# Patient Record
Sex: Female | Born: 1985 | Hispanic: No | Marital: Married | Smoking: Never smoker
Health system: Southern US, Community
[De-identification: ages and names within clinical notes are randomized; demographics above are authoritative.]

## PROBLEM LIST (undated history)

## (undated) ENCOUNTER — Inpatient Hospital Stay (HOSPITAL_COMMUNITY): Payer: Self-pay

## (undated) DIAGNOSIS — O24419 Gestational diabetes mellitus in pregnancy, unspecified control: Secondary | ICD-10-CM

## (undated) DIAGNOSIS — O039 Complete or unspecified spontaneous abortion without complication: Secondary | ICD-10-CM

## (undated) DIAGNOSIS — E119 Type 2 diabetes mellitus without complications: Secondary | ICD-10-CM

## (undated) DIAGNOSIS — R51 Headache: Secondary | ICD-10-CM

## (undated) HISTORY — DX: Gestational diabetes mellitus in pregnancy, unspecified control: O24.419

## (undated) HISTORY — DX: Complete or unspecified spontaneous abortion without complication: O03.9

## (undated) HISTORY — PX: TONSILLECTOMY: SUR1361

## (undated) HISTORY — DX: Type 2 diabetes mellitus without complications: E11.9

---

## 2010-01-25 DIAGNOSIS — O039 Complete or unspecified spontaneous abortion without complication: Secondary | ICD-10-CM

## 2010-01-25 HISTORY — DX: Complete or unspecified spontaneous abortion without complication: O03.9

## 2010-04-24 ENCOUNTER — Inpatient Hospital Stay (HOSPITAL_COMMUNITY)
Admission: AD | Admit: 2010-04-24 | Discharge: 2010-04-24 | Disposition: A | Discharge status: Home / Self Care | Payer: Self-pay | Source: Ambulatory Visit | Attending: Obstetrics & Gynecology | Admitting: Obstetrics & Gynecology

## 2010-04-24 DIAGNOSIS — O200 Threatened abortion: Secondary | ICD-10-CM | POA: Insufficient documentation

## 2010-04-24 LAB — POCT PREGNANCY, URINE: Preg Test, Ur: POSITIVE

## 2010-04-25 ENCOUNTER — Inpatient Hospital Stay (HOSPITAL_COMMUNITY): Payer: Self-pay

## 2010-04-25 LAB — URINALYSIS, ROUTINE W REFLEX MICROSCOPIC
Bilirubin Urine: NEGATIVE
Ketones, ur: NEGATIVE mg/dL
Nitrite: NEGATIVE
Specific Gravity, Urine: 1.02 (ref 1.005–1.030)
Urobilinogen, UA: 0.2 mg/dL (ref 0.0–1.0)
pH: 7 (ref 5.0–8.0)

## 2010-04-25 LAB — CBC
MCH: 27.9 pg (ref 26.0–34.0)
MCHC: 33.8 g/dL (ref 30.0–36.0)
MCV: 82.6 fL (ref 78.0–100.0)
Platelets: 257 10*3/uL (ref 150–400)
RBC: 4.3 MIL/uL (ref 3.87–5.11)
RDW: 13.6 % (ref 11.5–15.5)

## 2010-04-25 LAB — URINE MICROSCOPIC-ADD ON

## 2010-04-25 LAB — WET PREP, GENITAL
Trich, Wet Prep: NONE SEEN
Yeast Wet Prep HPF POC: NONE SEEN

## 2010-04-25 LAB — ABO/RH: ABO/RH(D): A POS

## 2010-04-26 ENCOUNTER — Inpatient Hospital Stay (HOSPITAL_COMMUNITY)
Admission: AD | Admit: 2010-04-26 | Discharge: 2010-04-26 | Disposition: A | Discharge status: Home / Self Care | Payer: Medicaid Other | Source: Ambulatory Visit | Attending: Family Medicine | Admitting: Family Medicine

## 2010-04-26 DIAGNOSIS — O021 Missed abortion: Secondary | ICD-10-CM | POA: Insufficient documentation

## 2010-04-26 LAB — URINE CULTURE: Colony Count: NO GROWTH

## 2010-04-26 LAB — CBC
MCH: 28.2 pg (ref 26.0–34.0)
MCHC: 33.9 g/dL (ref 30.0–36.0)
MCV: 83 fL (ref 78.0–100.0)
Platelets: 228 10*3/uL (ref 150–400)
RDW: 13.6 % (ref 11.5–15.5)

## 2010-04-27 LAB — GC/CHLAMYDIA PROBE AMP, GENITAL
Chlamydia, DNA Probe: NEGATIVE
GC Probe Amp, Genital: NEGATIVE

## 2010-05-13 ENCOUNTER — Encounter (INDEPENDENT_AMBULATORY_CARE_PROVIDER_SITE_OTHER): Payer: Self-pay | Admitting: Obstetrics and Gynecology

## 2010-05-13 DIAGNOSIS — O039 Complete or unspecified spontaneous abortion without complication: Secondary | ICD-10-CM

## 2010-05-14 NOTE — Group Therapy Note (Signed)
Marissa Carpenter, STANLY                 ACCOUNT NO.:  [id number]  MEDICAL RECORD NO.:  [id number]           PATIENT TYPE:  A  LOCATION:  [facility]                   FACILITY:  [facility]  PHYSICIAN:  Maylon Cos, CNM    DATE OF BIRTH:  11-Sep-1985  DATE OF SERVICE:  05/13/2010                                 CLINIC NOTE  REASON FOR TODAY'S VISIT:  Followup MAU for miscarriage.  HISTORY OF PRESENT ILLNESS:  The patient is a 25 year old Muslim female who presents approximately 3 weeks after being seen in Maternity Admissions for inevitable SAB at 8 weeks and 5 days.  At that time, she was seen by myself and was given Cytotec 800 mcg.  She is A positive. She reports that approximately 2-1/2 hours after taking the Cytotec in MAU that she passed a large amount of tissue.  Her bleeding continued for approximately 3 days after passing tissue and she has had no bleeding since.  She states that she is emotionally healing well and has good support of her husband.  She does desire future pregnancy but is not in any hurry.  PHYSICAL EXAMINATION:  GENERAL:  Marissa Carpenter is a pleasant female, in no apparent distress. VITAL SIGNS:  Her vital signs are stable.  Her pulse is 84, her blood pressure is 98/70, her weight is 161.9, her height is 63 inches. HEENT:  Grossly normal with good dentition. ABDOMEN:  Soft and nontender. GENITALIA:  External genitalia without lesions.  Mucous membranes are pink without lesions.  She has a shaved perineum.  Irregular rugae with moderate tone.  She has a smooth cervix with no lesions.  She has a scant amount of creamy discharge without odor.  Bimanual exam reveals no cervical motion tenderness.  Not enlarged, nontender uterus.  Not enlarged and nontender adnexa. EXTREMITIES:  Equal and reactive bilaterally without edema.  ASSESSMENT:  Complete spontaneous abortion with Cytotec, doing well.  PLAN:  We will obtain a quant today to assess for full completion.  The patient is  doing well and should follow up in 3 weeks to assess of quant if necessary.  The patient will be called with results.  The patient may follow up in GYN Clinic as needed for GYN care, encouraged to continue daily prenatal vitamins, encouraged to delay conception until 3 normal periods.  She verbalizes understanding of this plan and in agreement.          ______________________________ Maylon Cos, CNM    SS/MEDQ  D:  05/13/2010  T:  05/14/2010  Job:  [id number]

## 2010-05-26 DEATH — deceased

## 2010-06-03 ENCOUNTER — Other Ambulatory Visit: Payer: Self-pay

## 2010-06-05 ENCOUNTER — Other Ambulatory Visit: Payer: Self-pay

## 2010-06-05 DIAGNOSIS — Z0189 Encounter for other specified special examinations: Secondary | ICD-10-CM

## 2010-07-19 ENCOUNTER — Inpatient Hospital Stay (HOSPITAL_COMMUNITY)
Admission: AD | Admit: 2010-07-19 | Discharge: 2010-07-19 | Disposition: A | Discharge status: Home / Self Care | Payer: Medicaid Other | Source: Ambulatory Visit | Attending: Obstetrics & Gynecology | Admitting: Obstetrics & Gynecology

## 2010-07-19 ENCOUNTER — Inpatient Hospital Stay (HOSPITAL_COMMUNITY): Payer: Medicaid Other

## 2010-07-19 DIAGNOSIS — O2390 Unspecified genitourinary tract infection in pregnancy, unspecified trimester: Secondary | ICD-10-CM

## 2010-07-19 DIAGNOSIS — O208 Other hemorrhage in early pregnancy: Secondary | ICD-10-CM | POA: Insufficient documentation

## 2010-07-19 DIAGNOSIS — N390 Urinary tract infection, site not specified: Secondary | ICD-10-CM

## 2010-07-19 LAB — WET PREP, GENITAL: Yeast Wet Prep HPF POC: NONE SEEN

## 2010-07-19 LAB — URINE MICROSCOPIC-ADD ON

## 2010-07-19 LAB — URINALYSIS, ROUTINE W REFLEX MICROSCOPIC
Nitrite: NEGATIVE
Protein, ur: NEGATIVE mg/dL
Specific Gravity, Urine: >1.03 — ABNORMAL HIGH (ref 1.005–1.030)
Urobilinogen, UA: 0.2 mg/dL (ref 0.0–1.0)

## 2010-07-19 LAB — POCT PREGNANCY, URINE: Preg Test, Ur: POSITIVE

## 2010-07-20 LAB — URINE CULTURE: Colony Count: 25000

## 2010-07-21 LAB — GC/CHLAMYDIA PROBE AMP, GENITAL: Chlamydia, DNA Probe: NEGATIVE

## 2010-08-01 ENCOUNTER — Inpatient Hospital Stay (HOSPITAL_COMMUNITY): Payer: Self-pay | Admitting: Family Medicine

## 2010-08-01 ENCOUNTER — Inpatient Hospital Stay (HOSPITAL_COMMUNITY)
Admission: AD | Admit: 2010-08-01 | Discharge: 2010-08-01 | Disposition: A | Discharge status: Home / Self Care | Payer: Self-pay | Source: Ambulatory Visit | Attending: Family Medicine | Admitting: Family Medicine

## 2010-08-01 ENCOUNTER — Encounter (HOSPITAL_COMMUNITY): Payer: Self-pay

## 2010-08-01 DIAGNOSIS — O209 Hemorrhage in early pregnancy, unspecified: Secondary | ICD-10-CM

## 2010-08-01 NOTE — ED Provider Notes (Signed)
History     Chief Complaint  Patient presents with  . Vaginal Bleeding    bleeding started on Thursday lighter than a period  . Abdominal Pain   Patient is a 25 y.o. female presenting with vaginal bleeding and abdominal pain. The history is provided by the patient and the spouse. The history is limited by a language barrier.  Vaginal Bleeding This is a recurrent (Pt has known large subchorionic hemorrhage from U/S 6/24.) problem. The current episode started in the past 7 days. The problem occurs 2 to 4 times per day. The problem has been gradually worsening. Associated symptoms include abdominal pain. Pertinent negatives include no chills or fever. Associated symptoms comments: cramping. The symptoms are aggravated by nothing.  Abdominal Pain The primary symptoms of the illness include abdominal pain and vaginal bleeding. The primary symptoms of the illness do not include fever or dysuria.  Additional symptoms associated with the illness include heartburn. Symptoms associated with the illness do not include chills, urgency or frequency.       History reviewed. No pertinent past medical history.  History reviewed. No pertinent past surgical history.  No family history on file.  History  Substance Use Topics  . Smoking status: Never Smoker   . Smokeless tobacco: Not on file  . Alcohol Use: No    Allergies: No Known Allergies  Prescriptions prior to admission  Medication Sig Dispense Refill  . prenatal vitamin w/FE, FA (PRENATAL 1 + 1) 27-1 MG TABS Take 1 tablet by mouth daily.          Review of Systems  Constitutional: Negative for fever and chills.  Gastrointestinal: Positive for heartburn and abdominal pain.  Genitourinary: Positive for vaginal bleeding. Negative for dysuria, urgency and frequency.       Red bleeding   Physical Exam   Blood pressure 136/69, pulse 87, temperature 98.4 F (36.9 C), temperature source Oral, resp. rate 18, height 5\' 4"  (1.626 m), weight  174 lb (78.926 kg), last menstrual period 05/22/2010.  Physical Exam  Constitutional: She appears well-developed and well-nourished.  GI: Soft. She exhibits no mass. There is no rebound and no guarding.  Genitourinary: There is no rash, tenderness, lesion or injury on the right labia. There is no rash, tenderness, lesion or injury on the left labia. Uterus is enlarged. Right adnexum displays no mass. Left adnexum displays no mass. There is bleeding around the vagina.       Cervix closed, no active bleeding  Neurological: She is alert.  Skin: Skin is warm, dry and intact.  Psychiatric: Her mood appears anxious.    MAU Course  Procedures  MDM Bedside ultrasound + cardiac activity.Pt blood type is A+.  Pelvic rest, awaiting Medicaid to start [facility] with [facility].   Avon Gully. Mariely Mahr 08/01/10 1818  Avon Gully. Jasia Hiltunen 08/01/10 1824  Avon Gully. Catrina Fellenz 08/01/10 1830

## 2010-08-01 NOTE — ED Notes (Signed)
Unable to hear FHT's with doppler. 

## 2010-08-01 NOTE — Initial Assessments (Signed)
Prior to first prenatal appt., Pt. And husb. Advised to return to MAU if she experiences heavy bleeding or mod-severe pain.

## 2010-08-01 NOTE — Initial Assessments (Signed)
Pt. States she has bleeding that has increased, enough to come out on to pad or into toilet. Currently very small amt. Pt. States is less than what she observed earlier today.

## 2010-08-01 NOTE — ED Notes (Signed)
+   cardiac activity noted per bedside U/S by K. Tiburcio Pea, NP

## 2010-08-01 NOTE — Progress Notes (Signed)
Bleeding more today wearing the first pad put on

## 2010-08-03 ENCOUNTER — Inpatient Hospital Stay (HOSPITAL_COMMUNITY)
Admission: AD | Admit: 2010-08-03 | Discharge: 2010-08-03 | Disposition: A | Discharge status: Home / Self Care | Payer: Medicaid Other | Source: Ambulatory Visit | Attending: Obstetrics & Gynecology | Admitting: Obstetrics & Gynecology

## 2010-08-03 ENCOUNTER — Encounter (HOSPITAL_COMMUNITY): Payer: Self-pay | Admitting: *Deleted

## 2010-08-03 ENCOUNTER — Inpatient Hospital Stay (HOSPITAL_COMMUNITY): Admission: AD | Admit: 2010-08-03 | Payer: Self-pay | Source: Ambulatory Visit | Admitting: Obstetrics & Gynecology

## 2010-08-03 ENCOUNTER — Inpatient Hospital Stay (HOSPITAL_COMMUNITY): Payer: Medicaid Other

## 2010-08-03 DIAGNOSIS — O021 Missed abortion: Secondary | ICD-10-CM

## 2010-08-03 DIAGNOSIS — O200 Threatened abortion: Secondary | ICD-10-CM | POA: Insufficient documentation

## 2010-08-03 MED ORDER — PROMETHAZINE HCL 25 MG PO TABS: 25.0000 mg | ORAL_TABLET | Freq: Four times a day (QID) | ORAL | Status: AC | PRN

## 2010-08-03 MED ORDER — MISOPROSTOL 200 MCG PO TABS
800.0000 ug | ORAL_TABLET | Freq: Once | ORAL | Status: AC
  Administered 2010-08-03: 800 ug via VAGINAL
  Filled 2010-08-03: qty 4

## 2010-08-03 MED ORDER — OXYCODONE-ACETAMINOPHEN 5-325 MG PO TABS: 1.0000 | ORAL_TABLET | Freq: Four times a day (QID) | ORAL | Status: DC | PRN

## 2010-08-03 MED ORDER — IBUPROFEN 800 MG PO TABS: 800.0000 mg | ORAL_TABLET | Freq: Three times a day (TID) | ORAL | Status: AC | PRN

## 2010-08-03 MED ORDER — MISOPROSTOL 200 MCG PO TABS
ORAL_TABLET | ORAL | Status: AC
  Administered 2010-08-03: 800 ug via VAGINAL
  Filled 2010-08-03: qty 1

## 2010-08-03 NOTE — Progress Notes (Signed)
Was seen in MAU on Saturday for small amount bleeding.  Began bleeding more last night & this morning, feeling dizzy.  Pt has changed one saturated pad this a.m.

## 2010-08-03 NOTE — ED Notes (Signed)
Ultrasound reviewed.  SIUP at 9.2 wk size is visible with no fetal cardiac activity seen. CRL 16mm. Subchorionic collection visualized. Small CL on Right. This is a change from the last [place], indicating fetal demise.   These results were shared with patient and her husband, who translated. Both tearful. They wanted to know why this happened and I explained we do not know why. Discussed options of expectant management versus cytotec. Discussed possiblity of heavy bleeding and increased cramping. Patient and her husband wish to pursue Cytotec treatment. Pt is able to return via pvt car if needed. Last Hgb 12.9 in April.  Per Dr Macon Large, not necessary to repeat CBC, since pt has not had much blood loss to date.   Pt wishes to have cytotec placed here. Will d/c home with Rx for pain meds and nausea medicine. Will have her followup in clinic in 2-3 weeks.

## 2010-08-03 NOTE — Progress Notes (Signed)
Artelia Laroche CNM in to discuss U/S results with pt.

## 2010-08-03 NOTE — ED Provider Notes (Signed)
History     Chief Complaint  Patient presents with  . Vaginal Bleeding  [place] of 1 wk ago showed large [facility] Vaginal Bleeding The patient's primary symptoms include vaginal bleeding. The patient's pertinent negatives include no pelvic pain. This is a new problem. The current episode started yesterday. The problem occurs constantly. The problem has been gradually worsening. The pain is moderate. The problem affects both sides. She is pregnant. Associated symptoms include abdominal pain and back pain. Pertinent negatives include no dysuria.  Presents with increased bleeding since last night.  Pertinent Gynecological History:History reviewed. No pertinent past medical history.  History reviewed. No pertinent past surgical history.  No family history on file.  History  Substance Use Topics  . Smoking status: Never Smoker   . Smokeless tobacco: Not on file  . Alcohol Use: No    Allergies: No Known Allergies  Prescriptions prior to admission  Medication Sig Dispense Refill  . prenatal vitamin w/FE, FA (PRENATAL 1 + 1) 27-1 MG TABS Take 1 tablet by mouth daily.          Review of Systems  Gastrointestinal: Positive for abdominal pain.  Genitourinary: Positive for vaginal bleeding. Negative for dysuria and pelvic pain.  Musculoskeletal: Positive for back pain.   Physical Exam   Blood pressure 117/69, pulse 81, temperature 98.7 F (37.1 C), temperature source Oral, resp. rate 20, height 5' 3.25" (1.607 m), weight 79.379 kg (175 lb), last menstrual period 05/22/2010.  Physical Exam  Constitutional: She appears well-developed and well-nourished.  HENT:  Head: Normocephalic.  Cardiovascular: Normal rate.   Respiratory: She is in respiratory distress.  Genitourinary: Vagina normal.       Mod BRB on pad  Grieving appropraitely  MAU Course  Procedures  MDM A; 1st tr bleed. Probable SAB P:Rpt [place] today. Discussed probable SAB in progress. Above exam done by M. Williams CNM. Bernita Buffy  CNM did documentation only.

## 2010-08-03 NOTE — Progress Notes (Signed)
Pt states she was seen in MAU 7-7 with cramping and bleeding. States bleeding has increased and continues to have lower abdominal cramping. Pt has on a pad that has a 1cm/4cm area of dark red blood, no active bleeding.

## 2010-08-26 ENCOUNTER — Encounter: Payer: Self-pay | Admitting: Advanced Practice Midwife

## 2010-08-26 ENCOUNTER — Ambulatory Visit (INDEPENDENT_AMBULATORY_CARE_PROVIDER_SITE_OTHER): Payer: Self-pay | Admitting: Advanced Practice Midwife

## 2010-08-26 VITALS — BP 109/64 | HR 83 | Temp 98.7°F | Wt 170.4 lb

## 2010-08-26 DIAGNOSIS — O039 Complete or unspecified spontaneous abortion without complication: Secondary | ICD-10-CM

## 2010-08-26 HISTORY — DX: Complete or unspecified spontaneous abortion without complication: O03.9

## 2010-08-26 NOTE — Patient Instructions (Addendum)
Let us know if you want to do the labs for evaluation of recurrent miscarriage.   Wait at least one normal menstrual cycle prior to trying to get pregnant again.   Miscarriage (Spontaneous Miscarriage) A miscarriage is when you lose your baby before the twentieth week of pregnancy. Miscarriages happen in 15-20% of pregnancies. Most miscarriages happen in the first 13 weeks of the pregnancy. In medical terms, this is called a spontaneous miscarriage or early pregnancy loss. No further treatment is needed when the miscarriage is complete and all products of conception have been passed out of the body. You can begin trying for another pregnancy as soon as your caregiver says it is okay. CAUSES  Most causes are not known.  Genetic problems like abnormal, not enough or too many chromosomes.   Infection of the cervix or uterus.   An abnormal shaped uterus, fibroid tumors or congenital abnormalities.   Hormone problems.  Medical problems.   Incompetent cervix, the tissue in the cervix is not strong enough to hold the pregnancy.   Smoking, too much alcohol use and illegal drugs.   Trauma.   SYMPTOMS  Bleeding or spotting from the vagina.   Cramping of the lower abdomen.   Passing of fluid from the vagina with or without cramps or pain.   Passing fetal tissue.  TREATMENT  Sometimes no further treatment is necessary if you pass all the tissue in the uterus.   If partial parts of the fetus or placenta remain in the body (incomplete miscarriage), tissue left behind may become infected. Usually a D and C (Dilatation and Curettage) suction or scrapping of the uterus is necessary to remove the remaining tissue in uterus. The procedure is only done when your caregiver knows that there is no chance for the pregnancy to continue. This is determined by a physical exam, a negative pregnancy test, blood tests and perhaps an ultrasound revealing a dead fetus or no fetus developing because a problem  occurred at conception (when the sperm and egg unite).   Medications may be necessary, antibiotics if there is an infection or medications to contract the uterus if there is a lot of bleeding.   If you have Rh negative blood and your partner is Rh positive, you will need a Rho-gam shot (an immune globulin vaccine). This will protect your baby from having Rh blood problems in future pregnancies.  HOME CARE INSTRUCTIONS  Your caregiver may order bed rest (up to the bathroom only). He or she may allow you to continue light activity. You may need to make arrangements for the care of children and for any other responsibilities.   Keep track of the number of pads you use each day and how soaked  (saturated) they are. Record this information.   Do not use tampons. Do not douche or have sexual intercourse until approved by your caregiver.   Only take over-the-counter or prescription medicines for pain, discomfort or fever as directed by your caregiver.   Do not take aspirin because it can cause bleeding.   It is very important to keep all follow-up appointments for re-evaluations and continuing management.   Tell your caregiver if you are experiencing domestic violence.   Women who have an Rh negative blood type (i.e., A, B, AB, or O negative) need to receive a drug called Rh(D) immune globulin (RhoGam). This medicine helps protect future fetuses against problems that can occur if an Rh negative mother is carrying a baby who is Rh positive.  If you and/or your partner are having problems with guilt or grieving, talk to your caregiver or seek counseling to help you cope with the pregnancy loss. Allow enough time to grieve before trying to get pregnant again.  SEEK IMMEDIATE MEDICAL CARE IF:  You have severe cramps or pain in your stomach, back, or belly (abdomen).   You run an unexplained temperature 100 F (37.9 C) or higher (record these).   You pass large clots or tissue. Save any tissue  for your caregiver to inspect.   Your bleeding increases.   You become light-headed, weak or have fainting episodes.   You develop chills.  Document Released: 07/07/2000 Document Re-Released: 07/01/2009 [facility] Patient Information 2011 [place], [place].

## 2010-08-26 NOTE — Progress Notes (Signed)
  Subjective:    Patient ID: Marissa Carpenter, female    DOB: [birth year], 25 y.o.   MRN: [id number]  HPI G2P0020 s/p SAB at [redacted] wks EGA diagnosed in MAU on 08/03/10. Given Cytotec at that time. Bleeding x 8-9 days following cytotec, no bleeding or pain now. Pt is concerned about having 2 SAB consecutively, would like to investigate the cause of her miscarriages. States that her sister had a similar situation and was found to have a clotting disorder and required injections throughout her pregnancy to maintain the pregnancy. No one else in family has history of clotting disorders or pregnancy losses.    History reviewed. No pertinent past medical history.  Family History  Problem Relation Age of Onset  . Miscarriages / Stillbirths Sister   . Clotting disorder Sister       Review of Systems ROS Review of Systems - Negative except as noted in HPI     Objective:   Physical Exam  Nursing note and vitals reviewed. Constitutional: She is oriented to person, place, and time. She appears well-developed and well-nourished. No distress.  Cardiovascular: Normal rate.   Pulmonary/Chest: Effort normal.  Abdominal: Soft. There is no tenderness.  Musculoskeletal: Normal range of motion.  Neurological: She is alert and oriented to person, place, and time.  Skin: Skin is warm and dry.  Psychiatric: She has a normal mood and affect.   UPT negative       Assessment & Plan:  25 y.o.G3P0020 with complete SAB H/O SAB x 2 - pt is considering eval for recurrent SAB, I discussed with the patient and her husband that we typically do not initiate a work-up until 3 consecutive SAB. The pt is concerned d/t her sister's history of clotting disorder leading to SAB. The pt is self pay, so I provided her with a list of the thrombophilia labs we would usually order and the cost of each of those labs. She is going to decide if she would like to have the labs completed. She is going to attempt to find out from her sister  what specific clotting disorder she has been diagnosed with to see if she can be tested specifically for that problem.  Otherwise, she was counseled to wait at least 1 normal cycle prior to attempting to conceive again RTC PRN

## 2011-01-02 ENCOUNTER — Emergency Department (HOSPITAL_COMMUNITY)
Admission: EM | Admit: 2011-01-02 | Discharge: 2011-01-03 | Disposition: A | Discharge status: Home / Self Care | Payer: Self-pay | Attending: Emergency Medicine | Admitting: Emergency Medicine

## 2011-01-02 ENCOUNTER — Encounter (HOSPITAL_COMMUNITY): Payer: Self-pay | Admitting: *Deleted

## 2011-01-02 DIAGNOSIS — K219 Gastro-esophageal reflux disease without esophagitis: Secondary | ICD-10-CM | POA: Insufficient documentation

## 2011-01-02 DIAGNOSIS — R109 Unspecified abdominal pain: Secondary | ICD-10-CM | POA: Insufficient documentation

## 2011-01-02 NOTE — ED Notes (Signed)
Upper epigastric pain x 1 wk

## 2011-01-02 NOTE — ED Notes (Signed)
Pt c/o abd pain; nausea; diarrhea

## 2011-01-03 ENCOUNTER — Encounter (HOSPITAL_COMMUNITY): Payer: Self-pay | Admitting: *Deleted

## 2011-01-03 LAB — PREGNANCY, URINE: Preg Test, Ur: NEGATIVE

## 2011-01-03 MED ORDER — FAMOTIDINE 20 MG PO TABS: 20.0000 mg | ORAL_TABLET | Freq: Two times a day (BID) | ORAL | Status: DC

## 2011-01-03 MED ORDER — GI COCKTAIL ~~LOC~~
30.0000 mL | Freq: Once | ORAL | Status: AC
  Administered 2011-01-03: 30 mL via ORAL
  Filled 2011-01-03: qty 30

## 2011-01-03 MED ORDER — OMEPRAZOLE 20 MG PO CPDR: 20.0000 mg | DELAYED_RELEASE_CAPSULE | Freq: Every day | ORAL | Status: DC

## 2011-01-03 MED ORDER — FAMOTIDINE 20 MG PO TABS
20.0000 mg | ORAL_TABLET | Freq: Once | ORAL | Status: AC
  Administered 2011-01-03: 20 mg via ORAL
  Filled 2011-01-03: qty 1

## 2011-01-03 NOTE — ED Provider Notes (Signed)
History     CSN: [id number] Arrival date & time: 01/02/2011  9:12 PM   First MD Initiated Contact with Patient 01/03/11 0129      Chief Complaint  Patient presents with  . Abdominal Pain    (Consider location/radiation/quality/duration/timing/severity/associated sxs/prior treatment) Patient is a 25 y.o. female presenting with abdominal pain. The history is provided by the patient. No language interpreter was used.  Abdominal Pain The primary symptoms of the illness include abdominal pain. The primary symptoms of the illness do not include fever, fatigue, shortness of breath, nausea, vomiting, diarrhea or dysuria. The current episode started more than 2 days ago (1 week ago). The onset of the illness was gradual. The problem has been gradually worsening.  The abdominal pain began more than 2 days ago. The pain came on gradually. The abdominal pain has been gradually worsening since its onset. The abdominal pain is located in the epigastric region. The abdominal pain does not radiate. The abdominal pain is relieved by nothing. The abdominal pain is exacerbated by certain positions.  The patient states that she believes she is currently not pregnant. The patient has not had a change in bowel habit. Symptoms associated with the illness do not include chills, constipation, urgency, hematuria, frequency or back pain.    History reviewed. No pertinent past medical history.  History reviewed. No pertinent past surgical history.  Family History  Problem Relation Age of Onset  . Miscarriages / Stillbirths Sister   . Clotting disorder Sister     History  Substance Use Topics  . Smoking status: Never Smoker   . Smokeless tobacco: Not on file  . Alcohol Use: No    OB History    Grav Para Term Preterm Abortions TAB SAB Ect Mult Living   3    2  2    0      Review of Systems  Constitutional: Negative for fever, chills, activity change, appetite change and fatigue.  HENT: Negative for  congestion, sore throat, rhinorrhea, neck pain and neck stiffness.   Respiratory: Negative for cough, chest tightness and shortness of breath.   Cardiovascular: Negative for chest pain and palpitations.  Gastrointestinal: Positive for abdominal pain. Negative for nausea, vomiting, diarrhea and constipation.  Genitourinary: Negative for dysuria, urgency, frequency, hematuria and flank pain.  Musculoskeletal: Negative for back pain.  Neurological: Negative for dizziness, weakness, light-headedness, numbness and headaches.  All other systems reviewed and are negative.    Allergies  Review of patient's allergies indicates no known allergies.  Home Medications   Current Outpatient Rx  Name Route Sig Dispense Refill  . FAMOTIDINE 20 MG PO TABS Oral Take 1 tablet (20 mg total) by mouth 2 (two) times daily. 30 tablet 0  . OMEPRAZOLE 20 MG PO CPDR Oral Take 1 capsule (20 mg total) by mouth daily. 30 capsule 0    BP 103/76  Pulse 79  Temp(Src) 98.8 F (37.1 C) (Oral)  Resp 20  SpO2 100%  LMP 12/11/2010  Physical Exam  Nursing note and vitals reviewed. Constitutional: She is oriented to person, place, and time. She appears well-developed and well-nourished. No distress.  HENT:  Head: Normocephalic and atraumatic.  Mouth/Throat: Oropharynx is clear and moist. No oropharyngeal exudate.  Eyes: Conjunctivae and EOM are normal. Pupils are equal, round, and reactive to light.  Neck: Normal range of motion. Neck supple.  Cardiovascular: Normal rate, regular rhythm, normal heart sounds and intact distal pulses.  Exam reveals no gallop and no friction rub.  No murmur heard. Pulmonary/Chest: Effort normal and breath sounds normal. No respiratory distress.  Abdominal: Soft. Bowel sounds are normal. There is no tenderness. There is no rebound and no guarding.  Musculoskeletal: Normal range of motion. She exhibits no tenderness.  Neurological: She is alert and oriented to person, place, and time.   Skin: Skin is warm and dry. No rash noted.    ED Course  Procedures (including critical care time)   Labs Reviewed  PREGNANCY, URINE   No results found.   1. GERD (gastroesophageal reflux disease)       MDM  Symptoms are consistent with gastroesophageal reflux disease. There is no concern for pancreatitis or additional causes of malignant abdominal pain. I performed a urine pregnancy test per the patient's request. This was negative. She received a GI cocktail and Pepcid the emergency department. She was discharged home with Prilosec and Pepcid instructions to followup with her primary care physician.        Dayton Bailiff, MD 01/03/11 [id number]

## 2011-01-15 ENCOUNTER — Encounter: Payer: Self-pay | Admitting: Family Medicine

## 2011-01-15 ENCOUNTER — Ambulatory Visit (INDEPENDENT_AMBULATORY_CARE_PROVIDER_SITE_OTHER): Payer: Self-pay | Admitting: Family Medicine

## 2011-01-15 DIAGNOSIS — R198 Other specified symptoms and signs involving the digestive system and abdomen: Secondary | ICD-10-CM

## 2011-01-15 DIAGNOSIS — Z8759 Personal history of other complications of pregnancy, childbirth and the puerperium: Secondary | ICD-10-CM

## 2011-01-15 DIAGNOSIS — Z8742 Personal history of other diseases of the female genital tract: Secondary | ICD-10-CM

## 2011-01-15 DIAGNOSIS — R21 Rash and other nonspecific skin eruption: Secondary | ICD-10-CM | POA: Insufficient documentation

## 2011-01-15 DIAGNOSIS — K219 Gastro-esophageal reflux disease without esophagitis: Secondary | ICD-10-CM | POA: Insufficient documentation

## 2011-01-15 DIAGNOSIS — E669 Obesity, unspecified: Secondary | ICD-10-CM

## 2011-01-15 DIAGNOSIS — B354 Tinea corporis: Secondary | ICD-10-CM

## 2011-01-15 DIAGNOSIS — R197 Diarrhea, unspecified: Secondary | ICD-10-CM

## 2011-01-15 DIAGNOSIS — I10 Essential (primary) hypertension: Secondary | ICD-10-CM

## 2011-01-15 LAB — LIPID PANEL
HDL: 42 mg/dL (ref >39)
LDL Cholesterol: 110 mg/dL — ABNORMAL HIGH (ref 0–99)
Total CHOL/HDL Ratio: 4.8 Ratio
Triglycerides: 251 mg/dL — ABNORMAL HIGH (ref <150)

## 2011-01-15 MED ORDER — FLUCONAZOLE 150 MG PO TABS: 150.0000 mg | ORAL_TABLET | ORAL | Status: AC

## 2011-01-15 MED ORDER — OXICONAZOLE NITRATE 1 % EX CREA: 1.0000 | TOPICAL_CREAM | Freq: Two times a day (BID) | CUTANEOUS | Status: DC

## 2011-01-15 NOTE — Assessment & Plan Note (Signed)
Post-prandial epigastric pain past few weeks alleviated by Pepcid and Prilosec. Since she still has occasional discomfort and persistent mild nausea, advised her to take Prilosec on a regular basis for the next month and asked her to follow-up at that time.  Will check TSH and FLP today.

## 2011-01-15 NOTE — Progress Notes (Signed)
  Subjective:    Patient ID: Marissa Carpenter, female    DOB: [birth year], 25 y.o.   MRN: [id number]  HPI This is a 25 YO [place] female who speaks primary Arabic (and needs English interpretor) presenting for her initial visit.   1. GERD Went to the hospital recently and diagnosed with this. Symptoms: epigastric pain after eating x few weeks; nausea x 1 month usually associated with eating but sometimes not; lightheadedness Alleviated by: Pepcid and Prilosec which she stopped taking due to improvement of symptoms Progression: improved  2. Circular red lesion on back of neck Duration: 2 months Progression: she feels like it is growing Symptoms: itchy ROS: denies fever; no other lesions  Review of Systems Per HPI    Objective:   Physical Exam Gen: NAD, overweight, accompanied by husband Psych: pleasant, seems to understand what I am saying and responds occasionally but defers most responses to husband who is pleasant CV: RRR, no m/r/g Pulm: CTAB, no w/r/r Abd: NABS, soft, mild lower abdominal tenderness bilaterally (this has been going on "for a while") without epigastric tenderness or guarding/rebound Ext: no edema Skin: annular, erythematous, non-scaly lesion with area of central clearing at left nape of neck, slightly raised    Assessment & Plan:

## 2011-01-15 NOTE — Assessment & Plan Note (Signed)
Annular erythematous pruritic lesion left nape of neck past few months. Consistent with and will treat as tinea corporis with topical and systemic anti-fungals. Follow-up in 1 month.

## 2011-01-15 NOTE — Patient Instructions (Signed)
I think the rash on your neck is due to fungal infection. Take the pill once a week for 2-4 weeks (until the rash goes away).  Use the cream twice a day until the rash goes away.  Stop the Pepcid but take the Prilosec every day for the next month.  Follow-up with me in 3-4 weeks for a pap smear and to follow-up on the rash. We will talk about your lab results at that time (if they are normal). If they are abnormal, I will call you.

## 2011-01-28 ENCOUNTER — Ambulatory Visit: Payer: Self-pay | Admitting: Family Medicine

## 2011-02-18 ENCOUNTER — Ambulatory Visit: Payer: Self-pay | Admitting: Family Medicine

## 2011-03-04 ENCOUNTER — Ambulatory Visit (INDEPENDENT_AMBULATORY_CARE_PROVIDER_SITE_OTHER): Payer: Self-pay | Admitting: Family Medicine

## 2011-03-04 ENCOUNTER — Encounter: Payer: Self-pay | Admitting: Family Medicine

## 2011-03-04 ENCOUNTER — Other Ambulatory Visit (HOSPITAL_COMMUNITY)
Admission: RE | Admit: 2011-03-04 | Discharge: 2011-03-04 | Disposition: A | Discharge status: Home / Self Care | Payer: Self-pay | Source: Ambulatory Visit | Attending: Family Medicine | Admitting: Family Medicine

## 2011-03-04 VITALS — BP 108/69 | HR 80 | Temp 98.1°F | Wt 181.0 lb

## 2011-03-04 DIAGNOSIS — Z23 Encounter for immunization: Secondary | ICD-10-CM

## 2011-03-04 DIAGNOSIS — Z124 Encounter for screening for malignant neoplasm of cervix: Secondary | ICD-10-CM

## 2011-03-04 DIAGNOSIS — Z0000 Encounter for general adult medical examination without abnormal findings: Secondary | ICD-10-CM | POA: Insufficient documentation

## 2011-03-04 DIAGNOSIS — B354 Tinea corporis: Secondary | ICD-10-CM

## 2011-03-04 DIAGNOSIS — Z01419 Encounter for gynecological examination (general) (routine) without abnormal findings: Secondary | ICD-10-CM | POA: Insufficient documentation

## 2011-03-04 DIAGNOSIS — E785 Hyperlipidemia, unspecified: Secondary | ICD-10-CM | POA: Insufficient documentation

## 2011-03-04 MED ORDER — HYDROCORTISONE 0.5 % EX OINT: TOPICAL_OINTMENT | Freq: Two times a day (BID) | CUTANEOUS | Status: DC

## 2011-03-04 NOTE — Assessment & Plan Note (Signed)
Mildly elevated. No other risk factors.  No medications at this time.  Lifestyle modifications. Encouraged 10% (10-18 pound) weight loss.

## 2011-03-04 NOTE — Assessment & Plan Note (Signed)
Pap today.  Meeting with Rosalita Chessman to discuss elevated cholesterol and lifestyle modification. Patient seems interested.

## 2011-03-04 NOTE — Progress Notes (Signed)
OBESITY Current weight/BMI : 31.05   How long have been obese:  2 + yrs Course:  Somewhat worsening Problems or symptoms it causes:  Pre-hyperlipidemia   Things have tried to improve:  To be determined  Patient Identified Concern: high cholesterol Stage of Change Patient Is In:  Contemplations, will make changes in next 6 months. Patient Reported Barriers:  Language, lack of knowledge, habits Patient Reported Perceived Benefits:  Better health Patient Reports Self-Efficacy:   To be determined Behavior Change Supports:  Husband will help her make changes Goals:  To limit fried foods, focus on eating vegetables, lean proteins, drinking more water, limiting soda/teas, and to move more.  Patient Education:  We discussed what her total, ldl, hdl levels where and ways to improve her nubmers including diet, exercise, fish oil supplements.

## 2011-03-04 NOTE — Progress Notes (Signed)
  Subjective:    Patient ID: Marissa Carpenter, female    DOB: 09-Feb-1985, 26 y.o.   MRN: [id number]  HPI Follow-up of neck rash.  Still itchy but thinks rash is going down. Using oxiconazole almost every day, twice a day. Finished 1 month, q week course of Diflucan.  2. Pap smear. Wants to get pregnant. Regular monthly periods. Last period middle of last month.  No history of abnormal Paps.   Review of Systems    Objective:   Physical Exam Gen: NAD Psych: pleasant, accompanied by husband Skin: lesion is less annular and erythematous and more irregular, fainter border on left-side; non-scaly; no drainage; about 3 x 2 cm GU: vulva, vagina normal; cervix has lighter area in os; some bleeding with Pap exam    Assessment & Plan:

## 2011-03-04 NOTE — Patient Instructions (Signed)
For the rash, try the hydrocortisone ointment twice daily until the rash goes away.  Also use vaseline on top of the ointment twice daily.  I think you have eczema.  If your lab results are normal, I will send you a letter with the results. If abnormal, someone at the clinic will get in touch with you.   Follow-up in 2-4 weeks if your rash does not improve or gets worse.  Otherwise, follow-up in 1 year for your next physical. We will check your cholesterol at that time.   Meet with our Health Coach Arlys John about ways to reduce your cholesterol.

## 2011-03-04 NOTE — Assessment & Plan Note (Signed)
May be eczema. Anti-fungals topical and systemic helped some but itchiness persistent.  Low-dose hydrocortisone ointment (0.5%), vaseline bid.  Follow-up in 2-4 weeks if not improved.

## 2011-03-05 ENCOUNTER — Encounter: Payer: Self-pay | Admitting: Family Medicine

## 2011-04-09 ENCOUNTER — Encounter: Payer: Self-pay | Admitting: Family Medicine

## 2011-04-09 ENCOUNTER — Other Ambulatory Visit (HOSPITAL_COMMUNITY)
Admission: RE | Admit: 2011-04-09 | Discharge: 2011-04-09 | Disposition: A | Discharge status: Home / Self Care | Payer: Self-pay | Source: Ambulatory Visit | Attending: Family Medicine | Admitting: Family Medicine

## 2011-04-09 ENCOUNTER — Ambulatory Visit (INDEPENDENT_AMBULATORY_CARE_PROVIDER_SITE_OTHER): Payer: Self-pay | Admitting: Family Medicine

## 2011-04-09 VITALS — BP 111/72 | HR 80 | Temp 98.1°F | Ht 64.0 in | Wt 182.0 lb

## 2011-04-09 DIAGNOSIS — N390 Urinary tract infection, site not specified: Secondary | ICD-10-CM

## 2011-04-09 DIAGNOSIS — Z113 Encounter for screening for infections with a predominantly sexual mode of transmission: Secondary | ICD-10-CM | POA: Insufficient documentation

## 2011-04-09 DIAGNOSIS — N912 Amenorrhea, unspecified: Secondary | ICD-10-CM

## 2011-04-09 DIAGNOSIS — R109 Unspecified abdominal pain: Secondary | ICD-10-CM

## 2011-04-09 LAB — POCT URINALYSIS DIPSTICK
Bilirubin, UA: NEGATIVE
Blood, UA: NEGATIVE
Ketones, UA: NEGATIVE
Nitrite, UA: NEGATIVE
Protein, UA: NEGATIVE
pH, UA: 7

## 2011-04-09 MED ORDER — NITROFURANTOIN MONOHYD MACRO 100 MG PO CAPS: 100.0000 mg | ORAL_CAPSULE | Freq: Two times a day (BID) | ORAL | Status: AC

## 2011-04-09 NOTE — Assessment & Plan Note (Signed)
It was difficult understanding why patient was here today. I evaluated her for a possible UTI versus vaginitis, however, I think her real reason was concern about whether her having periods every 5-weeks instead of every 4-weeks put her at increased risk of spontaneous abortions, which have happened a few times to her. I advised her to make a visit with me to discuss her wanting to get pregnant since I was not able to elicit this until the end of the visit. I think she may have a possible urinary tract infection. Positive leuk esterase but negative nitrates. Her wet prep (read by me, so not as accurate as usual when read by lab technician) showed significant GNR and numerous WBC. Due to her nausea and suprapubic pain, I will treat as UTI with Macrobid x 7 days. Will also get urine culture.

## 2011-04-09 NOTE — Progress Notes (Signed)
  Subjective:    Patient ID: Marissa Carpenter, female    DOB: 09/06/1985, 26 y.o.   MRN: [id number]  HPI Acute visit: right lower pelvic pain, concerned period may not be coming Last period February 24th.  Period every 5 weeks.  Was wondering if we could tell her if her period is going to come.  Also having right-sided pelvic pain. Concerned about infection.  Sexually active with husband only. Some greenish colored discharge. No vaginal irritation.  No fevers. Episode of nausea with vomiting sputum 2 days ago. No problems taking PO now.   Review of Systems Per HPI    Objective:   Physical Exam Gen: NAD, accompanied by interpreter Abd: NABS, soft, mild TTP focal area right lower abdomen, right upper pelvis GU: vulva, vagina normal without erythema, scant white discharge in posterior vault, no CVA tenderness, no cervical discharge including pus or blood    Assessment & Plan:

## 2011-04-09 NOTE — Patient Instructions (Signed)
I think you may have a urine infection.  Take the antibiotic twice a day for 7 days.  If you would to talk about getting pregnant, please make an appointment to discuss this. Otherwise follow-up as needed.

## 2011-04-11 LAB — URINE CULTURE

## 2011-04-14 ENCOUNTER — Encounter: Payer: Self-pay | Admitting: Family Medicine

## 2011-04-28 ENCOUNTER — Telehealth: Payer: Self-pay | Admitting: Family Medicine

## 2011-04-28 NOTE — Telephone Encounter (Signed)
Husband is calling because his wife was in 3/15 and was told that they would receive a call about some more testing that needed to be done, but no one has called them back yet.

## 2011-04-29 NOTE — Telephone Encounter (Signed)
Spoke with patient's husband. At the last visit, I asked patient to call and make an appointment with me to discuss her infertility since she mentioned this concern at the end of her visit.  I asked patient's husband is referral to Ob/Gyn preferred, but he said they would follow-up with me and would call and make appointment.

## 2011-04-30 ENCOUNTER — Ambulatory Visit (INDEPENDENT_AMBULATORY_CARE_PROVIDER_SITE_OTHER): Payer: Self-pay | Admitting: Family Medicine

## 2011-04-30 VITALS — BP 108/72 | HR 78 | Temp 98.6°F | Ht 64.0 in | Wt 184.2 lb

## 2011-04-30 DIAGNOSIS — N96 Recurrent pregnancy loss: Secondary | ICD-10-CM | POA: Insufficient documentation

## 2011-04-30 DIAGNOSIS — N979 Female infertility, unspecified: Secondary | ICD-10-CM

## 2011-04-30 MED ORDER — PRENATAL VITAMINS (DIS) PO TABS: 1.0000 | ORAL_TABLET | Freq: Every day | ORAL | Status: DC

## 2011-04-30 NOTE — Patient Instructions (Addendum)
Make an appointment next week to discuss your lab results.  Start taking a prenatal vitamins.

## 2011-04-30 NOTE — Progress Notes (Signed)
  Subjective:    Patient ID: Marissa Carpenter, female    DOB: 11/30/1985, 26 y.o.   MRN: [id number]  HPI Initial visit due to concern about ability to have children.  Patient has had 2 recent miscarriages.  She has a sister who has a blood disorder that required injections during her pregnancy.  Patient has never been on birth control and has been actively trying to conceive. She and her husband have intercourse 3-4 times a week.   Review of Systems    Objective:   Physical Exam Gen: NAD; overweight; accompanied by husband HEENT: no facial hair or acne CV: RRR Pulm: NI WOB Abd: soft, NT, obese, non-distended Ext: no edema Skin: no rash; warm, dry    Assessment & Plan:

## 2011-04-30 NOTE — Assessment & Plan Note (Addendum)
Summary of obstetrical history:   04/24/2010 G1 @ ~9wks. Presented to [facility] and diagnosed with inevitable miscarriage.   07/19/2010 G2P0010 @ 8wks. Presented to [facility], and U/S showed subchorionic hemorrhage.   08/03/2010 Spontaneous abortion. Patient has been seen at [facility] following her second spontaneous abortion. The patient has a sister with a blood disorder requiring injections during pregnancy, and since the patient had limited finances, a thrombophilia work-up was not done at that time. The plan was for the patient to ask her sister regarding her diagnosis to narrow the work-up laboratory work to make it more financially feasible for the patient and her family.  Patient presents today due to concerns about the ability to have children. The patient is not infertile. She has conceived twice and has regular monthly periods. Of note, semen analysis of her husband was also normal; TSH, HgbA1c were also WNL recently. She does not smoke tobacco and does not have signs of PCOS.  Will get thrombophilia work-up today since patient now has orange card>>>normal except for elevated protein C. 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   Follow-up in 1 week to discuss results.

## 2011-05-05 LAB — HYPERCOAGULABLE PANEL, COMPREHENSIVE
AntiThromb III Func: 102 % (ref 76–126)
Anticardiolipin IgG: 4 GPL U/mL (ref <23)
Beta-2 Glyco I IgG: 4 G Units (ref <20)
Beta-2-Glycoprotein I IgA: 3 A Units (ref <20)
Beta-2-Glycoprotein I IgM: 12 M Units (ref <20)
Lupus Anticoagulant: NOT DETECTED
Protein C, Total: 131 % (ref 72–160)
Protein S Total: 102 % (ref 60–150)

## 2011-05-07 ENCOUNTER — Encounter: Payer: Self-pay | Admitting: Family Medicine

## 2011-05-07 ENCOUNTER — Ambulatory Visit (INDEPENDENT_AMBULATORY_CARE_PROVIDER_SITE_OTHER): Payer: Self-pay | Admitting: Family Medicine

## 2011-05-07 VITALS — BP 123/77 | HR 98 | Ht 64.0 in | Wt 181.0 lb

## 2011-05-07 DIAGNOSIS — N96 Recurrent pregnancy loss: Secondary | ICD-10-CM

## 2011-05-07 NOTE — Assessment & Plan Note (Addendum)
This is a 26 year old female from [place] who presents today for follow-up of her thrombophilia work-up, which was negative.  The only abnormality was an elevated protein C, which does not increase (and should actually decrease) her risk of VTE. The patient's family history of significant for mother and one sister with two spontaneous abortions each and a sister with thyroid problems. The patient's TSH done 08/2010 was WNL at 1.6. Of note, her lupus anticoagulant, HgbA1c, and INR were also WNL. Also, her husband' semen analysis performed at [facility] on 09/2010 showed adequate sperm. Informally discussed this patient with Ob/Gyn attending Dr. Jolayne Panther who recommended monitoring the patient during next pregnancy versus referring the patient and her husband for genetic testing. Discussed options with patient who would like genetic testing done at this time. The earliest appointment that can be made is September 2013. Patient does not want contraception in the interim. Follow-up prn.

## 2011-05-07 NOTE — Patient Instructions (Addendum)
We will refer you to see Maternal Fetal Medicine for genetic evaluation and will call you with the appointment.

## 2011-05-07 NOTE — Progress Notes (Signed)
  Subjective:    Patient ID: Marissa Carpenter, female    DOB: 09/08/1985, 26 y.o.   MRN: [id number]  HPI This is a 26 year old female with [place] who presents today for follow-up of her thrombophilia work-up, which was negative.  No new complaints.   Patient has a family history of spontaneous abortion in her mother and sister who lost 2 pregnancies in utero. Her mother ended up having 5 children. Her sister was able to carry subsequent deliveries to full term after receiving "injections" during her pregnancy. The patient is unsure what the medication was and the name of her sister's condition. The patient has 2 other sisters who are healthy and have had no problems during their pregnancies or deliveries. She also has 2 brothers whose wives have not lost any pregnancies.  The patient's family history is also significant for thyroid problems in a sister.  The patient has no history of abdominal surgeries.   Review of Systems    Objective:   Physical Exam Gen: NAD; accompanied by [place] interpreter Psych: engaged, appropriate, not anxious or depressed appearing    Assessment & Plan:

## 2011-05-25 ENCOUNTER — Emergency Department (HOSPITAL_COMMUNITY): Payer: No Typology Code available for payment source

## 2011-05-25 ENCOUNTER — Encounter (HOSPITAL_COMMUNITY): Payer: Self-pay

## 2011-05-25 ENCOUNTER — Emergency Department (HOSPITAL_COMMUNITY)
Admission: EM | Admit: 2011-05-25 | Discharge: 2011-05-25 | Disposition: A | Discharge status: Home / Self Care | Payer: No Typology Code available for payment source | Attending: Emergency Medicine | Admitting: Emergency Medicine

## 2011-05-25 DIAGNOSIS — M546 Pain in thoracic spine: Secondary | ICD-10-CM | POA: Insufficient documentation

## 2011-05-25 DIAGNOSIS — T148XXA Other injury of unspecified body region, initial encounter: Secondary | ICD-10-CM | POA: Insufficient documentation

## 2011-05-25 DIAGNOSIS — M545 Low back pain, unspecified: Secondary | ICD-10-CM | POA: Insufficient documentation

## 2011-05-25 DIAGNOSIS — R1084 Generalized abdominal pain: Secondary | ICD-10-CM | POA: Insufficient documentation

## 2011-05-25 DIAGNOSIS — M25559 Pain in unspecified hip: Secondary | ICD-10-CM | POA: Insufficient documentation

## 2011-05-25 DIAGNOSIS — M25519 Pain in unspecified shoulder: Secondary | ICD-10-CM | POA: Insufficient documentation

## 2011-05-25 DIAGNOSIS — M542 Cervicalgia: Secondary | ICD-10-CM | POA: Insufficient documentation

## 2011-05-25 LAB — POCT PREGNANCY, URINE: Preg Test, Ur: NEGATIVE

## 2011-05-25 LAB — POCT I-STAT, CHEM 8
BUN: 13 mg/dL (ref 6–23)
Calcium, Ion: 1.23 mmol/L (ref 1.12–1.32)
Chloride: 104 mEq/L (ref 96–112)
HCT: 39 % (ref 36.0–46.0)
Potassium: 4.1 mEq/L (ref 3.5–5.1)
Sodium: 139 mEq/L (ref 135–145)

## 2011-05-25 MED ORDER — CYCLOBENZAPRINE HCL 10 MG PO TABS: 10.0000 mg | ORAL_TABLET | Freq: Two times a day (BID) | ORAL | Status: AC | PRN

## 2011-05-25 MED ORDER — IOHEXOL 300 MG/ML  SOLN
100.0000 mL | Freq: Once | INTRAMUSCULAR | Status: AC | PRN
  Administered 2011-05-25: 100 mL via INTRAVENOUS

## 2011-05-25 MED ORDER — HYDROCODONE-ACETAMINOPHEN 5-500 MG PO TABS: 1.0000 | ORAL_TABLET | Freq: Four times a day (QID) | ORAL | Status: AC | PRN

## 2011-05-25 MED ORDER — OXYCODONE-ACETAMINOPHEN 5-325 MG PO TABS
1.0000 | ORAL_TABLET | Freq: Once | ORAL | Status: AC
  Administered 2011-05-25: 1 via ORAL
  Filled 2011-05-25: qty 1
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
## 2011-05-25 NOTE — ED Notes (Signed)
No bruising, abrasions, lacerations, or deformities noted to patient.  Patient ambulated to bathroom without difficulty, but is reporting neck, thoracic, and lumbar back pain.  Patient speaks little english but family is at bedside translating.
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
## 2011-05-27 ENCOUNTER — Ambulatory Visit (HOSPITAL_COMMUNITY)
Admission: RE | Admit: 2011-05-27 | Discharge: 2011-05-27 | Disposition: A | Discharge status: Home / Self Care | Payer: No Typology Code available for payment source | Source: Ambulatory Visit | Attending: Family Medicine | Admitting: Family Medicine

## 2011-05-27 DIAGNOSIS — IMO0002 Reserved for concepts with insufficient information to code with codable children: Secondary | ICD-10-CM | POA: Insufficient documentation

## 2011-05-27 DIAGNOSIS — N96 Recurrent pregnancy loss: Secondary | ICD-10-CM | POA: Insufficient documentation

## 2011-05-27 LAB — PERIPHERAL BLOOD ROUTINE CHROMOSOME - KARYOTYPE

## 2011-06-02 ENCOUNTER — Other Ambulatory Visit: Payer: Self-pay

## 2011-06-07 ENCOUNTER — Telehealth (HOSPITAL_COMMUNITY): Payer: Self-pay | Admitting: MS"

## 2011-06-07 NOTE — Telephone Encounter (Signed)
Talked with patient's husband, Caesar Chestnut, regarding results of peripheral blood chromosome analysis for the couple. Discussed that this revealed normal chromosomes for each: 9, XX for Shaday Rosekrans and 48, XY for [facility]. Reviewed that this indicates that an underlying chromosome rearrangement in either partner is not the underlying cause for miscarriages. Mr. Fonda Kinder asked what testing was available following this result. We reviewed that we had also talked about the availability of sonohysterography. The patient planned to contact their primary physician regarding this testing. We are able to facilitate, if desired.   Quinn Plowman, MS Certified Genetic Counselor 06/07/2011  10:00 AM

## 2011-06-08 ENCOUNTER — Other Ambulatory Visit: Payer: Self-pay | Admitting: Chiropractic Medicine

## 2011-06-08 ENCOUNTER — Ambulatory Visit
Admission: RE | Admit: 2011-06-08 | Discharge: 2011-06-08 | Disposition: A | Discharge status: Home / Self Care | Payer: Self-pay | Source: Ambulatory Visit | Attending: Chiropractic Medicine | Admitting: Chiropractic Medicine

## 2011-06-08 DIAGNOSIS — R52 Pain, unspecified: Secondary | ICD-10-CM

## 2011-06-08 DIAGNOSIS — T148XXA Other injury of unspecified body region, initial encounter: Secondary | ICD-10-CM

## 2011-06-22 ENCOUNTER — Ambulatory Visit (INDEPENDENT_AMBULATORY_CARE_PROVIDER_SITE_OTHER): Payer: Self-pay | Admitting: Family Medicine

## 2011-06-22 ENCOUNTER — Encounter: Payer: Self-pay | Admitting: Family Medicine

## 2011-06-22 VITALS — BP 113/74 | HR 87 | Temp 99.0°F | Ht 64.0 in | Wt 185.8 lb

## 2011-06-22 DIAGNOSIS — Z319 Encounter for procreative management, unspecified: Secondary | ICD-10-CM | POA: Insufficient documentation

## 2011-06-22 DIAGNOSIS — N979 Female infertility, unspecified: Secondary | ICD-10-CM

## 2011-06-22 NOTE — Assessment & Plan Note (Addendum)
MFM recommended sonohysterography when they consulted on patient since genetic work-up negative.  RN Clydie Braun Corneliussen who saw patient is currently not in office [phone]) until Thursday. Will call her at that time to confirm that this test is desired over HSG>>>Spoke with Clydie Braun 05/31. She consulted at MFM regarding testing. Sonohysterogram is better tolerated and shows similar anatomy to HSG. Will order former.  Patient's last LMP was 05/30/2011. Sonohysterography is preferred in follicular phase of menstrual period.

## 2011-06-22 NOTE — Progress Notes (Signed)
  Subjective:    Patient ID: Marissa Carpenter, female    DOB: 10/31/1985, 26 y.o.   MRN: [id number]  HPI Follow-up: desire to become pregnant Patient seen by MFM for genetic work-up. Negative. Recommended sonohysterography at that time if they wanted to pursue work-up. Patient and husband are here because they would like this test ordered.  Patient's LMP 05/30/2011.  Has periods every 4-5 weeks. Trying to get pregnant for the past several months. Intercourse 5 times a week.   Review of Systems Denies vaginal discharge, abnormal vaginal bleeding Denies lightheadedness/nausea Denies depression although she feel stressed sometimes because she desires children  Past Medical History, Family History, and Social History reviewed.     Objective:   Physical Exam Gen: NAD; accompanied by husband who translate for her, although she is able to understand some [place] Psych: engaged, appropriate, alert and oriented; not anxious or depressed appearing CV: RRR, no m/r/g Pulm: NI WOB Abd: non-tender    Assessment & Plan:

## 2011-06-22 NOTE — Patient Instructions (Signed)
We will call you regarding the test.  If you do not hear from [place] by Friday afternoon, please give [place] a call.   Follow-up in 2-3 months.

## 2011-07-08 ENCOUNTER — Telehealth (HOSPITAL_COMMUNITY): Payer: Self-pay | Admitting: MS"

## 2011-07-08 NOTE — Telephone Encounter (Signed)
Patient's husband, Marissa Carpenter called to inquire about sonohysterogram being scheduled. Stated that they talked with Dr. Madolyn Frieze who was planning to get this testing scheduled, and [name] wanted to know if this was being scheduled in our office. Explained that the testing would not be scheduled in our office. In [name]'s chart, there is an order placed for it to be scheduled but the sonohysterogram has not yet been scheduled. Encouraged [name] or [name] to contact [name] office, if they had additional questions about the scheduling of this test.   [name]

## 2011-07-14 ENCOUNTER — Telehealth: Payer: Self-pay | Admitting: Family Medicine

## 2011-07-14 NOTE — Telephone Encounter (Signed)
Pt is supposed to have a test done and needs to talk to nurse

## 2011-07-15 NOTE — Telephone Encounter (Signed)
Attempted to call pt, but the number has been disconnected.  If pt calls back please find out what the question is.  Pt is seen by a Dentist and test is probably something they ordered. Jaret Coppedge, Maryjo Rochester

## 2011-08-03 ENCOUNTER — Ambulatory Visit (HOSPITAL_COMMUNITY): Admission: RE | Admit: 2011-08-03 | Payer: Self-pay | Source: Ambulatory Visit

## 2011-08-03 ENCOUNTER — Other Ambulatory Visit: Payer: Self-pay | Admitting: Family Medicine

## 2011-08-03 ENCOUNTER — Telehealth: Payer: Self-pay | Admitting: Family Medicine

## 2011-08-03 ENCOUNTER — Ambulatory Visit (HOSPITAL_COMMUNITY)
Admission: RE | Admit: 2011-08-03 | Discharge: 2011-08-03 | Disposition: A | Discharge status: Home / Self Care | Payer: Self-pay | Source: Ambulatory Visit | Attending: Family Medicine | Admitting: Family Medicine

## 2011-08-03 DIAGNOSIS — N979 Female infertility, unspecified: Secondary | ICD-10-CM

## 2011-08-03 DIAGNOSIS — N96 Recurrent pregnancy loss: Secondary | ICD-10-CM | POA: Insufficient documentation

## 2011-08-03 MED ORDER — IOHEXOL 300 MG/ML  SOLN
10.0000 mL | Freq: Once | INTRAMUSCULAR | Status: AC | PRN
  Administered 2011-08-03: 10 mL

## 2011-08-03 NOTE — Telephone Encounter (Signed)
Message copied by [facility], Etta Quill on Tue Aug 03, 2011 12:57 PM ------      Message from: [place], SARAH T      Created: Tue Aug 03, 2011 11:45 AM                   ----- Message -----         From: Rad Results In Interface         Sent: 08/03/2011  11:35 AM           To: Sarah T [place], MD

## 2011-08-03 NOTE — Telephone Encounter (Signed)
Discussed normal HSG results with patient's husband.

## 2011-08-03 NOTE — Telephone Encounter (Signed)
Received call from Ronie Spies at [facility] regarding testing for Marissa Carpenter.  Pt was present for her appt for sonohysterogram, but there were concerns that an HSG would be a better option.  Pt is paying out of pocket,and apparently the cost of the HSG is much less than the sonohysterogram, which also includes transvaginal and transabdominal ultrasounds.  Order was placed in computer for HSG.  Unable to cancel the sonohysterogram -have asked support staff to see if they can cancel.

## 2011-08-03 NOTE — Addendum Note (Signed)
Addended by: Jone Baseman D on: 08/03/2011 12:38 PM   Modules accepted: Orders

## 2011-08-11 ENCOUNTER — Ambulatory Visit (INDEPENDENT_AMBULATORY_CARE_PROVIDER_SITE_OTHER): Payer: Self-pay | Admitting: Family Medicine

## 2011-08-11 ENCOUNTER — Encounter: Payer: Self-pay | Admitting: Family Medicine

## 2011-08-11 VITALS — BP 115/72 | HR 88 | Temp 98.0°F | Ht 64.0 in | Wt 182.0 lb

## 2011-08-11 DIAGNOSIS — Z319 Encounter for procreative management, unspecified: Secondary | ICD-10-CM

## 2011-08-11 DIAGNOSIS — Z23 Encounter for immunization: Secondary | ICD-10-CM

## 2011-08-11 MED ORDER — CLOMIPHENE CITRATE 50 MG PO TABS: ORAL_TABLET | ORAL | Status: DC

## 2011-08-11 NOTE — Assessment & Plan Note (Addendum)
Starting Clomid today. Start 5 days after period, then take for 5 days, then encouraged intercourse 5-10 days after Clomid course. Follow-up in 2 months. Consider increasing dose to 100 mg at that time.  Discussed side effects and red flags of Clomid. Patient and husband expressed understanding and were amenable to trying medication.   Also recommended weight loss due to elevated BMI ~30. Goal is to lose 1-2 pounds a week, 10 pounds total for now. She has already lost 3 pounds in the past month through diet and exercise.

## 2011-08-11 NOTE — Patient Instructions (Addendum)
Take medication 5 days after you start your period (so if you have a period on August 2nd, take the medication starting August 7th). Take 1 tablet a day for 5 days.   Have intercourse 5-10 days after you finish the medication.   Try to get to a healthy weight (losing 1-2 lbs a week). Good job on losing some weight already.   Follow-up in 2 months.

## 2011-08-11 NOTE — Progress Notes (Signed)
  Subjective:    Patient ID: Marissa Carpenter, female    DOB: 1985-07-28, 26 y.o.   MRN: [id number]  HPI Patient and husband interested in getting pregnant.  Discussed normal infertility work-up.  Patient's last LMP was July 2nd.   Review of Systems Denies nausea/vomiting Denies vaginal irritation  Past Medical History, Family History, Social History, Allergies, and Medications reviewed.    Objective:   Physical Exam GEN: NAD; well-appearing; overweight PSYCH: engaged, appropriate to questions CV: RRR PULM: NI WOB ABD: soft, NT    Assessment & Plan:

## 2011-08-11 NOTE — Addendum Note (Signed)
Addended by: Jone Baseman D on: 08/11/2011 02:30 PM   Modules accepted: Orders

## 2011-09-02 NOTE — Progress Notes (Signed)
Genetic Counseling  Preconception Note  Appointment Date: 05/27/2011 Referred By: Marissa Rubenstein, MD Date of Birth: [birth year] Partner: Marissa Carpenter Pregnancy History: [id number] Attending: Particia Nearing, MD  [name], [name], were seen for preconception genetic counseling given a history of two previous miscarriages. The couple declined the use of English/Lebanese medical interpreter during today's visit. [name] assisted with interpretation.   Both family histories were reviewed and found to be contributory for two first trimester miscarriages for the couple. [name] reported a sister with a history of two miscarriages determined to be due to blood clots. The specific underlying cause for her blood clots was not known at the time of today's visit. Additionally, [name] reported that her mother has a history of two spontaneous miscarriages, in addition to 6 children. An underlying cause is not known. The family history is otherwise unremarkable for birth defects, mental retardation, recurrent pregnancy loss, or known genetic conditions. Without further information regarding the provided family history, an accurate genetic risk cannot be calculated. Further genetic counseling is warranted if more information is obtained.  We discussed that at least approximately 1 in 6 confirmed pregnancies results in miscarriage. A single underlying cause is more likely to be suspected when a couple has experienced 3 or more losses. It is less likely that there will be an identifiable single underlying cause when a couple has experienced less than 3 losses. We discussed several possible causes including chromosome rearrangements, antibodies, thrombophilia, and uterine structural abnormalities.   We reviewed that [name] had a normal thrombophilia work-up (Factor V Leiden gene, prothrombin II gene, beta-2-glycoprotein, anticardiolipin, lupus anticoagulant, protein S  and protein C). We also reviewed that [name] previously had a recent normal TSH study.   We reviewed chromosomes and examples of chromosome conditions. In approximately 3-8% of couples with recurrent pregnancy loss, one partner carries a chromosome variant, such as a balanced translocation. Being a carrier of a chromosome variant can increase the risk for abnormalities in the sperm or egg cell, which can increase the risk for miscarriage or the birth of a child with birth defects and/or mental retardation. Additionally, we discussed that aneuploidy may occur sporadically in a pregnancy, which can result in miscarriage. We discussed that we would not be able to determine whether or not sporadic aneuploidy occurred in a pregnancy without testing cells directly from a pregnancy. Given the patient's age alone, a future pregnancy would be considered to be at low risk for fetal aneuploidy. We discussed that peripheral blood chromosome analysis is available to the patient and her partner to assess for an underlying chromosome rearrangement. After careful consideration, both [name] and [name] elected to proceed with peripheral blood chromosome analysis at the time of today's visit.   Additionally, we discussed that structural differences in the uterine cavity can increase the chance for miscarriage. Sonohysterography is available to assess for uterine abnormalities, if desired.   We also discussed that an underlying explanation for recurrent miscarriage is not able to be determined in approximately 50-75% of couples.  Marissa Carpenter denied exposure to environmental toxins or chemical agents. She denied the use of alcohol, tobacco or street drugs. Her medical and surgical histories were otherwise noncontributory.   We counseled this couple regarding the above risks and available options. The approximate face-to-face time with was 40 minutes.  Marissa Plowman, MS,  Certified Genetic  Counselor 05/27/2011

## 2011-09-16 ENCOUNTER — Encounter: Payer: Self-pay | Admitting: Family Medicine

## 2011-09-16 ENCOUNTER — Ambulatory Visit (INDEPENDENT_AMBULATORY_CARE_PROVIDER_SITE_OTHER): Payer: Self-pay | Admitting: Family Medicine

## 2011-09-16 VITALS — BP 123/74 | HR 103 | Temp 98.6°F | Ht 64.0 in | Wt 189.0 lb

## 2011-09-16 DIAGNOSIS — N912 Amenorrhea, unspecified: Secondary | ICD-10-CM

## 2011-09-16 DIAGNOSIS — Z331 Pregnant state, incidental: Secondary | ICD-10-CM

## 2011-09-16 DIAGNOSIS — Z3490 Encounter for supervision of normal pregnancy, unspecified, unspecified trimester: Secondary | ICD-10-CM | POA: Insufficient documentation

## 2011-09-16 NOTE — Assessment & Plan Note (Signed)
Given pregnancy verification form. Advised to go to [facility] and apply for pregnancy Medicaid. When she gets it, she was advised to make appointment for initial prenatal visit. She will also need a dating ultrasound due to uncertainty about LMP.

## 2011-09-16 NOTE — Progress Notes (Signed)
  Subjective:    Patient ID: Marissa Carpenter, female    DOB: [birth year], 26 y.o.   MRN: [id number]  HPI Positive pregnancy test at home She did not start Clomid LMP July 2nd but she had spotting in August  Review of Systems Denies nausea/vomiting Denies vaginal bleeding/irritation    Objective:   Physical Exam GEN: NAD  ABD: soft, non-tender EXT: no edema    Assessment & Plan:

## 2011-09-29 NOTE — Progress Notes (Signed)
Joint Genetic Counseling and Preconception Note  Appointment Date: 05/27/2011 Referred By: Shelly Rubenstein, MD Date of Birth: 05/13/1985 Partner: Marissa Carpenter Pregnancy History: [id number]  Attending: Particia Nearing, MD  [name] and her husband, [name], were seen for preconception genetic counseling by myself and Marissa Carpenter given a history of two previous miscarriages. The couple declined the use of English/Lebanese medical interpreter during today's visit. Mr. Fonda Kinder assisted with interpretation.   Both family histories were reviewed and found to be contributory for two first trimester miscarriages for the couple. Marissa Carpenter reported a sister with a history of two miscarriages determined to be due to blood clots. The specific underlying cause for her blood clots was not known at the time of today's visit. Additionally, Marissa Carpenter reported that her mother has a history of two spontaneous miscarriages, in addition to 6 children. An underlying cause is not known. The family history is otherwise unremarkable for birth defects, mental retardation, recurrent pregnancy loss, or known genetic conditions. Without further information regarding the provided family history, an accurate genetic risk cannot be calculated. Further genetic counseling is warranted if more information is obtained.  We discussed that at least approximately 1 in 6 confirmed pregnancies results in miscarriage. A single underlying cause is more likely to be suspected when a couple has experienced 3 or more losses. It is less likely that there will be an identifiable single underlying cause when a couple has experienced less than 3 losses. We discussed several possible causes including chromosome rearrangements, antibodies, thrombophilia, and uterine structural abnormalities.   We reviewed that [name] had a normal thrombophilia work-up (Factor V Leiden gene, prothrombin II gene, beta-2-glycoprotein,  anticardiolipin, lupus anticoagulant, protein S and protein C). We also reviewed that [name] previously had a recent normal TSH study.   We reviewed chromosomes and examples of chromosome conditions. In approximately 3-8% of couples with recurrent pregnancy loss, one partner carries a chromosome variant, such as a balanced translocation. Being a carrier of a chromosome variant can increase the risk for abnormalities in the sperm or egg cell, which can increase the risk for miscarriage or the birth of a child with birth defects and/or mental retardation. Additionally, we discussed that aneuploidy may occur sporadically in a pregnancy, which can result in miscarriage. We discussed that we would not be able to determine whether or not sporadic aneuploidy occurred in a pregnancy without testing cells directly from a pregnancy. Given the patient's age alone, a future pregnancy would be considered to be at low risk for fetal aneuploidy. We discussed that peripheral blood chromosome analysis is available to the patient and her partner to assess for an underlying chromosome rearrangement. After careful consideration, both [name] and [name] elected to proceed with peripheral blood chromosome analysis at the time of today's visit.   Additionally, we discussed that structural differences in the uterine cavity can increase the chance for miscarriage. Sonohysterography is available to assess for uterine abnormalities, if desired.   We also discussed that an underlying explanation for recurrent miscarriage is not able to be determined in approximately 50-75% of couples.  [name] denied exposure to environmental toxins or chemical agents. She denied the use of alcohol, tobacco or street drugs. Her medical and surgical histories were otherwise noncontributory.   We counseled this couple regarding the above risks and available options. The approximate face-to-face time with was 40 minutes.  Thank you  for the kind referral. Please call with  any questions or concerns.

## 2011-10-04 ENCOUNTER — Ambulatory Visit: Payer: Self-pay | Admitting: Family Medicine

## 2011-10-28 LAB — OB RESULTS CONSOLE ABO/RH

## 2011-10-28 LAB — OB RESULTS CONSOLE GC/CHLAMYDIA
Chlamydia: NEGATIVE
Gonorrhea: NEGATIVE

## 2011-10-28 LAB — OB RESULTS CONSOLE ANTIBODY SCREEN: Antibody Screen: NEGATIVE

## 2012-02-15 ENCOUNTER — Encounter: Payer: Medicaid Other | Attending: Obstetrics and Gynecology | Admitting: *Deleted

## 2012-02-15 ENCOUNTER — Encounter: Payer: Self-pay | Admitting: *Deleted

## 2012-02-15 DIAGNOSIS — Z713 Dietary counseling and surveillance: Secondary | ICD-10-CM | POA: Insufficient documentation

## 2012-02-15 DIAGNOSIS — O99810 Abnormal glucose complicating pregnancy: Secondary | ICD-10-CM | POA: Insufficient documentation

## 2012-02-15 NOTE — Progress Notes (Signed)
  Patient was seen on 02/15/12 for Gestational Diabetes self-management education at the [facility]. The following learning objectives were met by the patient during this visit:   States the definition of Gestational Diabetes  States why dietary management is important in controlling blood glucose  Describes the effects each nutrient has on blood glucose levels  Demonstrates ability to create a balanced meal plan  Demonstrates carbohydrate counting   States when to check blood glucose levels  Demonstrates proper blood glucose monitoring techniques  States the effect of stress and exercise on blood glucose levels  States the importance of limiting caffeine and abstaining from alcohol and smoking  Blood glucose monitor given: Accu Chek Nano BG Monitoring Kit Lot # [id number] Exp: 05/24/13 Blood glucose reading: 89 mg/dl  Patient instructed to monitor glucose levels: FBS: 60 - <90 1 hour: <140  *Patient received handouts:  Nutrition Diabetes and Pregnancy  Carbohydrate Counting List  Patient will be seen for follow-up as needed.

## 2012-02-15 NOTE — Patient Instructions (Signed)
Goals:  Check glucose levels per MD as instructed  Follow Gestational Diabetes Diet as instructed  Call for follow-up as needed    

## 2012-05-02 ENCOUNTER — Encounter (HOSPITAL_COMMUNITY): Payer: Self-pay | Admitting: *Deleted

## 2012-05-02 ENCOUNTER — Telehealth (HOSPITAL_COMMUNITY): Payer: Self-pay | Admitting: *Deleted

## 2012-05-02 LAB — OB RESULTS CONSOLE GBS: GBS: NEGATIVE

## 2012-05-02 NOTE — Telephone Encounter (Signed)
Preadmission screen  

## 2012-05-08 ENCOUNTER — Encounter (HOSPITAL_COMMUNITY): Payer: Self-pay | Admitting: *Deleted

## 2012-05-08 ENCOUNTER — Inpatient Hospital Stay (HOSPITAL_COMMUNITY)
Admission: AD | Admit: 2012-05-08 | Discharge: 2012-05-11 | DRG: 766 | Disposition: A | Discharge status: Home / Self Care | Payer: Medicaid Other | Source: Ambulatory Visit | Attending: Obstetrics and Gynecology | Admitting: Obstetrics and Gynecology

## 2012-05-08 DIAGNOSIS — O324XX0 Maternal care for high head at term, not applicable or unspecified: Secondary | ICD-10-CM | POA: Diagnosis present

## 2012-05-08 DIAGNOSIS — E785 Hyperlipidemia, unspecified: Secondary | ICD-10-CM

## 2012-05-08 DIAGNOSIS — O99814 Abnormal glucose complicating childbirth: Secondary | ICD-10-CM | POA: Diagnosis present

## 2012-05-08 DIAGNOSIS — Z8759 Personal history of other complications of pregnancy, childbirth and the puerperium: Secondary | ICD-10-CM

## 2012-05-08 DIAGNOSIS — Z0000 Encounter for general adult medical examination without abnormal findings: Secondary | ICD-10-CM

## 2012-05-08 DIAGNOSIS — Z794 Long term (current) use of insulin: Secondary | ICD-10-CM

## 2012-05-08 DIAGNOSIS — O24419 Gestational diabetes mellitus in pregnancy, unspecified control: Secondary | ICD-10-CM | POA: Diagnosis present

## 2012-05-08 DIAGNOSIS — K219 Gastro-esophageal reflux disease without esophagitis: Secondary | ICD-10-CM

## 2012-05-08 DIAGNOSIS — O358XX0 Maternal care for other (suspected) fetal abnormality and damage, not applicable or unspecified: Secondary | ICD-10-CM | POA: Diagnosis present

## 2012-05-08 DIAGNOSIS — Z3490 Encounter for supervision of normal pregnancy, unspecified, unspecified trimester: Secondary | ICD-10-CM

## 2012-05-08 DIAGNOSIS — O4290 Premature rupture of membranes, unspecified as to length of time between rupture and onset of labor, unspecified weeks of gestation: Secondary | ICD-10-CM | POA: Diagnosis present

## 2012-05-08 DIAGNOSIS — N96 Recurrent pregnancy loss: Secondary | ICD-10-CM

## 2012-05-08 DIAGNOSIS — Z98891 History of uterine scar from previous surgery: Secondary | ICD-10-CM

## 2012-05-08 LAB — GLUCOSE, CAPILLARY
Glucose-Capillary: 79 mg/dL (ref 70–99)
Glucose-Capillary: 92 mg/dL (ref 70–99)
Glucose-Capillary: 80 mg/dL (ref 70–99)
Glucose-Capillary: 77 mg/dL (ref 70–99)

## 2012-05-08 LAB — CBC
MCH: 27.3 pg (ref 26.0–34.0)
MCHC: 33.3 g/dL (ref 30.0–36.0)
Platelets: 270 10*3/uL (ref 150–400)
RBC: 4.66 MIL/uL (ref 3.87–5.11)

## 2012-05-08 LAB — TYPE AND SCREEN
ABO/RH(D): A POS
Antibody Screen: NEGATIVE

## 2012-05-08 LAB — RPR: RPR Ser Ql: NONREACTIVE

## 2012-05-08 MED ORDER — CITRIC ACID-SODIUM CITRATE 334-500 MG/5ML PO SOLN
30.0000 mL | ORAL | Status: DC | PRN
  Administered 2012-05-09: 30 mL via ORAL
  Filled 2012-05-08: qty 15

## 2012-05-08 MED ORDER — IBUPROFEN 600 MG PO TABS: 600.0000 mg | ORAL_TABLET | Freq: Four times a day (QID) | ORAL | Status: DC | PRN

## 2012-05-08 MED ORDER — OXYCODONE-ACETAMINOPHEN 5-325 MG PO TABS: 1.0000 | ORAL_TABLET | ORAL | Status: DC | PRN

## 2012-05-08 MED ORDER — LIDOCAINE HCL (PF) 1 % IJ SOLN
INTRAMUSCULAR | Status: DC | PRN
  Administered 2012-05-08 (×2): 4 mL

## 2012-05-08 MED ORDER — ACETAMINOPHEN 325 MG PO TABS: 650.0000 mg | ORAL_TABLET | ORAL | Status: DC | PRN

## 2012-05-08 MED ORDER — OXYTOCIN 40 UNITS IN LACTATED RINGERS INFUSION - SIMPLE MED
1.0000 m[IU]/min | INTRAVENOUS | Status: DC
  Administered 2012-05-08: 2 m[IU]/min via INTRAVENOUS
  Administered 2012-05-09: 24 m[IU]/min via INTRAVENOUS
  Filled 2012-05-08: qty 1000

## 2012-05-08 MED ORDER — EPHEDRINE 5 MG/ML INJ
10.0000 mg | INTRAVENOUS | Status: DC | PRN
  Filled 2012-05-08: qty 4

## 2012-05-08 MED ORDER — FENTANYL 2.5 MCG/ML BUPIVACAINE 1/10 % EPIDURAL INFUSION (WH - ANES)
INTRAMUSCULAR | Status: DC | PRN
  Administered 2012-05-08: 14 mL/h via EPIDURAL

## 2012-05-08 MED ORDER — PHENYLEPHRINE 40 MCG/ML (10ML) SYRINGE FOR IV PUSH (FOR BLOOD PRESSURE SUPPORT): 80.0000 ug | PREFILLED_SYRINGE | INTRAVENOUS | Status: DC | PRN

## 2012-05-08 MED ORDER — TERBUTALINE SULFATE 1 MG/ML IJ SOLN: 0.2500 mg | Freq: Once | INTRAMUSCULAR | Status: AC | PRN

## 2012-05-08 MED ORDER — LIDOCAINE HCL (PF) 1 % IJ SOLN: 30.0000 mL | INTRAMUSCULAR | Status: DC | PRN

## 2012-05-08 MED ORDER — PHENYLEPHRINE 40 MCG/ML (10ML) SYRINGE FOR IV PUSH (FOR BLOOD PRESSURE SUPPORT)
80.0000 ug | PREFILLED_SYRINGE | INTRAVENOUS | Status: DC | PRN
  Filled 2012-05-08: qty 5

## 2012-05-08 MED ORDER — EPHEDRINE 5 MG/ML INJ: 10.0000 mg | INTRAVENOUS | Status: DC | PRN

## 2012-05-08 MED ORDER — FENTANYL 2.5 MCG/ML BUPIVACAINE 1/10 % EPIDURAL INFUSION (WH - ANES)
14.0000 mL/h | INTRAMUSCULAR | Status: DC | PRN
  Administered 2012-05-08: 14 mL/h via EPIDURAL
  Filled 2012-05-08 (×2): qty 125

## 2012-05-08 MED ORDER — FENTANYL 2.5 MCG/ML BUPIVACAINE 1/10 % EPIDURAL INFUSION (WH - ANES): 14.0000 mL/h | INTRAMUSCULAR | Status: DC | PRN

## 2012-05-08 MED ORDER — DIPHENHYDRAMINE HCL 50 MG/ML IJ SOLN: 12.5000 mg | INTRAMUSCULAR | Status: DC | PRN

## 2012-05-08 MED ORDER — ONDANSETRON HCL 4 MG/2ML IJ SOLN: 4.0000 mg | Freq: Four times a day (QID) | INTRAMUSCULAR | Status: DC | PRN

## 2012-05-08 MED ORDER — LACTATED RINGERS IV SOLN: 500.0000 mL | INTRAVENOUS | Status: DC | PRN

## 2012-05-08 MED ORDER — SODIUM CHLORIDE 0.9 % IV SOLN
INTRAVENOUS | Status: DC
  Administered 2012-05-08: 0.2 [IU]/h via INTRAVENOUS
  Filled 2012-05-08: qty 1

## 2012-05-08 MED ORDER — OXYTOCIN 40 UNITS IN LACTATED RINGERS INFUSION - SIMPLE MED: 62.5000 mL/h | INTRAVENOUS | Status: DC

## 2012-05-08 MED ORDER — LACTATED RINGERS IV SOLN
500.0000 mL | Freq: Once | INTRAVENOUS | Status: AC
  Administered 2012-05-08: 500 mL via INTRAVENOUS

## 2012-05-08 MED ORDER — LACTATED RINGERS IV SOLN
INTRAVENOUS | Status: DC
  Administered 2012-05-08 – 2012-05-09 (×3): via INTRAVENOUS

## 2012-05-08 MED ORDER — OXYTOCIN BOLUS FROM INFUSION: 500.0000 mL | INTRAVENOUS | Status: DC

## 2012-05-08 NOTE — Progress Notes (Signed)
AROM forebag.

## 2012-05-08 NOTE — Anesthesia Preprocedure Evaluation (Signed)
Anesthesia Evaluation  Patient identified by MRN, date of birth, ID band Patient awake    Reviewed: Allergy & Precautions, H&P , Patient's Chart, lab work & pertinent test results  Airway       Dental   Pulmonary Current Smoker,          Cardiovascular negative cardio ROS  + dysrhythmias     Neuro/Psych negative neurological ROS  negative psych ROS   GI/Hepatic GERD-  Medicated and Controlled,  Endo/Other  diabetes, Well Controlled, Gestational, Insulin DependentMorbid obesity  Renal/GU   negative genitourinary   Musculoskeletal negative musculoskeletal ROS (+)   Abdominal   Peds  Hematology negative hematology ROS (+)   Anesthesia Other Findings   Reproductive/Obstetrics (+) Pregnancy                           Anesthesia Physical Anesthesia Plan  ASA: III  Anesthesia Plan: Epidural   Post-op Pain Management:    Induction:   Airway Management Planned: Natural Airway  Additional Equipment:   Intra-op Plan:   Post-operative Plan:   Informed Consent: I have reviewed the patients History and Physical, chart, labs and discussed the procedure including the risks, benefits and alternatives for the proposed anesthesia with the patient or authorized representative who has indicated his/her understanding and acceptance.   Dental advisory given  Plan Discussed with: Anesthesiologist  Anesthesia Plan Comments:         Anesthesia Quick Evaluation

## 2012-05-08 NOTE — Anesthesia Procedure Notes (Signed)
Epidural Patient location during procedure: OB Start time: 05/08/2012 4:03 PM  Staffing Anesthesiologist: Navil Kole A. Performed by: anesthesiologist   Preanesthetic Checklist Completed: patient identified, site marked, surgical consent, pre-op evaluation, timeout performed, IV checked, risks and benefits discussed and monitors and equipment checked  Epidural Patient position: sitting Prep: site prepped and draped and DuraPrep Patient monitoring: continuous pulse ox and blood pressure Approach: midline Injection technique: LOR air  Needle:  Needle type: Tuohy  Needle gauge: 17 G Needle length: 9 cm and 9 Needle insertion depth: 6 cm Catheter type: closed end flexible Catheter size: 19 Gauge Catheter at skin depth: 11 cm Test dose: negative and Other  Assessment Events: blood not aspirated, injection not painful, no injection resistance, negative IV test and no paresthesia  Additional Notes Patient identified. Risks and benefits discussed including failed block, incomplete  Pain control, post dural puncture headache, nerve damage, paralysis, blood pressure Changes, nausea, vomiting, reactions to medications-both toxic and allergic and post Partum back pain. All questions were answered. Patient expressed understanding and wished to proceed. Sterile technique was used throughout procedure. Epidural site was Dressed with sterile barrier dressing. No paresthesias, signs of intravascular injection Or signs of intrathecal spread were encountered.  Patient was more comfortable after the epidural was dosed. Please see RN's note for documentation of vital signs and FHR which are stable.

## 2012-05-08 NOTE — Progress Notes (Signed)
Comfortable with epidural Afeb, VSS FHT- Cat I, ctx q 3 min VE- 4-5/50/-3, vtx, SROM forebag copious clear Continue pitocin and monitor progress

## 2012-05-08 NOTE — H&P (Signed)
Marissa Carpenter is a 27 y.o. female, G3P0020, EGA 39+ weeks with EDC 4-16 presenting for PROM.  Seen in the office for leaking fluid, ROM confirmed, irreg ctx.  Prenatal care complicated by GDM controlled with insulin, fetal CP cysts on u/s.  See prenatal records for complete history.  Maternal Medical History:  Reason for admission: Rupture of membranes and contractions.   Contractions: Frequency: irregular.   Perceived severity is mild.    Fetal activity: Perceived fetal activity is normal.    Prenatal Complications - Diabetes: gestational. Diabetes is managed by insulin injections.      OB History   Grav Para Term Preterm Abortions TAB SAB Ect Mult Living   3    2  2    0    SAB x 2  Past Medical History  Diagnosis Date  . Miscarriage 08/26/2010  . Diabetes mellitus without complication   . Gestational diabetes     insulin  . Dysrhythmia     palpitations with pregnancy   History reviewed. No pertinent past surgical history. Family History: family history includes Alcohol abuse in her father; Clotting disorder in her sister; Diabetes in her father and maternal grandfather; Hypertension in her father; and Miscarriages / Stillbirths in her sister. Social History:  reports that she has been smoking.  She does not have any smokeless tobacco history on file. She reports that she does not drink alcohol or use illicit drugs.   Prenatal Transfer Tool  Maternal Diabetes: Yes:  Diabetes Type:  Insulin/Medication controlled Genetic Screening: Normal Maternal Ultrasounds/Referrals: Normal Fetal Ultrasounds or other Referrals:  Other:  Maternal Substance Abuse:  No Significant Maternal Medications:  Meds include: Other:  Significant Maternal Lab Results:  Lab values include: Group B Strep negative Other Comments:  Choroid plexus cysts, insulin controlled GDM  Review of Systems  Respiratory: Negative.   Cardiovascular: Negative.       Blood pressure 124/84, pulse 100, temperature 98.2  F (36.8 C), temperature source Oral, resp. rate 22, height 5\' 3"  (1.6 m), weight 97.977 kg (216 lb), last menstrual period 07/27/2011, unknown if currently breastfeeding. Maternal Exam:  Uterine Assessment: Contraction strength is mild.  Contraction frequency is irregular.   Abdomen: Patient reports no abdominal tenderness. Estimated fetal weight is 8 lbs.   Fetal presentation: vertex  Introitus: Normal vulva. Normal vagina.  Ferning test: positive.  Nitrazine test: positive. Amniotic fluid character: clear.  Pelvis: adequate for delivery.   Cervix: Cervix evaluated by digital exam.     Fetal Exam Fetal Monitor Review: Mode: ultrasound.   Baseline rate: 150.  Variability: moderate (6-25 bpm).   Pattern: accelerations present and no decelerations.    Fetal State Assessment: Category I - tracings are normal.     Physical Exam  Constitutional: She appears well-developed and well-nourished.  Cardiovascular: Normal rate, regular rhythm and normal heart sounds.   No murmur heard. Respiratory: Effort normal and breath sounds normal. No respiratory distress. She has no wheezes.  GI: Soft.  Gravid     Prenatal labs: ABO, Rh: A/Positive/-- (10/03 0000) Antibody: Negative (10/03 0000) Rubella: Immune (10/03 0000) RPR: Nonreactive (10/03 0000)  HBsAg: Negative (10/03 0000)  HIV: Non-reactive (10/03 0000)  GBS: Negative (04/08 0000)   Assessment/Plan: IUP at 39+ weeks with PROM, GDM controlled with insulin.  Will admit and augment ctx with pitocin, glucose stabilizer protocol.     Marissa Carpenter D 05/08/2012, 12:44 PM

## 2012-05-09 ENCOUNTER — Encounter (HOSPITAL_COMMUNITY): Payer: Self-pay | Admitting: *Deleted

## 2012-05-09 ENCOUNTER — Encounter (HOSPITAL_COMMUNITY): Payer: Self-pay | Admitting: Anesthesiology

## 2012-05-09 ENCOUNTER — Inpatient Hospital Stay (HOSPITAL_COMMUNITY): Payer: Medicaid Other | Admitting: Anesthesiology

## 2012-05-09 ENCOUNTER — Encounter (HOSPITAL_COMMUNITY)
Admission: AD | Disposition: A | Discharge status: Home / Self Care | Payer: Self-pay | Source: Ambulatory Visit | Attending: Obstetrics and Gynecology

## 2012-05-09 DIAGNOSIS — Z98891 History of uterine scar from previous surgery: Secondary | ICD-10-CM

## 2012-05-09 LAB — GLUCOSE, CAPILLARY
Glucose-Capillary: 110 mg/dL — ABNORMAL HIGH (ref 70–99)
Glucose-Capillary: 135 mg/dL — ABNORMAL HIGH (ref 70–99)
Glucose-Capillary: 129 mg/dL — ABNORMAL HIGH (ref 70–99)

## 2012-05-09 SURGERY — Surgical Case
Anesthesia: Epidural | Site: Abdomen | Wound class: Clean Contaminated

## 2012-05-09 MED ORDER — SIMETHICONE 80 MG PO CHEW
80.0000 mg | CHEWABLE_TABLET | Freq: Three times a day (TID) | ORAL | Status: DC
  Administered 2012-05-09 – 2012-05-11 (×8): 80 mg via ORAL

## 2012-05-09 MED ORDER — DIPHENHYDRAMINE HCL 50 MG/ML IJ SOLN: 25.0000 mg | INTRAMUSCULAR | Status: DC | PRN

## 2012-05-09 MED ORDER — PROMETHAZINE HCL 25 MG/ML IJ SOLN: 6.2500 mg | INTRAMUSCULAR | Status: DC | PRN

## 2012-05-09 MED ORDER — FENTANYL CITRATE 0.05 MG/ML IJ SOLN
INTRAMUSCULAR | Status: AC
  Filled 2012-05-09: qty 2

## 2012-05-09 MED ORDER — MORPHINE SULFATE 0.5 MG/ML IJ SOLN
INTRAMUSCULAR | Status: AC
  Filled 2012-05-09: qty 10

## 2012-05-09 MED ORDER — KETOROLAC TROMETHAMINE 30 MG/ML IJ SOLN: 30.0000 mg | Freq: Four times a day (QID) | INTRAMUSCULAR | Status: AC | PRN

## 2012-05-09 MED ORDER — 0.9 % SODIUM CHLORIDE (POUR BTL) OPTIME
TOPICAL | Status: DC | PRN
  Administered 2012-05-09: 300 mL
  Administered 2012-05-09: 200 mL

## 2012-05-09 MED ORDER — CEFAZOLIN SODIUM-DEXTROSE 2-3 GM-% IV SOLR
INTRAVENOUS | Status: DC | PRN
  Administered 2012-05-09: 2 g via INTRAVENOUS

## 2012-05-09 MED ORDER — NALOXONE HCL 1 MG/ML IJ SOLN
1.0000 ug/kg/h | INTRAVENOUS | Status: DC | PRN
  Filled 2012-05-09: qty 2

## 2012-05-09 MED ORDER — FENTANYL CITRATE 0.05 MG/ML IJ SOLN
25.0000 ug | INTRAMUSCULAR | Status: DC | PRN
  Administered 2012-05-09: 50 ug via INTRAVENOUS

## 2012-05-09 MED ORDER — MENTHOL 3 MG MT LOZG: 1.0000 | LOZENGE | OROMUCOSAL | Status: DC | PRN

## 2012-05-09 MED ORDER — MEASLES, MUMPS & RUBELLA VAC ~~LOC~~ INJ
0.5000 mL | INJECTION | Freq: Once | SUBCUTANEOUS | Status: DC
  Filled 2012-05-09: qty 0.5

## 2012-05-09 MED ORDER — DIPHENHYDRAMINE HCL 50 MG/ML IJ SOLN: 12.5000 mg | INTRAMUSCULAR | Status: DC | PRN

## 2012-05-09 MED ORDER — LACTATED RINGERS IV SOLN: INTRAVENOUS | Status: DC

## 2012-05-09 MED ORDER — MEPERIDINE HCL 25 MG/ML IJ SOLN: 6.2500 mg | INTRAMUSCULAR | Status: DC | PRN

## 2012-05-09 MED ORDER — OXYCODONE-ACETAMINOPHEN 5-325 MG PO TABS: 1.0000 | ORAL_TABLET | ORAL | Status: DC | PRN

## 2012-05-09 MED ORDER — KETOROLAC TROMETHAMINE 30 MG/ML IJ SOLN
INTRAMUSCULAR | Status: AC
  Filled 2012-05-09: qty 1

## 2012-05-09 MED ORDER — SCOPOLAMINE 1 MG/3DAYS TD PT72
1.0000 | MEDICATED_PATCH | Freq: Once | TRANSDERMAL | Status: DC
  Administered 2012-05-09: 1.5 mg via TRANSDERMAL

## 2012-05-09 MED ORDER — KETOROLAC TROMETHAMINE 30 MG/ML IJ SOLN
30.0000 mg | Freq: Four times a day (QID) | INTRAMUSCULAR | Status: AC | PRN
  Administered 2012-05-09 (×2): 30 mg via INTRAVENOUS
  Filled 2012-05-09: qty 1

## 2012-05-09 MED ORDER — ONDANSETRON HCL 4 MG PO TABS: 4.0000 mg | ORAL_TABLET | ORAL | Status: DC | PRN

## 2012-05-09 MED ORDER — ONDANSETRON HCL 4 MG/2ML IJ SOLN: 4.0000 mg | INTRAMUSCULAR | Status: DC | PRN

## 2012-05-09 MED ORDER — WITCH HAZEL-GLYCERIN EX PADS: 1.0000 "application " | MEDICATED_PAD | CUTANEOUS | Status: DC | PRN

## 2012-05-09 MED ORDER — MAGNESIUM HYDROXIDE 400 MG/5ML PO SUSP: 30.0000 mL | ORAL | Status: DC | PRN

## 2012-05-09 MED ORDER — ONDANSETRON HCL 4 MG/2ML IJ SOLN
INTRAMUSCULAR | Status: AC
  Filled 2012-05-09: qty 2

## 2012-05-09 MED ORDER — TETANUS-DIPHTH-ACELL PERTUSSIS 5-2.5-18.5 LF-MCG/0.5 IM SUSP
0.5000 mL | Freq: Once | INTRAMUSCULAR | Status: DC
Start: 2012-05-10 — End: 2012-05-10

## 2012-05-09 MED ORDER — DIPHENHYDRAMINE HCL 25 MG PO CAPS: 25.0000 mg | ORAL_CAPSULE | Freq: Four times a day (QID) | ORAL | Status: DC | PRN

## 2012-05-09 MED ORDER — SODIUM CHLORIDE 0.9 % IJ SOLN: 3.0000 mL | INTRAMUSCULAR | Status: DC | PRN

## 2012-05-09 MED ORDER — SCOPOLAMINE 1 MG/3DAYS TD PT72
MEDICATED_PATCH | TRANSDERMAL | Status: AC
  Filled 2012-05-09: qty 1

## 2012-05-09 MED ORDER — ACETAMINOPHEN 10 MG/ML IV SOLN
1000.0000 mg | Freq: Four times a day (QID) | INTRAVENOUS | Status: AC
  Administered 2012-05-09: 1000 mg via INTRAVENOUS
  Filled 2012-05-09 (×4): qty 100

## 2012-05-09 MED ORDER — DIPHENHYDRAMINE HCL 25 MG PO CAPS: 25.0000 mg | ORAL_CAPSULE | ORAL | Status: DC | PRN

## 2012-05-09 MED ORDER — SIMETHICONE 80 MG PO CHEW: 80.0000 mg | CHEWABLE_TABLET | ORAL | Status: DC | PRN

## 2012-05-09 MED ORDER — DIBUCAINE 1 % RE OINT: 1.0000 "application " | TOPICAL_OINTMENT | RECTAL | Status: DC | PRN

## 2012-05-09 MED ORDER — PRENATAL MULTIVITAMIN CH
1.0000 | ORAL_TABLET | Freq: Every day | ORAL | Status: DC
  Administered 2012-05-10 – 2012-05-11 (×2): 1 via ORAL
  Filled 2012-05-09 (×2): qty 1

## 2012-05-09 MED ORDER — MIDAZOLAM HCL 2 MG/2ML IJ SOLN: 0.5000 mg | Freq: Once | INTRAMUSCULAR | Status: DC | PRN

## 2012-05-09 MED ORDER — NALBUPHINE HCL 10 MG/ML IJ SOLN
5.0000 mg | INTRAMUSCULAR | Status: DC | PRN
  Filled 2012-05-09: qty 1

## 2012-05-09 MED ORDER — ONDANSETRON HCL 4 MG/2ML IJ SOLN: 4.0000 mg | Freq: Three times a day (TID) | INTRAMUSCULAR | Status: DC | PRN

## 2012-05-09 MED ORDER — SENNOSIDES-DOCUSATE SODIUM 8.6-50 MG PO TABS
2.0000 | ORAL_TABLET | Freq: Every day | ORAL | Status: DC
  Administered 2012-05-09 – 2012-05-10 (×2): 2 via ORAL

## 2012-05-09 MED ORDER — METOCLOPRAMIDE HCL 5 MG/ML IJ SOLN: 10.0000 mg | Freq: Three times a day (TID) | INTRAMUSCULAR | Status: DC | PRN

## 2012-05-09 MED ORDER — MORPHINE SULFATE (PF) 0.5 MG/ML IJ SOLN
INTRAMUSCULAR | Status: DC | PRN
  Administered 2012-05-09 (×2): .5 mg via EPIDURAL

## 2012-05-09 MED ORDER — PHENYLEPHRINE HCL 10 MG/ML IJ SOLN
INTRAMUSCULAR | Status: DC | PRN
  Administered 2012-05-09 (×2): 80 ug via INTRAVENOUS

## 2012-05-09 MED ORDER — SODIUM BICARBONATE 8.4 % IV SOLN
INTRAVENOUS | Status: DC | PRN
  Administered 2012-05-09: 5 mL via EPIDURAL

## 2012-05-09 MED ORDER — MORPHINE SULFATE (PF) 0.5 MG/ML IJ SOLN
INTRAMUSCULAR | Status: DC | PRN
  Administered 2012-05-09: 4 mg via EPIDURAL

## 2012-05-09 MED ORDER — LANOLIN HYDROUS EX OINT: 1.0000 "application " | TOPICAL_OINTMENT | CUTANEOUS | Status: DC | PRN

## 2012-05-09 MED ORDER — IBUPROFEN 600 MG PO TABS
600.0000 mg | ORAL_TABLET | Freq: Four times a day (QID) | ORAL | Status: DC
  Administered 2012-05-09 – 2012-05-11 (×8): 600 mg via ORAL
  Filled 2012-05-09 (×7): qty 1

## 2012-05-09 MED ORDER — NALOXONE HCL 0.4 MG/ML IJ SOLN: 0.4000 mg | INTRAMUSCULAR | Status: DC | PRN

## 2012-05-09 MED ORDER — ZOLPIDEM TARTRATE 5 MG PO TABS: 5.0000 mg | ORAL_TABLET | Freq: Every evening | ORAL | Status: DC | PRN

## 2012-05-09 MED ORDER — OXYTOCIN 40 UNITS IN LACTATED RINGERS INFUSION - SIMPLE MED: 62.5000 mL/h | INTRAVENOUS | Status: AC

## 2012-05-09 MED ORDER — OXYTOCIN 10 UNIT/ML IJ SOLN
INTRAMUSCULAR | Status: AC
  Filled 2012-05-09: qty 4

## 2012-05-09 MED ORDER — LACTATED RINGERS IV SOLN
INTRAVENOUS | Status: DC | PRN
  Administered 2012-05-09: 04:00:00 via INTRAVENOUS

## 2012-05-09 SURGICAL SUPPLY — 28 items
CLOTH BEACON ORANGE TIMEOUT ST (SAFETY) ×2 IMPLANT
CONTAINER PREFILL 10% NBF 15ML (MISCELLANEOUS) IMPLANT
DRAPE LG THREE QUARTER DISP (DRAPES) ×2 IMPLANT
DRSG OPSITE POSTOP 4X10 (GAUZE/BANDAGES/DRESSINGS) ×2 IMPLANT
DURAPREP 26ML APPLICATOR (WOUND CARE) ×2 IMPLANT
ELECT REM PT RETURN 9FT ADLT (ELECTROSURGICAL) ×2
ELECTRODE REM PT RTRN 9FT ADLT (ELECTROSURGICAL) ×1 IMPLANT
EXTRACTOR VACUUM M CUP 4 TUBE (SUCTIONS) ×2 IMPLANT
GLOVE BIO SURGEON STRL SZ8 (GLOVE) IMPLANT
GLOVE ORTHO TXT STRL SZ7.5 (GLOVE) ×2 IMPLANT
GOWN STRL REIN XL XLG (GOWN DISPOSABLE) ×4 IMPLANT
KIT ABG SYR 3ML LUER SLIP (SYRINGE) IMPLANT
NEEDLE HYPO 25X5/8 SAFETYGLIDE (NEEDLE) IMPLANT
NS IRRIG 1000ML POUR BTL (IV SOLUTION) ×2 IMPLANT
PACK C SECTION WH (CUSTOM PROCEDURE TRAY) ×2 IMPLANT
PAD OB MATERNITY 4.3X12.25 (PERSONAL CARE ITEMS) ×2 IMPLANT
RTRCTR C-SECT PINK 25CM LRG (MISCELLANEOUS) ×2 IMPLANT
SLEEVE SCD COMPRESS KNEE MED (MISCELLANEOUS) IMPLANT
STAPLER VISISTAT 35W (STAPLE) ×2 IMPLANT
SUT CHROMIC 1 CTX 36 (SUTURE) ×4 IMPLANT
SUT PLAIN 0 NONE (SUTURE) IMPLANT
SUT PLAIN 2 0 XLH (SUTURE) ×2 IMPLANT
SUT VIC AB 0 CT1 27 (SUTURE) ×2
SUT VIC AB 0 CT1 27XBRD ANBCTR (SUTURE) ×2 IMPLANT
SUT VIC AB 4-0 KS 27 (SUTURE) IMPLANT
TOWEL OR 17X24 6PK STRL BLUE (TOWEL DISPOSABLE) ×6 IMPLANT
TRAY FOLEY CATH 14FR (SET/KITS/TRAYS/PACK) IMPLANT
WATER STERILE IRR 1000ML POUR (IV SOLUTION) ×2 IMPLANT

## 2012-05-09 NOTE — Transfer of Care (Signed)
Immediate Anesthesia Transfer of Care Note  Patient: Marissa Carpenter  Procedure(s) Performed: Procedure(s): Primary CESAREAN SECTION of baby boy  at 62 APGAR 9/9 (N/A)  Patient Location: PACU  Anesthesia Type:Epidural  Level of Consciousness: awake, alert  and oriented  Airway & Oxygen Therapy: Patient Spontanous Breathing  Post-op Assessment: Report given to PACU RN and Post -op Vital signs reviewed and stable  Post vital signs: Reviewed and stable  Complications: No apparent anesthesia complications

## 2012-05-09 NOTE — Progress Notes (Signed)
Comfortable with epidural Afeb, VSS FHT- Cat I, ctx q 2-3 min VE- 6/50/-3, vtx, no change in at least 4 hours Discussed arrest of dilation and lack of descent, recommended c-section.  Discussed procedure and risks.  Will proceed with c-section.

## 2012-05-09 NOTE — Op Note (Signed)
Preoperative diagnosis: Intrauterine pregnancy at 39 weeks, arrest of dilation Postoperative diagnosis: Same Procedure: Primary low transverse cesarean section without extensions Surgeon: Lavina Hamman M.D. Anesthesia: Epidural  Findings: Patient had normal gravid anatomy and delivered a viable female infant with Apgars of 9 and 9 weight pending Estimated blood loss: 800 cc Specimens: Placenta sent to labor and delivery Complications: None  Procedure in detail: The patient was taken to the operating room and placed in the dorsosupine position. Her previously placed epidural was dosed appropriately.  Abdomen was then prepped and draped in the usual sterile fashion, a foley catheter had previously been inserted. The level of her anesthesia was found to be adequate. Abdomen was entered via a standard Pfannenstiel incision. Once the peritoneal cavity was entered the Alexis disposable self-retaining retractor was placed and good visualization was achieved. A 4 cm transverse incision was then made in the lower uterine segment pushing the bladder inferior. Once the uterine cavity was entered the incision was extended digitally. The fetal vertex was grasped andbrought to the incision.  A kiwi vacuum was applied for better traction and with one pull the vertex was delivered through the incision atraumatically. Mouth and nares were suctioned. The remainder of the infant then delivered atraumatically. Cord was doubly clamped and cut and the infant handed to the awaiting pediatric team. Cord blood was obtained. The placenta delivered spontaneously. Uterus was wiped dry with clean lap pad and all clots and debris were removed. Uterine incision was inspected and found to be free of extensions. Uterine incision was closed in 2 layers with running locking #1 Chromic, running locking for the first layer and an imbricating layer for the second. Tubes and ovaries were inspected and found to be normal. Uterine incision was  inspected and found to be hemostatic. Bleeding from serosal edges was controlled with electrocautery. The Alexis retractor was removed. Subfascial space was irrigated and made hemostatic with electrocautery. Fascia was closed in running fashion starting at both ends and meeting in the middle with 0 Vicryl. Subcutaneous tissue was then irrigated and made hemostatic with electrocautery, then closed with running 2-0 plain gut. Skin was closed with staples followed by a sterile dressing. Patient tolerated the procedure well and was taken to the recovery in stable condition. Counts were correct x2, she received Ancef 2 g IV at the beginning of the procedure and she had PAS hose on throughout the procedure.

## 2012-05-09 NOTE — Anesthesia Postprocedure Evaluation (Signed)
  Anesthesia Post Note  Patient: Marissa Carpenter  Procedure(s) Performed: Procedure(s) (LRB): Primary CESAREAN SECTION of baby boy  at 57 APGAR 9/9 (N/A)  Anesthesia type: Epidural  Patient location: PACU  Post pain: Pain level controlled  Post assessment: Post-op Vital signs reviewed  Last Vitals:  Filed Vitals:   05/09/12 0600  BP: 111/66  Pulse: 92  Temp:   Resp: 23    Post vital signs: Reviewed  Level of consciousness: awake  Complications: No apparent anesthesia complications

## 2012-05-09 NOTE — Anesthesia Postprocedure Evaluation (Signed)
Anesthesia Post Note  Patient: Marissa Carpenter  Procedure(s) Performed: Procedure(s) (LRB): Primary CESAREAN SECTION of baby boy  at 47 APGAR 9/9 (N/A)  Anesthesia type: Spinal  Patient location: Mother/Baby  Post pain: Pain level controlled  Post assessment: Post-op Vital signs reviewed  Last Vitals:  Filed Vitals:   05/09/12 0700  BP: 118/68  Pulse: 86  Temp: 37.4 C  Resp: 24    Post vital signs: Reviewed  Level of consciousness: awake  Complications: No apparent anesthesia complications

## 2012-05-09 NOTE — OR Nursing (Signed)
75 ml blood loss during fundal massage by DLWegner RN, cord blood x 2 to OR front desk 

## 2012-05-09 NOTE — Progress Notes (Signed)
Patient ID: Marissa Carpenter, female   DOB: 09-12-1985, 27 y.o.   MRN: [id number] DOS VS stable Pt has no complaints Output good and urine clear

## 2012-05-10 ENCOUNTER — Encounter (HOSPITAL_COMMUNITY): Payer: Self-pay | Admitting: Obstetrics and Gynecology

## 2012-05-10 LAB — CBC
MCHC: 33.5 g/dL (ref 30.0–36.0)
RDW: 17.2 % — ABNORMAL HIGH (ref 11.5–15.5)

## 2012-05-10 LAB — GLUCOSE, CAPILLARY
Glucose-Capillary: 113 mg/dL — ABNORMAL HIGH (ref 70–99)
Glucose-Capillary: 116 mg/dL — ABNORMAL HIGH (ref 70–99)

## 2012-05-10 NOTE — Progress Notes (Signed)
Patient ID: Marissa Carpenter, female   DOB: 11-Oct-1985, 27 y.o.   MRN: [id number] #1Afebrile BP normal tolerating a regular diet, passing flatus, ambulating and voiding well HGB unchanged.

## 2012-05-10 NOTE — Progress Notes (Signed)
1 hour PP breakfast CBG 160; RN notified at bedside; (note not made on glucose meter at time of CBG draw).

## 2012-05-11 MED ORDER — IBUPROFEN 600 MG PO TABS: 600.0000 mg | ORAL_TABLET | Freq: Four times a day (QID) | ORAL | Status: DC | PRN

## 2012-05-11 MED ORDER — OXYCODONE-ACETAMINOPHEN 5-325 MG PO TABS: 1.0000 | ORAL_TABLET | Freq: Four times a day (QID) | ORAL | Status: DC | PRN

## 2012-05-11 NOTE — Progress Notes (Signed)
#  2 afebrile Ready for d/c

## 2012-05-11 NOTE — Discharge Summary (Signed)
Marissa Carpenter, Marissa Carpenter                 ACCOUNT NO.:  [id number]  MEDICAL RECORD NO.:  [id number]  LOCATION:  9146                          FACILITY:  [facility]  PHYSICIAN:  Malachi Pro. Ambrose Mantle, M.D. DATE OF BIRTH:  [birth year]  DATE OF ADMISSION:  05/08/2012 DATE OF DISCHARGE:  05/11/2012                              DISCHARGE SUMMARY   HISTORY OF PRESENT ILLNESS:  This is a 27 year old female para 0-0-2-0 gravida 3 at 39+ weeks gestation presented with premature rupture of the membranes.  She was seen in the office leaking fluid, rupture of membranes was confirmed.  She was having irregular contractions. Prenatal care was complicated by gestational diabetes mellitus controlled with insulin, fetal choroid plexus cysts on ultrasound. Blood group and type A positive, antibody negative, rubella immune, RPR nonreactive, hepatitis B surface antigen negative, HIV negative, group B strep negative.  The patient had gestational diabetes mellitus controlled on insulin.  She was admitted to the hospital and placed on Pitocin.  FAMILY HISTORY:  Alcohol abuse in her father, clotting disorder in her sister, diabetes in her father and grandfather, hypertension in her father.  SOCIAL HISTORY:  The patient had been smoking.  She did not drink alcohol or use illicit drugs.  After admission to the hospital, she did receive an epidural.  By 6:38 p.m., she was comfortable with her epidural.  Fetal heart tones were normal.  Contractions every 3 minutes.  Cervix 4-5 cm, 50%.  By 3:49 a.m., the cervix was 6 cm, 50%.  The had been no change in 4 hours.  Dr. Jackelyn Knife discussed the arrest of dilatation, lack of descent and recommended a C-section.  The patient was taken to the operating room and under epidural anesthesia received a low-transverse cervical C-section without extensions, delivery of a healthy female infant with Apgars of 9 and 9 at 1 and 5 minutes.  Postpartum, the patient did quite well.  She  had normal postoperative course and was ready for discharge on the second postpartum day.  Initial hemoglobin was 12.7, hematocrit 38.1, white count 14,800, platelet count 270,000, followup hemoglobin was 12.4.  The patient did have a 1 hour postprandial blood sugar of 160 on May 10, 2012, at 9:35 a.m.  On the second postoperative day, the patient was afebrile, doing well, passing flatus, tolerating a regular diet, ambulating well and voiding well and was ready for discharge.  FINAL DIAGNOSES:  Intrauterine pregnancy at 39+ weeks, with arrest of descent.  Gestational diabetes mellitus on insulin.  OPERATION:  Low-transverse cervical C-section.  FINAL CONDITION:  Improved.  INSTRUCTIONS:  Our regular discharge instruction booklet.  The patient is advised to return to the office in 10 days for followup examination. Prescriptions include Percocet 5/325, 30 tablets, 1 every 6 hours as needed for pain and Motrin 30 tablets 600 mg 1 every 6 hours as needed for pain.  She is to continue her prenatal vitamins and return to the office in 10 days.  She was given a discharge instruction booklet as well as the after visit summary.     Malachi Pro. Ambrose Mantle, M.D.     TFH/MEDQ  D:  05/11/2012  T:  05/11/2012  Job:  [id number] 

## 2012-05-12 ENCOUNTER — Inpatient Hospital Stay (HOSPITAL_COMMUNITY): Admission: RE | Admit: 2012-05-12 | Payer: Medicaid Other | Source: Ambulatory Visit

## 2012-06-20 ENCOUNTER — Ambulatory Visit (INDEPENDENT_AMBULATORY_CARE_PROVIDER_SITE_OTHER): Payer: Medicaid Other | Admitting: Family Medicine

## 2012-06-20 ENCOUNTER — Encounter: Payer: Self-pay | Admitting: Family Medicine

## 2012-06-20 VITALS — BP 98/64 | HR 77 | Temp 98.4°F | Ht 63.0 in | Wt 185.0 lb

## 2012-06-20 DIAGNOSIS — M25531 Pain in right wrist: Secondary | ICD-10-CM | POA: Insufficient documentation

## 2012-06-20 DIAGNOSIS — M25539 Pain in unspecified wrist: Secondary | ICD-10-CM

## 2012-06-20 DIAGNOSIS — M25532 Pain in left wrist: Secondary | ICD-10-CM

## 2012-06-20 MED ORDER — MELOXICAM 15 MG PO TABS: 15.0000 mg | ORAL_TABLET | Freq: Every day | ORAL | Status: DC

## 2012-06-20 NOTE — Progress Notes (Signed)
  Subjective:    Patient ID: Marissa Carpenter, female    DOB: Aug 25, 1985, 27 y.o.   MRN: [id number]  HPI # SDA left hand swelling She feels constant pain in her left hands especially when she tries to pick anything up.  While she was in the hospital to deliver her baby last month April, they tried to put an IV into that hand and since then it has been hurting.   Progression: same   Medications tried:  -ibuprofen -Percocet Helped some but given for post delivery pain and she stopped taking both.   Review of Systems Per HPI Occasional numbness  Allergies, medication, past medical history reviewed.  Smoking status noted. Right-handed No history of trauma to that hand     Objective:   Physical Exam GEN: NAD; well-nourished, -appearing PULM: NI WOB LEFT WRIST:    Appearance: ?mildly swollen along wrists compared to right; no obvious other joint swelling, particularly in fingers    Tenderness: along radius/radial styloid   ROM: limited due to pain   Maneuvers: positive Finkelstein's, negative Tinel's/Phalen's     Assessment & Plan:

## 2012-06-20 NOTE — Assessment & Plan Note (Signed)
Likely de Quervain's. Pain for the past 4 weeks.  Discussed options. Will try conservative management at this time: anti-inflammatories, icing, wrist brace.  Follow-up in 1-2 weeks. If not improved or worse, consider glucocorticoid injection. If stable/improved, start strengthening exercises.

## 2012-06-20 NOTE — Patient Instructions (Addendum)
De Quervain's Disease Suzette Battiest disease is a condition often seen in racquet sports where there is a soreness (inflammation) in the cord like structures (tendons) which attach muscle to bone on the thumb side of the wrist. There may be a tightening of the tissuesaround the tendons. This condition is often helped by giving up or modifying the activity which caused it. When conservative treatment does not help, surgery may be required. Conservative treatment could include changes in the activity which brought about the problem or made it worse. Anti-inflammatory medications and injections may be used to help decrease the inflammation and help with pain control. Your caregiver will help you determine which is best for you. DIAGNOSIS  Often the diagnosis (learning what is wrong) can be made by examination. Sometimes x-rays are required. HOME CARE INSTRUCTIONS   Apply ice to the sore area for 15-20 minutes, 3-4 times per day while awake. Put the ice in a plastic bag and place a towel between the bag of ice and your skin. This is especially helpful if it can be done after all activities involving the sore wrist.  Temporary splinting may help.  Only take over-the-counter or prescription medicines for pain, discomfort or fever as directed by your caregiver. SEEK MEDICAL CARE IF:   Pain relief is not obtained with medications, or if you have increasing pain and seem to be getting worse rather than better. MAKE SURE YOU:   Understand these instructions.  Will watch your condition.  Will get help right away if you are not doing well or get worse. Document Released: 10/06/2000 Document Revised: 04/05/2011 Document Reviewed: 01/11/2005 [facility] Patient Information 2014 [place], [place].   1. Take anti-inflammatory medicine (meloxicam) once a day for the next 2 weeks. With food.  2. Ice your wrist 20 minutes at a time, 1-2 times a day 3. Wear a wrist brace as long as you can, but especially at  nighttime 4. Follow-up in 2 weeks.

## 2012-10-20 ENCOUNTER — Encounter: Payer: Self-pay | Admitting: Family Medicine

## 2012-10-20 ENCOUNTER — Ambulatory Visit (INDEPENDENT_AMBULATORY_CARE_PROVIDER_SITE_OTHER): Payer: Medicaid Other | Admitting: Family Medicine

## 2012-10-20 VITALS — BP 104/70 | HR 86 | Temp 98.3°F | Ht 63.0 in | Wt 196.0 lb

## 2012-10-20 DIAGNOSIS — M25539 Pain in unspecified wrist: Secondary | ICD-10-CM

## 2012-10-20 DIAGNOSIS — M549 Dorsalgia, unspecified: Secondary | ICD-10-CM | POA: Insufficient documentation

## 2012-10-20 DIAGNOSIS — M546 Pain in thoracic spine: Secondary | ICD-10-CM

## 2012-10-20 DIAGNOSIS — M5430 Sciatica, unspecified side: Secondary | ICD-10-CM

## 2012-10-20 DIAGNOSIS — M25532 Pain in left wrist: Secondary | ICD-10-CM

## 2012-10-20 DIAGNOSIS — M5442 Lumbago with sciatica, left side: Secondary | ICD-10-CM | POA: Insufficient documentation

## 2012-10-20 DIAGNOSIS — Z3490 Encounter for supervision of normal pregnancy, unspecified, unspecified trimester: Secondary | ICD-10-CM | POA: Insufficient documentation

## 2012-10-20 NOTE — Patient Instructions (Addendum)
It was good to see you today.  For your low back pain, I think this is sciatica.  - Use heating pad (not too hot) for 20 minutes at a time, do stretches, and try tylenol. Do not use mobic, advil, ibuprofen, naproxen. - Come back in 1 month for re-evaluation or sooner if you have bowel or bladder changes, weakness, or increased pain/tingling.  For your upper back pain, this is likely musculoskeletal strain. - Come back as soon as possible if you start having chest pain with shortness of breath or fevers.  For your left wrist pain, this looks like a ganglion cyst.  - I am referring you to Sports Medicine who can try to do an ultrasound-guided aspiration of any fluid inside and injection of steroids. - Come back if the joint gets red or swollen or if you have fever.   Sciatica with Rehab The sciatic nerve runs from the back down the leg and is responsible for sensation and control of the muscles in the back (posterior) side of the thigh, lower leg, and foot. Sciatica is a condition that is characterized by inflammation of this nerve.  SYMPTOMS   Signs of nerve damage, including numbness and/or weakness along the posterior side of the lower extremity.  Pain in the back of the thigh that may also travel down the leg.  Pain that worsens when sitting for long periods of time.  Occasionally, pain in the back or buttock. CAUSES  Inflammation of the sciatic nerve is the cause of sciatica. The inflammation is due to something irritating the nerve. Common sources of irritation include:  Sitting for long periods of time.  Direct trauma to the nerve.  Arthritis of the spine.  Herniated or ruptured disk.  Slipping of the vertebrae (spondylolithesis)  Pressure from soft tissues, such as muscles or ligament-like tissue (fascia). RISK INCREASES WITH:  Sports that place pressure or stress on the spine (football or weightlifting).  Poor strength and flexibility.  Failure to warm-up  properly before activity.  Family history of low back pain or disk disorders.  Previous back injury or surgery.  Poor body mechanics, especially when lifting, or poor posture. PREVENTION   Warm up and stretch properly before activity.  Maintain physical fitness:  Strength, flexibility, and endurance.  Cardiovascular fitness.  Learn and use proper technique, especially with posture and lifting. When possible, have coach correct improper technique.  Avoid activities that place stress on the spine. PROGNOSIS If treated properly, then sciatica usually resolves within 6 weeks. However, occasionally surgery is necessary.  RELATED COMPLICATIONS   Permanent nerve damage, including pain, numbness, tingle, or weakness.  Chronic back pain.  Risks of surgery: infection, bleeding, nerve damage, or damage to surrounding tissues. TREATMENT Treatment initially involves resting from any activities that aggravate your symptoms. The use of ice and medication may help reduce pain and inflammation. The use of strengthening and stretching exercises may help reduce pain with activity. These exercises may be performed at home or with referral to a therapist. A therapist may recommend further treatments, such as transcutaneous electronic nerve stimulation (TENS) or ultrasound. Your caregiver may recommend corticosteroid injections to help reduce inflammation of the sciatic nerve. If symptoms persist despite non-surgical (conservative) treatment, then surgery may be recommended. MEDICATION  If pain medication is necessary, since you are pregnant, try acetaminophen if needed.  Do not take pain medication for 7 days before surgery.  Prescription pain relievers may be given if deemed necessary by your caregiver. Use only  as directed and only as much as you need.  Ointments applied to the skin may be helpful.  Corticosteroid injections may be given by your caregiver. These injections should be reserved  for the most serious cases, because they may only be given a certain number of times. HEAT AND COLD  Cold treatment (icing) relieves pain and reduces inflammation. Cold treatment should be applied for 10 to 15 minutes every 2 to 3 hours for inflammation and pain and immediately after any activity that aggravates your symptoms. Use ice packs or massage the area with a piece of ice (ice massage).  Heat treatment may be used prior to performing the stretching and strengthening activities prescribed by your caregiver, physical therapist, or athletic trainer. Use a heat pack or soak the injury in warm water. SEEK MEDICAL CARE IF:  Treatment seems to offer no benefit, or the condition worsens.  Any medications produce adverse side effects. EXERCISES  RANGE OF MOTION (ROM) AND STRETCHING EXERCISES - Sciatica Most people with sciatic will find that their symptoms worsen with either excessive bending forward (flexion) or arching at the low back (extension). The exercises which will help resolve your symptoms will focus on the opposite motion. Your physician, physical therapist or athletic trainer will help you determine which exercises will be most helpful to resolve your low back pain. Do not complete any exercises without first consulting with your clinician. Discontinue any exercises which worsen your symptoms until you speak to your clinician. If you have pain, numbness or tingling which travels down into your buttocks, leg or foot, the goal of the therapy is for these symptoms to move closer to your back and eventually resolve. Occasionally, these leg symptoms will get better, but your low back pain may worsen; this is typically an indication of progress in your rehabilitation. Be certain to be very alert to any changes in your symptoms and the activities in which you participated in the 24 hours prior to the change. Sharing this information with your clinician will allow him/her to most efficiently treat  your condition. These exercises may help you when beginning to rehabilitate your injury. Your symptoms may resolve with or without further involvement from your physician, physical therapist or athletic trainer. While completing these exercises, remember:   Restoring tissue flexibility helps normal motion to return to the joints. This allows healthier, less painful movement and activity.  An effective stretch should be held for at least 30 seconds.  A stretch should never be painful. You should only feel a gentle lengthening or release in the stretched tissue. FLEXION RANGE OF MOTION AND STRETCHING EXERCISES: STRETCH  Flexion, Single Knee to Chest   Lie on a firm bed or floor with both legs extended in front of you.  Keeping one leg in contact with the floor, bring your opposite knee to your chest. Hold your leg in place by either grabbing behind your thigh or at your knee.  Pull until you feel a gentle stretch in your low back. Hold __________ seconds.  Slowly release your grasp and repeat the exercise with the opposite side. Repeat __________ times. Complete this exercise __________ times per day.  STRETCH  Flexion, Double Knee to Chest  Lie on a firm bed or floor with both legs extended in front of you.  Keeping one leg in contact with the floor, bring your opposite knee to your chest.  Tense your stomach muscles to support your back and then lift your other knee to your chest. Hold  your legs in place by either grabbing behind your thighs or at your knees.  Pull both knees toward your chest until you feel a gentle stretch in your low back. Hold __________ seconds.  Tense your stomach muscles and slowly return one leg at a time to the floor. Repeat __________ times. Complete this exercise __________ times per day.  STRETCH  Low Trunk Rotation   Lie on a firm bed or floor. Keeping your legs in front of you, bend your knees so they are both pointed toward the ceiling and your feet  are flat on the floor.  Extend your arms out to the side. This will stabilize your upper body by keeping your shoulders in contact with the floor.  Gently and slowly drop both knees together to one side until you feel a gentle stretch in your low back. Hold for __________ seconds.  Tense your stomach muscles to support your low back as you bring your knees back to the starting position. Repeat the exercise to the other side. Repeat __________ times. Complete this exercise __________ times per day  EXTENSION RANGE OF MOTION AND FLEXIBILITY EXERCISES: STRETCH  Extension, Prone on Elbows  Lie on your stomach on the floor, a bed will be too soft. Place your palms about shoulder width apart and at the height of your head.  Place your elbows under your shoulders. If this is too painful, stack pillows under your chest.  Allow your body to relax so that your hips drop lower and make contact more completely with the floor.  Hold this position for __________ seconds.  Slowly return to lying flat on the floor. Repeat __________ times. Complete this exercise __________ times per day.  RANGE OF MOTION  Extension, Prone Press Ups  Lie on your stomach on the floor, a bed will be too soft. Place your palms about shoulder width apart and at the height of your head.  Keeping your back as relaxed as possible, slowly straighten your elbows while keeping your hips on the floor. You may adjust the placement of your hands to maximize your comfort. As you gain motion, your hands will come more underneath your shoulders.  Hold this position __________ seconds.  Slowly return to lying flat on the floor. Repeat __________ times. Complete this exercise __________ times per day.  STRENGTHENING EXERCISES - Sciatica  These exercises may help you when beginning to rehabilitate your injury. These exercises should be done near your "sweet spot." This is the neutral, low-back arch, somewhere between fully rounded and  fully arched, that is your least painful position. When performed in this safe range of motion, these exercises can be used for people who have either a flexion or extension based injury. These exercises may resolve your symptoms with or without further involvement from your physician, physical therapist or athletic trainer. While completing these exercises, remember:   Muscles can gain both the endurance and the strength needed for everyday activities through controlled exercises.  Complete these exercises as instructed by your physician, physical therapist or athletic trainer. Progress with the resistance and repetition exercises only as your caregiver advises.  You may experience muscle soreness or fatigue, but the pain or discomfort you are trying to eliminate should never worsen during these exercises. If this pain does worsen, stop and make certain you are following the directions exactly. If the pain is still present after adjustments, discontinue the exercise until you can discuss the trouble with your clinician. STRENGTHENING Deep Abdominals, Pelvic Tilt  Lie on a firm bed or floor. Keeping your legs in front of you, bend your knees so they are both pointed toward the ceiling and your feet are flat on the floor.  Tense your lower abdominal muscles to press your low back into the floor. This motion will rotate your pelvis so that your tail bone is scooping upwards rather than pointing at your feet or into the floor.  With a gentle tension and even breathing, hold this position for __________ seconds. Repeat __________ times. Complete this exercise __________ times per day.  STRENGTHENING  Abdominals, Crunches   Lie on a firm bed or floor. Keeping your legs in front of you, bend your knees so they are both pointed toward the ceiling and your feet are flat on the floor. Cross your arms over your chest.  Slightly tip your chin down without bending your neck.  Tense your abdominals and  slowly lift your trunk high enough to just clear your shoulder blades. Lifting higher can put excessive stress on the low back and does not further strengthen your abdominal muscles.  Control your return to the starting position. Repeat __________ times. Complete this exercise __________ times per day.  STRENGTHENING  Quadruped, Opposite UE/LE Lift  Assume a hands and knees position on a firm surface. Keep your hands under your shoulders and your knees under your hips. You may place padding under your knees for comfort.  Find your neutral spine and gently tense your abdominal muscles so that you can maintain this position. Your shoulders and hips should form a rectangle that is parallel with the floor and is not twisted.  Keeping your trunk steady, lift your right hand no higher than your shoulder and then your left leg no higher than your hip. Make sure you are not holding your breath. Hold this position __________ seconds.  Continuing to keep your abdominal muscles tense and your back steady, slowly return to your starting position. Repeat with the opposite arm and leg. Repeat __________ times. Complete this exercise __________ times per day.  STRENGTHENING  Abdominals and Quadriceps, Straight Leg Raise   Lie on a firm bed or floor with both legs extended in front of you.  Keeping one leg in contact with the floor, bend the other knee so that your foot can rest flat on the floor.  Find your neutral spine, and tense your abdominal muscles to maintain your spinal position throughout the exercise.  Slowly lift your straight leg off the floor about 6 inches for a count of 15, making sure to not hold your breath.  Still keeping your neutral spine, slowly lower your leg all the way to the floor. Repeat this exercise with each leg __________ times. Complete this exercise __________ times per day. POSTURE AND BODY MECHANICS CONSIDERATIONS - Sciatica Keeping correct posture when sitting, standing  or completing your activities will reduce the stress put on different body tissues, allowing injured tissues a chance to heal and limiting painful experiences. The following are general guidelines for improved posture. Your physician or physical therapist will provide you with any instructions specific to your needs. While reading these guidelines, remember:  The exercises prescribed by your provider will help you have the flexibility and strength to maintain correct postures.  The correct posture provides the optimal environment for your joints to work. All of your joints have less wear and tear when properly supported by a spine with good posture. This means you will experience a healthier, less painful body.  Correct posture must be practiced with all of your activities, especially prolonged sitting and standing. Correct posture is as important when doing repetitive low-stress activities (typing) as it is when doing a single heavy-load activity (lifting). RESTING POSITIONS Consider which positions are most painful for you when choosing a resting position. If you have pain with flexion-based activities (sitting, bending, stooping, squatting), choose a position that allows you to rest in a less flexed posture. You would want to avoid curling into a fetal position on your side. If your pain worsens with extension-based activities (prolonged standing, working overhead), avoid resting in an extended position such as sleeping on your stomach. Most people will find more comfort when they rest with their spine in a more neutral position, neither too rounded nor too arched. Lying on a non-sagging bed on your side with a pillow between your knees, or on your back with a pillow under your knees will often provide some relief. Keep in mind, being in any one position for a prolonged period of time, no matter how correct your posture, can still lead to stiffness. PROPER SITTING POSTURE In order to minimize stress and  discomfort on your spine, you must sit with correct posture Sitting with good posture should be effortless for a healthy body. Returning to good posture is a gradual process. Many people can work toward this most comfortably by using various supports until they have the flexibility and strength to maintain this posture on their own. When sitting with proper posture, your ears will fall over your shoulders and your shoulders will fall over your hips. You should use the back of the chair to support your upper back. Your low back will be in a neutral position, just slightly arched. You may place a small pillow or folded towel at the base of your low back for support.  When working at a desk, create an environment that supports good, upright posture. Without extra support, muscles fatigue and lead to excessive strain on joints and other tissues. Keep these recommendations in mind: CHAIR:   A chair should be able to slide under your desk when your back makes contact with the back of the chair. This allows you to work closely.  The chair's height should allow your eyes to be level with the upper part of your monitor and your hands to be slightly lower than your elbows. BODY POSITION  Your feet should make contact with the floor. If this is not possible, use a foot rest.  Keep your ears over your shoulders. This will reduce stress on your neck and low back. INCORRECT SITTING POSTURES   If you are feeling tired and unable to assume a healthy sitting posture, do not slouch or slump. This puts excessive strain on your back tissues, causing more damage and pain. Healthier options include:  Using more support, like a lumbar pillow.  Switching tasks to something that requires you to be upright or walking.  Talking a brief walk.  Lying down to rest in a neutral-spine position. PROLONGED STANDING WHILE SLIGHTLY LEANING FORWARD  When completing a task that requires you to lean forward while standing in one  place for a long time, place either foot up on a stationary 2-4 inch high object to help maintain the best posture. When both feet are on the ground, the low back tends to lose its slight inward curve. If this curve flattens (or becomes too large), then the back and your other joints will experience too much stress,  fatigue more quickly and can cause pain.  CORRECT STANDING POSTURES Proper standing posture should be assumed with all daily activities, even if they only take a few moments, like when brushing your teeth. As in sitting, your ears should fall over your shoulders and your shoulders should fall over your hips. You should keep a slight tension in your abdominal muscles to brace your spine. Your tailbone should point down to the ground, not behind your body, resulting in an over-extended swayback posture.  INCORRECT STANDING POSTURES  Common incorrect standing postures include a forward head, locked knees and/or an excessive swayback. WALKING Walk with an upright posture. Your ears, shoulders and hips should all line-up. PROLONGED ACTIVITY IN A FLEXED POSITION When completing a task that requires you to bend forward at your waist or lean over a low surface, try to find a way to stabilize 3 of 4 of your limbs. You can place a hand or elbow on your thigh or rest a knee on the surface you are reaching across. This will provide you more stability so that your muscles do not fatigue as quickly. By keeping your knees relaxed, or slightly bent, you will also reduce stress across your low back. CORRECT LIFTING TECHNIQUES DO :   Assume a wide stance. This will provide you more stability and the opportunity to get as close as possible to the object which you are lifting.  Tense your abdominals to brace your spine; then bend at the knees and hips. Keeping your back locked in a neutral-spine position, lift using your leg muscles. Lift with your legs, keeping your back straight.  Test the weight of  unknown objects before attempting to lift them.  Try to keep your elbows locked down at your sides in order get the best strength from your shoulders when carrying an object.  Always ask for help when lifting heavy or awkward objects. INCORRECT LIFTING TECHNIQUES DO NOT:   Lock your knees when lifting, even if it is a small object.  Bend and twist. Pivot at your feet or move your feet when needing to change directions.  Assume that you cannot safely pick up a paperclip without proper posture. Document Released: 01/11/2005 Document Revised: 04/05/2011 Document Reviewed: 04/25/2008 [facility] Patient Information 2014 [place], [place].

## 2012-10-20 NOTE — Assessment & Plan Note (Signed)
Likely painful ganglion cyst, no signs of septic joint, erythema, effusiono, or fever. - Stopped mobic previously due to pregnant - Referring to sports medicine for ultrasound-guided aspiration and injection of steroid - Return precautions reviewed - F/u 1 month

## 2012-10-20 NOTE — Progress Notes (Signed)
Patient ID: Marissa Carpenter, female   DOB: 06-01-1985, 27 y.o.   MRN: [id number] Subjective:   CC: Same day for left sided back pain into foot, left wrist pain, and upper back pain  HPI:   1. Low back pain going down left leg - Reports 2 weeks of back pain extending down left leg into foot. Denies bowel/bladder symptoms. Has occasional left foot numbness/tingling. Denies redness, swelling, fever, weakness, trauma, heavy lifting, or other inciting event.  2. Left wrist pain - Present since birth of son 5 months ago, thought due to IV placement in hospital, still swollen at cyst-like area and painful when touches it, and some numbess and pain along first and second tendons of the hand, no weakness, fevers, chills, or worsening over 5 months. Had been prescribed mobic which helped when taking it, but stopped when found out pregnant a few weeks ago.  3. Upper back pain - left-sided, "sharp," tender when push on it, recent. Denies shortness of breath. Some chest pain on left side that is reproducible when pushed on. Denies dizziness, fainting, or movement of left back pain. Denies weakness, numbness/tingling in arm.  Review of Systems - Per HPI.   PMH:   - Took mobic off med list because not taking during pregnancy  Objective:  Physical Exam BP 104/70  Pulse 86  Temp(Src) 98.3 F (36.8 C) (Oral)  Ht 5\' 3"  (1.6 m)  Wt 196 lb (88.905 kg)  BMI 34.73 kg/m2 GEN: NAD HEENT: Atraumatic, normocephalic, neck supple, EOMI, sclera clear  CV: RRR, no murmurs, rubs, or gallops; left-chest mild tenderness to palpation PULM: CTAB, normal effort ABD: Soft, nontender, gravid SKIN: No rash or cyanosis; warm and well-perfused EXTR: No lower extremity edema or calf tenderness;  - left positive straight leg raise to 30 degrees, right negative, - back tenderness throughout lumbar region, no obvious deformity, pain on bending forward to 90 degrees. - Left fingers normal strength, increased pain on extension and  tenderness over first two fingers joints and extensor tendons, no obvious deformity other than over radial-side carpal bone 1/2 cm firm nodule/ganglion cyst, tender; no erythema - 2+ patellar reflex bilaterally, 5/5 hip extensor and flexor and quad strength bilaterally PSYCH: Mood and affect euthymic, normal rate and volume of speech NEURO: Awake, alert, no focal deficits grossly, normal speech; normal gait     Assessment:     Marissa Carpenter is a 27 y.o. female here for low back pain, left wrist pain, and upper back pain    Plan:     # See problem list for problem-specific plans.

## 2012-10-20 NOTE — Assessment & Plan Note (Signed)
No obvious inciting cause or trauma, but likely MSK given tenderness on exam, no involvement of arms or neck pain to make me think cervical disk disease, no shortness of breath or fever, no weakness. - Tylenol prn - Return precautions reviewed - F/u 1 month

## 2012-10-20 NOTE — Assessment & Plan Note (Signed)
Likely sciatica caused by pregnancy. No obvious inciting event. No red flag symptoms. - Heating pad, stretches, tylenol prn - No NSAIDs during pregnancy - F/u 1 month; red flag symptoms/return precautions reviewed

## 2012-11-01 ENCOUNTER — Ambulatory Visit: Payer: Medicaid Other | Admitting: Sports Medicine

## 2012-11-02 LAB — OB RESULTS CONSOLE ABO/RH: RH TYPE: POSITIVE

## 2012-11-02 LAB — OB RESULTS CONSOLE HIV ANTIBODY (ROUTINE TESTING): HIV: NONREACTIVE

## 2012-11-02 LAB — OB RESULTS CONSOLE RPR: RPR: NONREACTIVE

## 2012-11-02 LAB — OB RESULTS CONSOLE RUBELLA ANTIBODY, IGM: Rubella: IMMUNE

## 2012-11-02 LAB — OB RESULTS CONSOLE HEPATITIS B SURFACE ANTIGEN: HEP B S AG: NEGATIVE

## 2012-11-02 LAB — OB RESULTS CONSOLE ANTIBODY SCREEN: Antibody Screen: NEGATIVE

## 2012-11-13 ENCOUNTER — Ambulatory Visit: Payer: Medicaid Other | Admitting: Family Medicine

## 2012-11-15 ENCOUNTER — Inpatient Hospital Stay (HOSPITAL_COMMUNITY)
Admission: AD | Admit: 2012-11-15 | Discharge: 2012-11-15 | Disposition: A | Discharge status: Home / Self Care | Payer: Medicaid Other | Source: Ambulatory Visit | Attending: Obstetrics and Gynecology | Admitting: Obstetrics and Gynecology

## 2012-11-15 ENCOUNTER — Encounter (HOSPITAL_COMMUNITY): Payer: Self-pay | Admitting: *Deleted

## 2012-11-15 DIAGNOSIS — J029 Acute pharyngitis, unspecified: Secondary | ICD-10-CM | POA: Insufficient documentation

## 2012-11-15 DIAGNOSIS — O99891 Other specified diseases and conditions complicating pregnancy: Secondary | ICD-10-CM | POA: Insufficient documentation

## 2012-11-15 DIAGNOSIS — J069 Acute upper respiratory infection, unspecified: Secondary | ICD-10-CM | POA: Insufficient documentation

## 2012-11-15 LAB — RAPID STREP SCREEN (MED CTR MEBANE ONLY): Streptococcus, Group A Screen (Direct): NEGATIVE

## 2012-11-15 NOTE — MAU Provider Note (Signed)
History     CSN: [id number]  Arrival date and time: 11/15/12 3086   First Provider Initiated Contact with Patient 11/15/12 1953      Chief Complaint  Patient presents with  . Fever  . Sore Throat  . Nasal Congestion   HPI Marissa Carpenter is 27 y.o. [id number] [redacted]w[redacted]d weeks presenting with sore throat, left earache and nasal congestion X 2 days.  States she had fever--101 today.  She has taken anything for the fever but has used saline nose gtts.   She is a patient of Dr. Berenda Morale.  Seen 2 weeks ago in the office. Has appt on 10/17.     Past Medical History  Diagnosis Date  . Miscarriage 08/26/2010  . Diabetes mellitus without complication   . Gestational diabetes     insulin  . Dysrhythmia     palpitations with pregnancy  . Gestational diabetes mellitus, class A2 05/08/2012    Past Surgical History  Procedure Laterality Date  . Cesarean section N/A 05/09/2012    Procedure: Primary CESAREAN SECTION of baby boy  at 0435 APGAR 9/9;  Surgeon: Lavina Hamman, MD;  Location: [facility];  Service: Obstetrics;  Laterality: N/A;    Family History  Problem Relation Age of Onset  . Miscarriages / Stillbirths Sister   . Clotting disorder Sister   . Alcohol abuse Father   . Diabetes Father   . Hypertension Father   . Diabetes Maternal Grandfather     History  Substance Use Topics  . Smoking status: Former Games developer  . Smokeless tobacco: Not on file     Comment: Hookah 2-3 times per week  . Alcohol Use: No    Allergies: No Known Allergies  Prescriptions prior to admission  Medication Sig Dispense Refill  . famotidine (PEPCID) 20 MG tablet Take 20 mg by mouth 2 (two) times daily as needed for heartburn.      . Prenatal Vit-Fe Fumarate-FA (PRENATAL MULTIVITAMIN) TABS Take 1 tablet by mouth daily.        Review of Systems  Constitutional: Positive for fever. Negative for chills.  HENT: Positive for congestion, ear pain (left ear) and sore throat.   Respiratory: Negative for cough,  hemoptysis, sputum production, shortness of breath and wheezing.   Genitourinary: Negative for dysuria, urgency and frequency.       Neg for vaginal bleeding  Neurological: Negative for headaches.   Physical Exam   Blood pressure 116/60, pulse 87, temperature 98.9 F (37.2 C), temperature source Oral, resp. rate 16, height 5\' 4"  (1.626 m), weight 202 lb 9.6 oz (91.899 kg), last menstrual period 07/27/2011, not currently breastfeeding.  Physical Exam  Nursing note and vitals reviewed. Constitutional: She is oriented to person, place, and time. She appears well-developed and well-nourished.  HENT:  Head: Normocephalic.  Right Ear: Tympanic membrane, external ear and ear canal normal.  Left Ear: External ear and ear canal normal. No drainage. Tympanic membrane is injected. Tympanic membrane is not retracted and not bulging. No hemotympanum.  Nose: Rhinorrhea present. Right sinus exhibits no maxillary sinus tenderness and no frontal sinus tenderness. Left sinus exhibits no maxillary sinus tenderness and no frontal sinus tenderness.  Mouth/Throat: Mucous membranes are not pale. No oropharyngeal exudate or tonsillar abscesses.  Cardiovascular: Normal rate.   Respiratory: Effort normal. No respiratory distress.  Lymphadenopathy:    She has no cervical adenopathy.  Neurological: She is alert and oriented to person, place, and time.  Skin: Skin is warm and dry.  Psychiatric: She  has a normal mood and affect. Her behavior is normal.   FETAL HEART RATE BY DOPPLER 166  MAU Course  Procedures  Rapid throat culture to lab  MDM 20:05 MSE reported to Dr. Jackelyn Knife.  Order for rapid throat culture. Treat symptomatically 20:50  Care turned over to V. Katrinka Blazing, CNM  Assessment and Plan  A:  URI--sore throat, nasal congestion and left ear pain  KEY,EVE M 11/15/2012, 8:01 PM   Care of pt assumed by [place], CNM at 2045. Rapid GAS pending.  Results for orders placed during the hospital  encounter of 11/15/12 (from the past 24 hour(s))  RAPID STREP SCREEN     Status: None   Collection Time    11/15/12  8:13 PM      Result Value Range   Streptococcus, Group A Screen (Direct) NEGATIVE  NEGATIVE   ASSESSMENT: 1. Viral URI    PLAN: D/C home in stable condition. Comfort measures, throat lozenges PRN.  Discussed ABX non-use for what is most likely a viral infection. F/U w/ Ob or PCP if no improvement in 1 week.  Follow-up Information   Follow up with MEISINGER,TODD D, MD. (as scheduled or as needed if symptoms worsen)    Specialty:  Obstetrics and Gynecology   Contact information:   [address] [place] [address] [phone]       Follow up with [facility] [place] [facility]. (As needed in emergencies)    Contact information:   [address] [id number] [place] [address] [phone]       Medication List         prenatal multivitamin Tabs tablet  Take 1 tablet by mouth daily.     sodium chloride 0.65 % nasal spray  Commonly known as:  OCEAN  Place 1 spray into the nose as needed for congestion.       [place], CNM 11/16/2012 12:07 AM

## 2012-11-15 NOTE — MAU Note (Signed)
Release of interpretation form signed for husband to interpret.

## 2012-11-15 NOTE — MAU Note (Signed)
Pt with fever of 101 at home since Monday, sore throat and nasal congestion.  G4 P1 at 12.5wks.

## 2012-11-18 LAB — CULTURE, GROUP A STREP

## 2013-01-26 ENCOUNTER — Inpatient Hospital Stay (HOSPITAL_COMMUNITY)
Admission: AD | Admit: 2013-01-26 | Discharge: 2013-01-26 | Disposition: A | Discharge status: Home / Self Care | Payer: Medicaid Other | Source: Ambulatory Visit | Attending: Obstetrics and Gynecology | Admitting: Obstetrics and Gynecology

## 2013-01-26 ENCOUNTER — Encounter (HOSPITAL_COMMUNITY): Payer: Self-pay | Admitting: *Deleted

## 2013-01-26 DIAGNOSIS — R0602 Shortness of breath: Secondary | ICD-10-CM | POA: Insufficient documentation

## 2013-01-26 DIAGNOSIS — Z87891 Personal history of nicotine dependence: Secondary | ICD-10-CM | POA: Insufficient documentation

## 2013-01-26 DIAGNOSIS — R079 Chest pain, unspecified: Secondary | ICD-10-CM | POA: Insufficient documentation

## 2013-01-26 DIAGNOSIS — R42 Dizziness and giddiness: Secondary | ICD-10-CM

## 2013-01-26 DIAGNOSIS — J3489 Other specified disorders of nose and nasal sinuses: Secondary | ICD-10-CM | POA: Insufficient documentation

## 2013-01-26 LAB — GLUCOSE, CAPILLARY: Glucose-Capillary: 73 mg/dL (ref 70–99)

## 2013-01-26 LAB — URINALYSIS, ROUTINE W REFLEX MICROSCOPIC
Bilirubin Urine: NEGATIVE
Glucose, UA: NEGATIVE mg/dL
Hgb urine dipstick: NEGATIVE
Ketones, ur: NEGATIVE mg/dL
NITRITE: NEGATIVE
PROTEIN: NEGATIVE mg/dL
Specific Gravity, Urine: <1.005 — ABNORMAL LOW (ref 1.005–1.030)
UROBILINOGEN UA: 0.2 mg/dL (ref 0.0–1.0)
pH: 6.5 (ref 5.0–8.0)

## 2013-01-26 LAB — URINE MICROSCOPIC-ADD ON

## 2013-01-26 MED ORDER — ACETAMINOPHEN-CODEINE #3 300-30 MG PO TABS
1.0000 | ORAL_TABLET | Freq: Once | ORAL | Status: AC
  Administered 2013-01-26: 1 via ORAL
  Filled 2013-01-26: qty 1

## 2013-01-26 MED ORDER — ACETAMINOPHEN-CODEINE #3 300-30 MG PO TABS: 1.0000 | ORAL_TABLET | Freq: Four times a day (QID) | ORAL | Status: DC | PRN

## 2013-01-26 MED ORDER — HYDROXYZINE HCL 25 MG PO TABS
25.0000 mg | ORAL_TABLET | Freq: Once | ORAL | Status: AC
  Administered 2013-01-26: 25 mg via ORAL
  Filled 2013-01-26: qty 1

## 2013-01-26 MED ORDER — HYDROXYZINE HCL 25 MG PO TABS: 25.0000 mg | ORAL_TABLET | Freq: Four times a day (QID) | ORAL | Status: DC

## 2013-01-26 NOTE — MAU Provider Note (Signed)
History     CSN: [id number]  Arrival date and time: 01/26/13 1429   First Provider Initiated Contact with Patient 01/26/13 1533      Chief Complaint  Patient presents with  . Dizziness  . Nasal Congestion  . Tachycardia    reported at home  . Shortness of Breath    reported at home   HPI Comments: Marissa Carpenter 28 y.o. [id number] presents to MAU with dizziness, nasal congestion, chest pain and SOB. She denies anxiety. She has diabetes and feels this is related to her diabetes. Her sugars today are 103, 159 and 73. She took tylenol this morning. She describes her nasal congestion as bloody in morning. Denies any fever. Has headaches behind her eyes  Dizziness Associated symptoms include headaches. Pertinent negatives include no fever.  Shortness of Breath Associated symptoms include headaches. Pertinent negatives include no fever.      Past Medical History  Diagnosis Date  . Miscarriage 08/26/2010  . Diabetes mellitus without complication   . Gestational diabetes     insulin  . Dysrhythmia     palpitations with pregnancy  . Gestational diabetes mellitus, class A2 05/08/2012    Past Surgical History  Procedure Laterality Date  . Cesarean section N/A 05/09/2012    Procedure: Primary CESAREAN SECTION of baby boy  at 0435 APGAR 9/9;  Surgeon: Lavina Hamman, MD;  Location: [facility];  Service: Obstetrics;  Laterality: N/A;    Family History  Problem Relation Age of Onset  . Miscarriages / Stillbirths Sister   . Clotting disorder Sister   . Alcohol abuse Father   . Diabetes Father   . Hypertension Father   . Diabetes Maternal Grandfather     History  Substance Use Topics  . Smoking status: Former Games developer  . Smokeless tobacco: Not on file     Comment: Hookah 2-3 times per week  . Alcohol Use: No    Allergies: No Known Allergies  Prescriptions prior to admission  Medication Sig Dispense Refill  . Prenatal Vit-Fe Fumarate-FA (PRENATAL MULTIVITAMIN) TABS Take 1 tablet by  mouth daily.        Review of Systems  Constitutional: Negative.  Negative for fever and malaise/fatigue.  HENT: Positive for nosebleeds.   Eyes: Negative.   Respiratory: Positive for shortness of breath.   Cardiovascular: Positive for palpitations.  Gastrointestinal: Negative.   Genitourinary: Negative.   Musculoskeletal: Negative.   Skin: Negative.   Neurological: Positive for dizziness and headaches.  Psychiatric/Behavioral: The patient is nervous/anxious.    Physical Exam   Blood pressure 114/64, pulse 85, temperature 98 F (36.7 C), last menstrual period 07/27/2011, not currently breastfeeding.  Physical Exam  Constitutional: She is oriented to person, place, and time. She appears well-developed and well-nourished. No distress.  HENT:  Head: Normocephalic and atraumatic.  Eyes: Pupils are equal, round, and reactive to light.  Neck: Normal range of motion. Neck supple.  Cardiovascular: Normal rate, regular rhythm and normal heart sounds.   Respiratory: Effort normal and breath sounds normal.  GI: Soft.  Musculoskeletal: Normal range of motion.  Neurological: She is alert and oriented to person, place, and time.  Psychiatric: Her mood appears anxious.    MAU Course  Procedures  MDM EKG WNL O2 Sat 100 % Will give Vistaril and Tylenol #3 one tab Call Dr Jackelyn Knife with patients condition  Assessment and Plan   A: Vertigo/ suspect anxiety P: Send home with Vistaril and tylenol # 3 to take as needed [facility]  Shaquella Stamant, Rubbie BattiestLinda Miller 01/26/2013, 3:37 PM

## 2013-01-26 NOTE — MAU Note (Signed)
Nasal congestion and dryness, small amt of nasal blood at home, states sob and fast heartrate

## 2013-01-26 NOTE — Discharge Instructions (Signed)
Vertigo Vertigo means you feel like you or your surroundings are moving when they are not. Vertigo can be dangerous if it occurs when you are at work, driving, or performing difficult activities.  CAUSES  Vertigo occurs when there is a conflict of signals sent to your brain from the visual and sensory systems in your body. There are many different causes of vertigo, including:  Infections, especially in the inner ear.  A bad reaction to a drug or misuse of alcohol and medicines.  Withdrawal from drugs or alcohol.  Rapidly changing positions, such as lying down or rolling over in bed.  A migraine headache.  Decreased blood flow to the brain.  Increased pressure in the brain from a head injury, infection, tumor, or bleeding. SYMPTOMS  You may feel as though the world is spinning around or you are falling to the ground. Because your balance is upset, vertigo can cause nausea and vomiting. You may have involuntary eye movements (nystagmus). DIAGNOSIS  Vertigo is usually diagnosed by physical exam. If the cause of your vertigo is unknown, your caregiver may perform imaging tests, such as an MRI scan (magnetic resonance imaging). TREATMENT  Most cases of vertigo resolve on their own, without treatment. Depending on the cause, your caregiver may prescribe certain medicines. If your vertigo is related to body position issues, your caregiver may recommend movements or procedures to correct the problem. In rare cases, if your vertigo is caused by certain inner ear problems, you may need surgery. HOME CARE INSTRUCTIONS   Follow your caregiver's instructions.  Avoid driving.  Avoid operating heavy machinery.  Avoid performing any tasks that would be dangerous to you or others during a vertigo episode.  Tell your caregiver if you notice that certain medicines seem to be causing your vertigo. Some of the medicines used to treat vertigo episodes can actually make them worse in some people. SEEK  IMMEDIATE MEDICAL CARE IF:   Your medicines do not relieve your vertigo or are making it worse.  You develop problems with talking, walking, weakness, or using your arms, hands, or legs.  You develop severe headaches.  Your nausea or vomiting continues or gets worse.  You develop visual changes.  A family member notices behavioral changes.  Your condition gets worse. MAKE SURE YOU:  Understand these instructions.  Will watch your condition.  Will get help right away if you are not doing well or get worse. Document Released: 10/21/2004 Document Revised: 04/05/2011 Document Reviewed: 07/30/2010 ExitCare Patient Information 2014 [employer].  

## 2013-03-03 ENCOUNTER — Inpatient Hospital Stay (HOSPITAL_COMMUNITY)
Admission: AD | Admit: 2013-03-03 | Discharge: 2013-03-03 | Disposition: A | Discharge status: Home / Self Care | Payer: Medicaid Other | Source: Ambulatory Visit | Attending: Obstetrics and Gynecology | Admitting: Obstetrics and Gynecology

## 2013-03-03 ENCOUNTER — Telehealth (HOSPITAL_COMMUNITY): Payer: Self-pay | Admitting: *Deleted

## 2013-03-03 ENCOUNTER — Encounter (HOSPITAL_COMMUNITY): Payer: Self-pay

## 2013-03-03 DIAGNOSIS — O2240 Hemorrhoids in pregnancy, unspecified trimester: Secondary | ICD-10-CM

## 2013-03-03 DIAGNOSIS — K649 Unspecified hemorrhoids: Secondary | ICD-10-CM

## 2013-03-03 DIAGNOSIS — K644 Residual hemorrhoidal skin tags: Secondary | ICD-10-CM | POA: Insufficient documentation

## 2013-03-03 DIAGNOSIS — Z87891 Personal history of nicotine dependence: Secondary | ICD-10-CM | POA: Insufficient documentation

## 2013-03-03 DIAGNOSIS — O228X9 Other venous complications in pregnancy, unspecified trimester: Secondary | ICD-10-CM | POA: Insufficient documentation

## 2013-03-03 LAB — URINALYSIS, ROUTINE W REFLEX MICROSCOPIC
BILIRUBIN URINE: NEGATIVE
GLUCOSE, UA: NEGATIVE mg/dL
KETONES UR: NEGATIVE mg/dL
Nitrite: NEGATIVE
PH: 6.5 (ref 5.0–8.0)
Protein, ur: NEGATIVE mg/dL
Specific Gravity, Urine: <1.005 — ABNORMAL LOW (ref 1.005–1.030)
Urobilinogen, UA: 0.2 mg/dL (ref 0.0–1.0)

## 2013-03-03 LAB — URINE MICROSCOPIC-ADD ON

## 2013-03-03 MED ORDER — HYDROCORTISONE 2.5 % RE CREA: TOPICAL_CREAM | RECTAL | Status: DC

## 2013-03-03 NOTE — Discharge Instructions (Signed)
Constipation, Adult °Constipation is when a person has fewer than 3 bowel movements a week; has difficulty having a bowel movement; or has stools that are dry, hard, or larger than normal. As people grow older, constipation is more common. If you try to fix constipation with medicines that make you have a bowel movement (laxatives), the problem may get worse. Long-term laxative use may cause the muscles of the colon to become weak. A low-fiber diet, not taking in enough fluids, and taking certain medicines may make constipation worse. °CAUSES  °· Certain medicines, such as antidepressants, pain medicine, iron supplements, antacids, and water pills.   °· Certain diseases, such as diabetes, irritable bowel syndrome (IBS), thyroid disease, or depression.   °· Not drinking enough water.   °· Not eating enough fiber-rich foods.   °· Stress or travel. °· Lack of physical activity or exercise. °· Not going to the restroom when there is the urge to have a bowel movement. °· Ignoring the urge to have a bowel movement. °· Using laxatives too much. °SYMPTOMS  °· Having fewer than 3 bowel movements a week.   °· Straining to have a bowel movement.   °· Having hard, dry, or larger than normal stools.   °· Feeling full or bloated.   °· Pain in the lower abdomen. °· Not feeling relief after having a bowel movement. °DIAGNOSIS  °Your caregiver will take a medical history and perform a physical exam. Further testing may be done for severe constipation. Some tests may include:  °· A barium enema X-ray to examine your rectum, colon, and sometimes, your small intestine. °· A sigmoidoscopy to examine your lower colon. °· A colonoscopy to examine your entire colon. °TREATMENT  °Treatment will depend on the severity of your constipation and what is causing it. Some dietary treatments include drinking more fluids and eating more fiber-rich foods. Lifestyle treatments may include regular exercise. If these diet and lifestyle recommendations  do not help, your caregiver may recommend taking over-the-counter laxative medicines to help you have bowel movements. Prescription medicines may be prescribed if over-the-counter medicines do not work.  °HOME CARE INSTRUCTIONS  °· Increase dietary fiber in your diet, such as fruits, vegetables, whole grains, and beans. Limit high-fat and processed sugars in your diet, such as French fries, hamburgers, cookies, candies, and soda.   °· A fiber supplement may be added to your diet if you cannot get enough fiber from foods.   °· Drink enough fluids to keep your urine clear or pale yellow.   °· Exercise regularly or as directed by your caregiver.   °· Go to the restroom when you have the urge to go. Do not hold it. °· Only take medicines as directed by your caregiver. Do not take other medicines for constipation without talking to your caregiver first. °SEEK IMMEDIATE MEDICAL CARE IF:  °· You have bright red blood in your stool.   °· Your constipation lasts for more than 4 days or gets worse.   °· You have abdominal or rectal pain.   °· You have thin, pencil-like stools. °· You have unexplained weight loss. °MAKE SURE YOU:  °· Understand these instructions. °· Will watch your condition. °· Will get help right away if you are not doing well or get worse. °Document Released: 10/10/2003 Document Revised: 04/05/2011 Document Reviewed: 10/23/2012 °ExitCare® Patient Information ©2014 [employer]. ° °Hemorrhoids °Hemorrhoids are swollen veins around the rectum or anus. There are two types of hemorrhoids:  °· Internal hemorrhoids. These occur in the veins just inside the rectum. They may poke through to the   outside and become irritated and painful. °· External hemorrhoids. These occur in the veins outside the anus and can be felt as a painful swelling or hard lump near the anus. °CAUSES °· Pregnancy.   °· Obesity.   °· Constipation or diarrhea.   °· Straining to have a bowel movement.   °· Sitting for long periods on the  toilet. °· Heavy lifting or other activity that caused you to strain. °· Anal intercourse. °SYMPTOMS  °· Pain.   °· Anal itching or irritation.   °· Rectal bleeding.   °· Fecal leakage.   °· Anal swelling.   °· One or more lumps around the anus.   °DIAGNOSIS  °Your caregiver may be able to diagnose hemorrhoids by visual examination. Other examinations or tests that may be performed include:  °· Examination of the rectal area with a gloved hand (digital rectal exam).   °· Examination of anal canal using a small tube (scope).   °· A blood test if you have lost a significant amount of blood. °· A test to look inside the colon (sigmoidoscopy or colonoscopy). °TREATMENT °Most hemorrhoids can be treated at home. However, if symptoms do not seem to be getting better or if you have a lot of rectal bleeding, your caregiver may perform a procedure to help make the hemorrhoids get smaller or remove them completely. Possible treatments include:  °· Placing a rubber band at the base of the hemorrhoid to cut off the circulation (rubber band ligation).   °· Injecting a chemical to shrink the hemorrhoid (sclerotherapy).   °· Using a tool to burn the hemorrhoid (infrared light therapy).   °· Surgically removing the hemorrhoid (hemorrhoidectomy).   °· Stapling the hemorrhoid to block blood flow to the tissue (hemorrhoid stapling).   °HOME CARE INSTRUCTIONS  °· Eat foods with fiber, such as whole grains, beans, nuts, fruits, and vegetables. Ask your doctor about taking products with added fiber in them (fiber supplements). °· Increase fluid intake. Drink enough water and fluids to keep your urine clear or pale yellow.   °· Exercise regularly.   °· Go to the bathroom when you have the urge to have a bowel movement. Do not wait.   °· Avoid straining to have bowel movements.   °· Keep the anal area dry and clean. Use wet toilet paper or moist towelettes after a bowel movement.   °· Medicated creams and suppositories may be used or  applied as directed.   °· Only take over-the-counter or prescription medicines as directed by your caregiver.   °· Take warm sitz baths for 15 20 minutes, 3 4 times a day to ease pain and discomfort.   °· Place ice packs on the hemorrhoids if they are tender and swollen. Using ice packs between sitz baths may be helpful.   °· Put ice in a plastic bag.   °· Place a towel between your skin and the bag.   °· Leave the ice on for 15 20 minutes, 3 4 times a day.   °· Do not use a donut-shaped pillow or sit on the toilet for long periods. This increases blood pooling and pain.   °SEEK MEDICAL CARE IF: °· You have increasing pain and swelling that is not controlled by treatment or medicine. °· You have uncontrolled bleeding. °· You have difficulty or you are unable to have a bowel movement. °· You have pain or inflammation outside the area of the hemorrhoids. °MAKE SURE YOU: °· Understand these instructions. °· Will watch your condition. °· Will get help right away if you are not doing well or get worse. °Document Released: 01/09/2000 Document Revised: 12/29/2011 Document Reviewed: 11/16/2011 °ExitCare®   Patient Information ©2014 [employer]. ° °

## 2013-03-03 NOTE — MAU Note (Signed)
Pt presents with complaints of hemorrhoids that she noticed last night. She says that she started having pain in her left lower side today. Denies any vaginal bleeding or discharge

## 2013-03-03 NOTE — MAU Provider Note (Signed)
  History     CSN: [id number]  Arrival date and time: 03/03/13 1340   First Provider Initiated Contact with Patient 03/03/13 1653      Chief Complaint  Patient presents with  . Hemorrhoids   HPI Comments: Marissa Carpenter 28 y.o. [id number] presents to MAU with hemorrhoids that are very painful. She sees Dr Ambrose MantleHenley. She is 28 weeks and 1 day pregnant. She has taken no meds and has a maternity belt.     Past Medical History  Diagnosis Date  . Miscarriage 08/26/2010  . Diabetes mellitus without complication   . Gestational diabetes     insulin  . Dysrhythmia     palpitations with pregnancy  . Gestational diabetes mellitus, class A2 05/08/2012    Past Surgical History  Procedure Laterality Date  . Cesarean section N/A 05/09/2012    Procedure: Primary CESAREAN SECTION of baby boy  at 0435 APGAR 9/9;  Surgeon: Lavina Hammanodd Meisinger, MD;  Location: [facility];  Service: Obstetrics;  Laterality: N/A;    Family History  Problem Relation Age of Onset  . Miscarriages / Stillbirths Sister   . Clotting disorder Sister   . Alcohol abuse Father   . Diabetes Father   . Hypertension Father   . Diabetes Maternal Grandfather     History  Substance Use Topics  . Smoking status: Former Games developermoker  . Smokeless tobacco: Not on file     Comment: Hookah 2-3 times per week  . Alcohol Use: No    Allergies: No Known Allergies  Prescriptions prior to admission  Medication Sig Dispense Refill  . acetaminophen-codeine (TYLENOL #3) 300-30 MG per tablet Take 1-2 tablets by mouth every 6 (six) hours as needed for moderate pain.  15 tablet  1  . hydrOXYzine (ATARAX/VISTARIL) 25 MG tablet Take 1 tablet (25 mg total) by mouth every 6 (six) hours.  30 tablet  1  . Prenatal Vit-Fe Fumarate-FA (PRENATAL MULTIVITAMIN) TABS Take 1 tablet by mouth daily.        Review of Systems  Constitutional: Negative.   Respiratory: Negative.   Cardiovascular: Negative.   Gastrointestinal:       Moderately severe hemorrhoids   Genitourinary: Negative.   Musculoskeletal: Negative.   Skin: Negative.   Neurological: Negative.   Endo/Heme/Allergies: Negative.   Psychiatric/Behavioral: Negative.    Physical Exam   Blood pressure 129/74, pulse 98, temperature 98.2 F (36.8 C), resp. rate 18, last menstrual period 07/27/2011, not currently breastfeeding.  Physical Exam  Constitutional: She appears well-developed and well-nourished. No distress.  Arabic interpreter used on language line   HENT:  Head: Normocephalic and atraumatic.  Eyes: Pupils are equal, round, and reactive to light.  Neck: Normal range of motion. Neck supple.  GI: Soft. She exhibits no distension. There is no tenderness. There is no rebound.  Genitourinary:  Genitalia: External she has a cluster of 5 non thrombosed hemorrhoids  Musculoskeletal: Normal range of motion.  Neurological: She is alert.  Skin: Skin is warm and dry.  Psychiatric: She has a normal mood and affect. Her behavior is normal. Judgment and thought content normal.    MAU Course  Procedures  MDM  Dr Benjaman PottHenley Called and agreed to care plan  Assessment and Plan   A: Hemorrhoids in Pregnancy  P: Anusol HC as directed Tylenol # 3 po q4 hours prn Ice packs Hot soaks Discussed comfort measures Follow up in office  Carolynn ServeBarefoot, Marissa Carpenter 03/03/2013, 5:19 PM

## 2013-03-04 LAB — URINE CULTURE: Colony Count: 90000

## 2013-05-05 ENCOUNTER — Inpatient Hospital Stay (HOSPITAL_COMMUNITY)
Admission: AD | Admit: 2013-05-05 | Discharge: 2013-05-05 | Disposition: A | Discharge status: Home / Self Care | Payer: Medicaid Other | Source: Ambulatory Visit | Attending: Obstetrics and Gynecology | Admitting: Obstetrics and Gynecology

## 2013-05-05 ENCOUNTER — Encounter (HOSPITAL_COMMUNITY): Payer: Self-pay | Admitting: Family

## 2013-05-05 DIAGNOSIS — O368130 Decreased fetal movements, third trimester, not applicable or unspecified: Secondary | ICD-10-CM

## 2013-05-05 DIAGNOSIS — R002 Palpitations: Secondary | ICD-10-CM | POA: Insufficient documentation

## 2013-05-05 DIAGNOSIS — IMO0001 Reserved for inherently not codable concepts without codable children: Secondary | ICD-10-CM

## 2013-05-05 DIAGNOSIS — Z87891 Personal history of nicotine dependence: Secondary | ICD-10-CM | POA: Insufficient documentation

## 2013-05-05 DIAGNOSIS — O368190 Decreased fetal movements, unspecified trimester, not applicable or unspecified: Secondary | ICD-10-CM | POA: Insufficient documentation

## 2013-05-05 DIAGNOSIS — O24419 Gestational diabetes mellitus in pregnancy, unspecified control: Secondary | ICD-10-CM

## 2013-05-05 DIAGNOSIS — Z794 Long term (current) use of insulin: Secondary | ICD-10-CM | POA: Insufficient documentation

## 2013-05-05 DIAGNOSIS — O99810 Abnormal glucose complicating pregnancy: Secondary | ICD-10-CM | POA: Insufficient documentation

## 2013-05-05 NOTE — Discharge Instructions (Signed)

## 2013-05-05 NOTE — MAU Provider Note (Signed)
Chief Complaint:  Decreased Fetal Movement  First Provider Initiated Contact with Patient 05/05/13 1344     HPI: Marissa Carpenter is a 28 y.o. [id number] at 5518w1d who presents to maternity admissions reporting decreased FM since this morning.   Mild UC's. Denies leakage of fluid or vaginal bleeding. Pos fetal movement since arrival to MAU.   Pregnancy Course: A2GDM on insulin. Pt is in antenatal testing.   Past Medical History: Past Medical History  Diagnosis Date  . Miscarriage 08/26/2010  . Diabetes mellitus without complication   . Gestational diabetes     insulin  . Dysrhythmia     palpitations with pregnancy  . Gestational diabetes mellitus, class A2 05/08/2012    Past obstetric history: OB History  Gravida Para Term Preterm AB SAB TAB Ectopic Multiple Living  4 1 1  2 2    1     # Outcome Date GA Lbr Len/2nd Weight Sex Delivery Anes PTL Lv  4 CUR           3 TRM 05/09/12 5357w6d  4.12 kg (9 lb 1.3 oz) M CS-Vac EPI  Y  2 SAB 2012          1 SAB 2012              Past Surgical History: Past Surgical History  Procedure Laterality Date  . Cesarean section N/A 05/09/2012    Procedure: Primary CESAREAN SECTION of baby boy  at 0435 APGAR 9/9;  Surgeon: Lavina Hammanodd Meisinger, MD;  Location: [facility];  Service: Obstetrics;  Laterality: N/A;     Family History: Family History  Problem Relation Age of Onset  . Miscarriages / Stillbirths Sister   . Clotting disorder Sister   . Alcohol abuse Father   . Diabetes Father   . Hypertension Father   . Diabetes Maternal Grandfather     Social History: History  Substance Use Topics  . Smoking status: Former Games developermoker  . Smokeless tobacco: Not on file     Comment: Hookah 2-3 times per week  . Alcohol Use: No    Allergies: No Known Allergies  Meds:  Prescriptions prior to admission  Medication Sig Dispense Refill  . insulin aspart (NOVOLOG) 100 UNIT/ML injection Inject 4 Units into the skin daily.      . insulin NPH Human (HUMULIN N,NOVOLIN  N) 100 UNIT/ML injection Inject 26 Units into the skin 2 (two) times daily.      . Prenatal Vit-Fe Fumarate-FA (PRENATAL MULTIVITAMIN) TABS Take 1 tablet by mouth daily.        ROS: Pertinent findings in history of present illness.  Physical Exam  Blood pressure 135/70, pulse 91, temperature 97.8 F (36.6 C), temperature source Oral, resp. rate 18, height 5\' 2"  (1.575 m), weight 104.781 kg (231 lb), not currently breastfeeding. GENERAL: Well-developed, well-nourished female in no acute distress.  HEENT: normocephalic HEART: normal rate RESP: normal effort ABDOMEN: Soft, non-tender, gravid S>D. EXTREMITIES: Nontender, no edema NEURO: alert and oriented SPECULUM EXAM: Deferred   FHT:  Baseline 120 , moderate variability, accelerations present, no decelerations Contractions: UI   Labs: No results found for this or any previous visit (from the past 24 hour(s)).  Imaging:  No results found.   Assessment: 1. Decreased fetal movement in pregnancy in third trimester, antepartum   2. Insulin dependent gestational diabetes mellitus, antepartum     Plan: Discharge home in stable condition per consult w/ Dr. Ambrose MantleHenley. Labor precautions and fetal kick counts.     Follow-up  Information   Follow up with [facility] On 05/07/2013. (As scheduled or As needed If symptoms worsen)    Contact information:   [address]  [address] [place] [address] [phone]       Follow up with [facility]. (As needed, If symptoms worsen)    Contact information:   [address] [id number] [place] [address] [phone]       Medication List         insulin aspart 100 UNIT/ML injection  Commonly known as:  novoLOG  Inject 4 Units into the skin daily.     insulin NPH Human 100 UNIT/ML injection  Commonly known as:  HUMULIN N,NOVOLIN N  Inject 26 Units into the skin 2 (two) times daily.     prenatal multivitamin  Tabs tablet  Take 1 tablet by mouth daily.        [place], CNM 05/05/2013 1:51 PM

## 2013-05-05 NOTE — MAU Note (Signed)
Pt presents with complaints of a decrease in fetal movement since this morning.

## 2013-05-05 NOTE — MAU Note (Signed)
28 yo, G4P1 at 313w1d, presents with c/o decreased fetal movement today; reports +FM last night. Denies pain, LOF, VB, contractions.  History significant for gestational DM managed with insulin.

## 2013-05-16 ENCOUNTER — Encounter (HOSPITAL_COMMUNITY): Payer: Self-pay

## 2013-05-17 ENCOUNTER — Encounter (HOSPITAL_COMMUNITY)
Admission: RE | Admit: 2013-05-17 | Discharge: 2013-05-17 | Disposition: A | Discharge status: Home / Self Care | Payer: Medicaid Other | Source: Ambulatory Visit | Attending: Obstetrics and Gynecology | Admitting: Obstetrics and Gynecology

## 2013-05-17 ENCOUNTER — Encounter (HOSPITAL_COMMUNITY): Payer: Self-pay

## 2013-05-17 HISTORY — DX: Complete or unspecified spontaneous abortion without complication: O03.9

## 2013-05-17 HISTORY — DX: Headache: R51

## 2013-05-17 LAB — CBC
HEMATOCRIT: 38.3 % (ref 36.0–46.0)
HEMOGLOBIN: 12.7 g/dL (ref 12.0–15.0)
MCH: 27.8 pg (ref 26.0–34.0)
MCHC: 33.2 g/dL (ref 30.0–36.0)
MCV: 83.8 fL (ref 78.0–100.0)
Platelets: 225 10*3/uL (ref 150–400)
RBC: 4.57 MIL/uL (ref 3.87–5.11)
RDW: 17.1 % — ABNORMAL HIGH (ref 11.5–15.5)
WBC: 14.6 10*3/uL — AB (ref 4.0–10.5)

## 2013-05-17 LAB — RPR

## 2013-05-17 LAB — BASIC METABOLIC PANEL
BUN: 8 mg/dL (ref 6–23)
CHLORIDE: 101 meq/L (ref 96–112)
CO2: 23 mEq/L (ref 19–32)
Calcium: 9.4 mg/dL (ref 8.4–10.5)
Creatinine, Ser: 0.5 mg/dL (ref 0.50–1.10)
GFR calc Af Amer: >90 mL/min (ref >90)
GFR calc non Af Amer: >90 mL/min (ref >90)
Glucose, Bld: 119 mg/dL — ABNORMAL HIGH (ref 70–99)
POTASSIUM: 3.7 meq/L (ref 3.7–5.3)
Sodium: 137 mEq/L (ref 137–147)

## 2013-05-17 NOTE — Pre-Procedure Instructions (Signed)
Print production plannerArabic translator used for [employer] appointment - Hilton CorkSerra Gabralla, Interpreter.

## 2013-05-17 NOTE — Patient Instructions (Addendum)
   Your procedure is scheduled on: Friday, April 24  [facility] at:  6 AM Pick up the phone at the desk and dial [phone] and inform us of your arrival.  Please call this number if you have any problems the morning of surgery: [phone]  Remember: Do not eat or drink after midnight:  Thursday Take these medicines the morning of surgery with a SIP OF WATER:  None.  Patient instructed to 1/2 her night time dose tonight (Novolog 2 units, Humulin N 21 Units).  Patient instructed to withhold diabetes medication on Friday, day of surgery.  Do not wear jewelry, make-up, or FINGER nail polish No metal in your hair or on your body. Do not wear lotions, powders, perfumes.  You may wear deodorant.  Do not bring valuables to the hospital. Contacts, dentures or bridgework may not be worn into surgery.  Leave suitcase in the car. After Surgery it may be brought to your room. For patients being admitted to the hospital, checkout time is 11:00am the day of discharge.  Home with husband  Lourena Simmondsasser cell [phone]

## 2013-05-17 NOTE — H&P (Signed)
Marissa Carpenter is a 28 y.o. female G4P1021 at 6339 weeks (EDD 05/25/2013 by 6 week US) presenting for scheduled repeat c-section, declines TOL. Prenatal care complicated by GDM requiring insulin to control.  Currently on 34NPH/12novolog q AM 44NPH and 6novolog q PM  With reasonable control.  No other significant issues. Maternal Medical History:  Contractions: Frequency: irregular.   Perceived severity is mild.    Fetal activity: Perceived fetal activity is normal.    Prenatal Complications - Diabetes: gestational.    OB History   Grav Para Term Preterm Abortions TAB SAB Ect Mult Living   4 1 1  2  2   1     2012 SAB  x 2 LTCS 2014 9#1oz   Past Medical History  Diagnosis Date  . Miscarriage 08/26/2010  . Diabetes mellitus without complication   . Gestational diabetes     insulin  . Gestational diabetes mellitus, class A2 05/08/2012  . SAB (spontaneous abortion) 2012    x 2 in 2012 - no surgery required  . GERD (gastroesophageal reflux disease)     with pregnancy only - pecid  . Headache(784.0)     otc med prn   Past Surgical History  Procedure Laterality Date  . Cesarean section N/A 05/09/2012    Procedure: Primary CESAREAN SECTION of baby boy  at 0435 APGAR 9/9;  Surgeon: Lavina Hammanodd Meisinger, MD;  Location: [facility];  Service: Obstetrics;  Laterality: N/A;  . Tonsillectomy     Family History: family history includes Alcohol abuse in her father; Clotting disorder in her sister; Diabetes in her father and maternal grandfather; Hypertension in her father; Miscarriages / [place]births in her sister. Social History:  reports that she has never smoked. She quit smokeless tobacco use about 8 months ago. She reports that she does not drink alcohol or use illicit drugs.   Prenatal Transfer Tool  Maternal Diabetes: Yes:  Diabetes Type:  Insulin/Medication controlled Genetic Screening: Normal Maternal Ultrasounds/Referrals: Normal Fetal Ultrasounds or other Referrals:  None Maternal Substance Abuse:   No Significant Maternal Medications:  None Significant Maternal Lab Results:  None Other Comments:  None  ROS    not currently breastfeeding. Maternal Exam:  Uterine Assessment: Contraction strength is mild.  Abdomen: Patient reports no abdominal tenderness. Surgical scars: low transverse.   Introitus: Normal vulva. Normal vagina.    Physical Exam  Constitutional: She is oriented to person, place, and time. She appears well-developed and well-nourished.  Cardiovascular: Normal rate and regular rhythm.   Respiratory: Effort normal and breath sounds normal.  GI: Soft. Bowel sounds are normal.  Genitourinary: Vagina normal and uterus normal.  Neurological: She is alert and oriented to person, place, and time.  Psychiatric: She has a normal mood and affect. Her behavior is normal.    Prenatal labs: ABO, Rh: --/--/A POS (04/23 1030) Antibody: NEG (04/23 1030) Rubella: Immune (10/09 0000) RPR: NON REAC (04/23 1030)  HBsAg: Negative (10/09 0000)  HIV: Non-reactive (10/09 0000)  GBS:   negative One hour 199, began fingersticks First trimester screen and AFP WNL CF negative   Assessment/Plan: Patient was counseled regarding the risks and benefits of C-section and the procedure was reviewed in detail. Risks of bleeding, infection, and possible damage to bowel and bladder were reviewed, The patient would accept a blood transfusion if needed. She desires to proceed.     Oliver PilaKathy W Alfreddie Consalvo 05/17/2013, 5:45 PM

## 2013-05-18 ENCOUNTER — Inpatient Hospital Stay (HOSPITAL_COMMUNITY): Payer: Medicaid Other | Admitting: Anesthesiology

## 2013-05-18 ENCOUNTER — Encounter (HOSPITAL_COMMUNITY): Payer: Self-pay | Admitting: Anesthesiology

## 2013-05-18 ENCOUNTER — Encounter (HOSPITAL_COMMUNITY)
Admission: RE | Disposition: A | Discharge status: Home / Self Care | Payer: Self-pay | Source: Ambulatory Visit | Attending: Obstetrics and Gynecology

## 2013-05-18 ENCOUNTER — Encounter (HOSPITAL_COMMUNITY): Payer: Medicaid Other | Admitting: Anesthesiology

## 2013-05-18 ENCOUNTER — Inpatient Hospital Stay (HOSPITAL_COMMUNITY)
Admission: RE | Admit: 2013-05-18 | Discharge: 2013-05-20 | DRG: 765 | Disposition: A | Discharge status: Home / Self Care | Payer: Medicaid Other | Source: Ambulatory Visit | Attending: Obstetrics and Gynecology | Admitting: Obstetrics and Gynecology

## 2013-05-18 DIAGNOSIS — O34219 Maternal care for unspecified type scar from previous cesarean delivery: Principal | ICD-10-CM | POA: Diagnosis present

## 2013-05-18 DIAGNOSIS — O99214 Obesity complicating childbirth: Secondary | ICD-10-CM

## 2013-05-18 DIAGNOSIS — E669 Obesity, unspecified: Secondary | ICD-10-CM | POA: Diagnosis present

## 2013-05-18 DIAGNOSIS — Z794 Long term (current) use of insulin: Secondary | ICD-10-CM

## 2013-05-18 DIAGNOSIS — Z8249 Family history of ischemic heart disease and other diseases of the circulatory system: Secondary | ICD-10-CM

## 2013-05-18 DIAGNOSIS — O99814 Abnormal glucose complicating childbirth: Secondary | ICD-10-CM | POA: Diagnosis present

## 2013-05-18 DIAGNOSIS — Z833 Family history of diabetes mellitus: Secondary | ICD-10-CM

## 2013-05-18 DIAGNOSIS — Z6841 Body Mass Index (BMI) 40.0 and over, adult: Secondary | ICD-10-CM

## 2013-05-18 DIAGNOSIS — Z87891 Personal history of nicotine dependence: Secondary | ICD-10-CM

## 2013-05-18 DIAGNOSIS — K219 Gastro-esophageal reflux disease without esophagitis: Secondary | ICD-10-CM | POA: Diagnosis present

## 2013-05-18 DIAGNOSIS — Z98891 History of uterine scar from previous surgery: Secondary | ICD-10-CM

## 2013-05-18 LAB — GLUCOSE, CAPILLARY
GLUCOSE-CAPILLARY: 79 mg/dL (ref 70–99)
Glucose-Capillary: 87 mg/dL (ref 70–99)
Glucose-Capillary: 63 mg/dL — ABNORMAL LOW (ref 70–99)
Glucose-Capillary: 80 mg/dL (ref 70–99)
Glucose-Capillary: 79 mg/dL (ref 70–99)
Glucose-Capillary: 106 mg/dL — ABNORMAL HIGH (ref 70–99)

## 2013-05-18 LAB — PREPARE RBC (CROSSMATCH)

## 2013-05-18 SURGERY — Surgical Case
Anesthesia: Epidural

## 2013-05-18 MED ORDER — DIPHENHYDRAMINE HCL 50 MG/ML IJ SOLN: 12.5000 mg | INTRAMUSCULAR | Status: DC | PRN

## 2013-05-18 MED ORDER — NALBUPHINE HCL 10 MG/ML IJ SOLN
5.0000 mg | INTRAMUSCULAR | Status: DC | PRN
  Filled 2013-05-18: qty 1

## 2013-05-18 MED ORDER — SIMETHICONE 80 MG PO CHEW
80.0000 mg | CHEWABLE_TABLET | Freq: Three times a day (TID) | ORAL | Status: DC
  Administered 2013-05-18 – 2013-05-20 (×4): 80 mg via ORAL
  Filled 2013-05-18 (×4): qty 1

## 2013-05-18 MED ORDER — KETOROLAC TROMETHAMINE 30 MG/ML IJ SOLN: 30.0000 mg | Freq: Four times a day (QID) | INTRAMUSCULAR | Status: DC | PRN

## 2013-05-18 MED ORDER — PRENATAL MULTIVITAMIN CH
1.0000 | ORAL_TABLET | Freq: Every day | ORAL | Status: DC
  Administered 2013-05-19 – 2013-05-20 (×2): 1 via ORAL
  Filled 2013-05-18 (×2): qty 1

## 2013-05-18 MED ORDER — FENTANYL CITRATE 0.05 MG/ML IJ SOLN
INTRAMUSCULAR | Status: AC
  Filled 2013-05-18: qty 2

## 2013-05-18 MED ORDER — LANOLIN HYDROUS EX OINT: 1.0000 "application " | TOPICAL_OINTMENT | CUTANEOUS | Status: DC | PRN

## 2013-05-18 MED ORDER — ZOLPIDEM TARTRATE 5 MG PO TABS: 5.0000 mg | ORAL_TABLET | Freq: Every evening | ORAL | Status: DC | PRN

## 2013-05-18 MED ORDER — DIBUCAINE 1 % RE OINT: 1.0000 "application " | TOPICAL_OINTMENT | RECTAL | Status: DC | PRN

## 2013-05-18 MED ORDER — SCOPOLAMINE 1 MG/3DAYS TD PT72
1.0000 | MEDICATED_PATCH | Freq: Once | TRANSDERMAL | Status: DC
Start: 2013-05-18 — End: 2013-05-18
  Filled 2013-05-18: qty 1

## 2013-05-18 MED ORDER — NALOXONE HCL 0.4 MG/ML IJ SOLN: 0.4000 mg | INTRAMUSCULAR | Status: DC | PRN

## 2013-05-18 MED ORDER — METOCLOPRAMIDE HCL 5 MG/ML IJ SOLN: 10.0000 mg | Freq: Three times a day (TID) | INTRAMUSCULAR | Status: DC | PRN

## 2013-05-18 MED ORDER — 0.9 % SODIUM CHLORIDE (POUR BTL) OPTIME
TOPICAL | Status: DC | PRN
  Administered 2013-05-18: 1000 mL

## 2013-05-18 MED ORDER — DIPHENHYDRAMINE HCL 50 MG/ML IJ SOLN: 25.0000 mg | INTRAMUSCULAR | Status: DC | PRN

## 2013-05-18 MED ORDER — KETOROLAC TROMETHAMINE 30 MG/ML IJ SOLN
INTRAMUSCULAR | Status: AC
  Filled 2013-05-18: qty 1

## 2013-05-18 MED ORDER — SODIUM CHLORIDE 0.9 % IJ SOLN: 3.0000 mL | INTRAMUSCULAR | Status: DC | PRN

## 2013-05-18 MED ORDER — FENTANYL CITRATE 0.05 MG/ML IJ SOLN
25.0000 ug | INTRAMUSCULAR | Status: DC | PRN
  Administered 2013-05-18 (×2): 25 ug via INTRAVENOUS

## 2013-05-18 MED ORDER — BUPIVACAINE IN DEXTROSE 0.75-8.25 % IT SOLN
INTRATHECAL | Status: DC | PRN
  Administered 2013-05-18: 1.5 mL via INTRATHECAL

## 2013-05-18 MED ORDER — FENTANYL CITRATE 0.05 MG/ML IJ SOLN: 25.0000 ug | INTRAMUSCULAR | Status: DC | PRN

## 2013-05-18 MED ORDER — OXYTOCIN 10 UNIT/ML IJ SOLN
INTRAMUSCULAR | Status: AC
  Filled 2013-05-18: qty 4

## 2013-05-18 MED ORDER — DIPHENHYDRAMINE HCL 25 MG PO CAPS
25.0000 mg | ORAL_CAPSULE | ORAL | Status: DC | PRN
  Filled 2013-05-18: qty 1

## 2013-05-18 MED ORDER — NALOXONE HCL 1 MG/ML IJ SOLN: 1.0000 ug/kg/h | INTRAVENOUS | Status: DC | PRN

## 2013-05-18 MED ORDER — MEPERIDINE HCL 25 MG/ML IJ SOLN: 6.2500 mg | INTRAMUSCULAR | Status: DC | PRN

## 2013-05-18 MED ORDER — DIPHENHYDRAMINE HCL 25 MG PO CAPS: 25.0000 mg | ORAL_CAPSULE | ORAL | Status: DC | PRN

## 2013-05-18 MED ORDER — WITCH HAZEL-GLYCERIN EX PADS: 1.0000 "application " | MEDICATED_PAD | CUTANEOUS | Status: DC | PRN

## 2013-05-18 MED ORDER — LACTATED RINGERS IV SOLN
INTRAVENOUS | Status: DC
  Administered 2013-05-18 (×3): via INTRAVENOUS

## 2013-05-18 MED ORDER — ONDANSETRON HCL 4 MG PO TABS: 4.0000 mg | ORAL_TABLET | ORAL | Status: DC | PRN

## 2013-05-18 MED ORDER — DIPHENHYDRAMINE HCL 50 MG/ML IJ SOLN
INTRAMUSCULAR | Status: AC
  Filled 2013-05-18: qty 1

## 2013-05-18 MED ORDER — NALBUPHINE HCL 10 MG/ML IJ SOLN
5.0000 mg | INTRAMUSCULAR | Status: DC | PRN
  Administered 2013-05-18: 10 mg via INTRAVENOUS

## 2013-05-18 MED ORDER — LACTATED RINGERS IV SOLN: INTRAVENOUS | Status: DC

## 2013-05-18 MED ORDER — NALOXONE HCL 1 MG/ML IJ SOLN
1.0000 ug/kg/h | INTRAVENOUS | Status: DC | PRN
  Filled 2013-05-18: qty 2

## 2013-05-18 MED ORDER — CEFAZOLIN SODIUM-DEXTROSE 2-3 GM-% IV SOLR
2.0000 g | INTRAVENOUS | Status: AC
  Administered 2013-05-18: 2 g via INTRAVENOUS

## 2013-05-18 MED ORDER — MENTHOL 3 MG MT LOZG: 1.0000 | LOZENGE | OROMUCOSAL | Status: DC | PRN

## 2013-05-18 MED ORDER — FENTANYL CITRATE 0.05 MG/ML IJ SOLN
INTRAMUSCULAR | Status: DC | PRN
  Administered 2013-05-18: 25 ug via INTRATHECAL

## 2013-05-18 MED ORDER — ONDANSETRON HCL 4 MG/2ML IJ SOLN: 4.0000 mg | Freq: Three times a day (TID) | INTRAMUSCULAR | Status: DC | PRN

## 2013-05-18 MED ORDER — SIMETHICONE 80 MG PO CHEW: 80.0000 mg | CHEWABLE_TABLET | ORAL | Status: DC | PRN

## 2013-05-18 MED ORDER — LACTATED RINGERS IV SOLN
Freq: Once | INTRAVENOUS | Status: AC
  Administered 2013-05-18: 07:00:00 via INTRAVENOUS

## 2013-05-18 MED ORDER — MORPHINE SULFATE 0.5 MG/ML IJ SOLN
INTRAMUSCULAR | Status: AC
  Filled 2013-05-18: qty 10

## 2013-05-18 MED ORDER — DIPHENHYDRAMINE HCL 25 MG PO CAPS: 25.0000 mg | ORAL_CAPSULE | Freq: Four times a day (QID) | ORAL | Status: DC | PRN

## 2013-05-18 MED ORDER — KETOROLAC TROMETHAMINE 30 MG/ML IJ SOLN
30.0000 mg | Freq: Four times a day (QID) | INTRAMUSCULAR | Status: DC | PRN
  Administered 2013-05-18: 30 mg via INTRAVENOUS

## 2013-05-18 MED ORDER — PHENYLEPHRINE 8 MG IN D5W 100 ML (0.08MG/ML) PREMIX OPTIME
INJECTION | INTRAVENOUS | Status: DC | PRN
  Administered 2013-05-18: 60 ug/min via INTRAVENOUS

## 2013-05-18 MED ORDER — LACTATED RINGERS IV SOLN
INTRAVENOUS | Status: DC | PRN
  Administered 2013-05-18: 08:00:00 via INTRAVENOUS

## 2013-05-18 MED ORDER — OXYTOCIN 10 UNIT/ML IJ SOLN
40.0000 [IU] | INTRAVENOUS | Status: DC | PRN
  Administered 2013-05-18: 40 [IU] via INTRAVENOUS

## 2013-05-18 MED ORDER — DIPHENHYDRAMINE HCL 50 MG/ML IJ SOLN
12.5000 mg | INTRAMUSCULAR | Status: DC | PRN
  Administered 2013-05-18: 12.5 mg via INTRAVENOUS

## 2013-05-18 MED ORDER — NALBUPHINE HCL 10 MG/ML IJ SOLN: 5.0000 mg | INTRAMUSCULAR | Status: DC | PRN

## 2013-05-18 MED ORDER — SCOPOLAMINE 1 MG/3DAYS TD PT72
1.0000 | MEDICATED_PATCH | Freq: Once | TRANSDERMAL | Status: DC
  Filled 2013-05-18: qty 1

## 2013-05-18 MED ORDER — SENNOSIDES-DOCUSATE SODIUM 8.6-50 MG PO TABS
2.0000 | ORAL_TABLET | ORAL | Status: DC
  Administered 2013-05-18 – 2013-05-19 (×2): 2 via ORAL
  Filled 2013-05-18 (×2): qty 2

## 2013-05-18 MED ORDER — KETOROLAC TROMETHAMINE 30 MG/ML IJ SOLN
30.0000 mg | Freq: Four times a day (QID) | INTRAMUSCULAR | Status: AC | PRN
  Administered 2013-05-18: 30 mg via INTRAMUSCULAR
  Filled 2013-05-18: qty 1

## 2013-05-18 MED ORDER — SCOPOLAMINE 1 MG/3DAYS TD PT72
MEDICATED_PATCH | TRANSDERMAL | Status: AC
  Administered 2013-05-18: 1.5 mg via TRANSDERMAL
  Filled 2013-05-18: qty 1

## 2013-05-18 MED ORDER — IBUPROFEN 600 MG PO TABS
600.0000 mg | ORAL_TABLET | Freq: Four times a day (QID) | ORAL | Status: DC
  Administered 2013-05-18 – 2013-05-20 (×7): 600 mg via ORAL
  Filled 2013-05-18 (×7): qty 1

## 2013-05-18 MED ORDER — SIMETHICONE 80 MG PO CHEW
80.0000 mg | CHEWABLE_TABLET | ORAL | Status: DC
  Administered 2013-05-18 – 2013-05-19 (×2): 80 mg via ORAL
  Filled 2013-05-18 (×2): qty 1

## 2013-05-18 MED ORDER — ONDANSETRON HCL 4 MG/2ML IJ SOLN
INTRAMUSCULAR | Status: DC | PRN
  Administered 2013-05-18: 4 mg via INTRAVENOUS

## 2013-05-18 MED ORDER — OXYTOCIN 40 UNITS IN LACTATED RINGERS INFUSION - SIMPLE MED: 62.5000 mL/h | INTRAVENOUS | Status: AC

## 2013-05-18 MED ORDER — ONDANSETRON HCL 4 MG/2ML IJ SOLN
INTRAMUSCULAR | Status: AC
  Filled 2013-05-18: qty 2

## 2013-05-18 MED ORDER — OXYCODONE-ACETAMINOPHEN 5-325 MG PO TABS
1.0000 | ORAL_TABLET | ORAL | Status: DC | PRN
  Administered 2013-05-19 (×3): 1 via ORAL
  Filled 2013-05-18 (×4): qty 1

## 2013-05-18 MED ORDER — SCOPOLAMINE 1 MG/3DAYS TD PT72
1.0000 | MEDICATED_PATCH | Freq: Once | TRANSDERMAL | Status: DC
  Administered 2013-05-18: 1.5 mg via TRANSDERMAL

## 2013-05-18 MED ORDER — CEFAZOLIN SODIUM-DEXTROSE 2-3 GM-% IV SOLR
INTRAVENOUS | Status: AC
  Filled 2013-05-18: qty 50

## 2013-05-18 MED ORDER — ONDANSETRON HCL 4 MG/2ML IJ SOLN: 4.0000 mg | INTRAMUSCULAR | Status: DC | PRN

## 2013-05-18 MED ORDER — MORPHINE SULFATE (PF) 0.5 MG/ML IJ SOLN
INTRAMUSCULAR | Status: DC | PRN
  Administered 2013-05-18: .15 mg via INTRATHECAL

## 2013-05-18 SURGICAL SUPPLY — 35 items
BENZOIN TINCTURE PRP APPL 2/3 (GAUZE/BANDAGES/DRESSINGS) ×3 IMPLANT
CLAMP CORD UMBIL (MISCELLANEOUS) IMPLANT
CLOSURE WOUND 1/2 X4 (GAUZE/BANDAGES/DRESSINGS) ×1
CLOTH BEACON ORANGE TIMEOUT ST (SAFETY) ×3 IMPLANT
DRAPE LG THREE QUARTER DISP (DRAPES) IMPLANT
DRSG OPSITE POSTOP 4X10 (GAUZE/BANDAGES/DRESSINGS) ×3 IMPLANT
DURAPREP 26ML APPLICATOR (WOUND CARE) ×3 IMPLANT
ELECT REM PT RETURN 9FT ADLT (ELECTROSURGICAL) ×3
ELECTRODE REM PT RTRN 9FT ADLT (ELECTROSURGICAL) ×1 IMPLANT
EXTRACTOR VACUUM KIWI (MISCELLANEOUS) IMPLANT
GLOVE BIO SURGEON STRL SZ 6.5 (GLOVE) ×2 IMPLANT
GLOVE BIO SURGEONS STRL SZ 6.5 (GLOVE) ×1
GOWN STRL REUS W/TWL LRG LVL3 (GOWN DISPOSABLE) ×6 IMPLANT
KIT ABG SYR 3ML LUER SLIP (SYRINGE) IMPLANT
NEEDLE HYPO 25X5/8 SAFETYGLIDE (NEEDLE) IMPLANT
NS IRRIG 1000ML POUR BTL (IV SOLUTION) ×3 IMPLANT
PACK C SECTION WH (CUSTOM PROCEDURE TRAY) ×3 IMPLANT
PAD OB MATERNITY 4.3X12.25 (PERSONAL CARE ITEMS) ×3 IMPLANT
RTRCTR C-SECT PINK 25CM LRG (MISCELLANEOUS) ×3 IMPLANT
STRIP CLOSURE SKIN 1/2X4 (GAUZE/BANDAGES/DRESSINGS) ×2 IMPLANT
SUT CHROMIC 1 CTX 36 (SUTURE) ×6 IMPLANT
SUT PLAIN 0 NONE (SUTURE) IMPLANT
SUT PLAIN 2 0 XLH (SUTURE) ×3 IMPLANT
SUT VIC AB 0 CT1 27 (SUTURE) ×4
SUT VIC AB 0 CT1 27XBRD ANBCTR (SUTURE) ×2 IMPLANT
SUT VIC AB 2-0 CT1 27 (SUTURE) ×2
SUT VIC AB 2-0 CT1 TAPERPNT 27 (SUTURE) ×1 IMPLANT
SUT VIC AB 3-0 CT1 27 (SUTURE)
SUT VIC AB 3-0 CT1 TAPERPNT 27 (SUTURE) IMPLANT
SUT VIC AB 3-0 SH 27 (SUTURE) ×2
SUT VIC AB 3-0 SH 27X BRD (SUTURE) ×1 IMPLANT
SUT VIC AB 4-0 KS 27 (SUTURE) ×3 IMPLANT
TOWEL OR 17X24 6PK STRL BLUE (TOWEL DISPOSABLE) ×3 IMPLANT
TRAY FOLEY CATH 14FR (SET/KITS/TRAYS/PACK) ×3 IMPLANT
WATER STERILE IRR 1000ML POUR (IV SOLUTION) ×3 IMPLANT

## 2013-05-18 NOTE — Progress Notes (Signed)
Hypoglycemic Event  CBG: 63  Treatment: 15 GM carbohydrate snack  Symptom slight jittery , checking cbg Q 4 hours til eating  Follow-up CBG: Time : 13:20 CBG Result:80  Possible Reasons for Event: Other: Postop GDM clear liquids to advance dietPost-op GDM clear liquids to advance diet  Comments/MD notified:Dr Senaida Oresichardson notified no further orders    Marissa HammockBetsy Ellen Cavan Carpenter  Remember to initiate Hypoglycemia Order Set & complete

## 2013-05-18 NOTE — Addendum Note (Signed)
Addendum created 05/18/13 1447 by Earmon PhoenixValerie P Isidor Bromell, CRNA   Modules edited: Notes Section   Notes Section:  File: [id number]

## 2013-05-18 NOTE — Anesthesia Postprocedure Evaluation (Signed)
  Anesthesia Post-op Note  Patient: Marissa Carpenter  Procedure(s) Performed: Procedure(s): REPEAT CESAREAN SECTION (N/A)  Patient Location: Mother/Baby  Anesthesia Type:Epidural  Level of Consciousness: awake, alert , oriented and patient cooperative  Airway and Oxygen Therapy: Patient Spontanous Breathing  Post-op Pain: mild  Post-op Assessment: Patient's Cardiovascular Status Stable, Respiratory Function Stable, No headache, No backache, No residual numbness and No residual motor weakness  Post-op Vital Signs: stable  Last Vitals:  Filed Vitals:   05/18/13 1406  BP: 107/68  Pulse: 96  Temp:   Resp:     Complications: No apparent anesthesia complications

## 2013-05-18 NOTE — Anesthesia Preprocedure Evaluation (Signed)
Anesthesia Evaluation  Patient identified by MRN, date of birth, ID band Patient awake    Reviewed: Allergy & Precautions, H&P , Patient's Chart, lab work & pertinent test results  Airway       Dental   Pulmonary Current Smoker,          Cardiovascular negative cardio ROS  + dysrhythmias     Neuro/Psych negative neurological ROS  negative psych ROS   GI/Hepatic GERD-  Medicated and Controlled,  Endo/Other  diabetes, Well Controlled, Gestational, Insulin DependentMorbid obesityMorbid obesity  Renal/GU   negative genitourinary   Musculoskeletal negative musculoskeletal ROS (+)   Abdominal   Peds  Hematology negative hematology ROS (+)   Anesthesia Other Findings   Reproductive/Obstetrics (+) Pregnancy                           Anesthesia Physical  Anesthesia Plan  ASA: III  Anesthesia Plan: Epidural   Post-op Pain Management:    Induction:   Airway Management Planned: Natural Airway  Additional Equipment:   Intra-op Plan:   Post-operative Plan:   Informed Consent: I have reviewed the patients History and Physical, chart, labs and discussed the procedure including the risks, benefits and alternatives for the proposed anesthesia with the patient or authorized representative who has indicated his/her understanding and acceptance.   Dental advisory given  Plan Discussed with: Anesthesiologist  Anesthesia Plan Comments:         Anesthesia Quick Evaluation

## 2013-05-18 NOTE — Progress Notes (Signed)
Patient ID: Marissa Carpenter, female   DOB: 07/27/1985, 28 y.o.   MRN: [id number] Per pt no changes in dictated H&P, Brief exam WNL.  BS this AM 87.  Ready to proceed.

## 2013-05-18 NOTE — Anesthesia Postprocedure Evaluation (Signed)
  Anesthesia Post-op Note  Patient: Marissa Carpenter  Procedure(s) Performed: Procedure(s): REPEAT CESAREAN SECTION (N/A)  Patient Location: PACU  Anesthesia Type:Spinal  Level of Consciousness: awake, alert  and oriented  Airway and Oxygen Therapy: Patient Spontanous Breathing  Post-op Pain: none  Post-op Assessment: Post-op Vital signs reviewed, Patient's Cardiovascular Status Stable, Respiratory Function Stable, Patent Airway, No signs of Nausea or vomiting, Pain level controlled, No headache and No backache  Post-op Vital Signs: Reviewed and stable  Last Vitals:  Filed Vitals:   05/18/13 1000  BP: 112/53  Pulse: 84  Temp:   Resp: 24    Complications: No apparent anesthesia complications

## 2013-05-18 NOTE — Transfer of Care (Signed)
Immediate Anesthesia Transfer of Care Note  Patient: Marissa Carpenter  Procedure(s) Performed: Procedure(s): REPEAT CESAREAN SECTION (N/A)  Patient Location: PACU  Anesthesia Type:Spinal  Level of Consciousness: awake, alert , oriented and patient cooperative  Airway & Oxygen Therapy: Patient Spontanous Breathing  Post-op Assessment: Report given to PACU RN and Post -op Vital signs reviewed and stable  Post vital signs: Reviewed and stable  Complications: No apparent anesthesia complications

## 2013-05-18 NOTE — Anesthesia Procedure Notes (Signed)
Spinal  Patient location during procedure: OR Start time: 05/18/2013 7:31 AM Staffing Anesthesiologist: Raynelle Fujikawa A. Performed by: anesthesiologist  Preanesthetic Checklist Completed: patient identified, site marked, surgical consent, pre-op evaluation, timeout performed, IV checked, risks and benefits discussed and monitors and equipment checked Spinal Block Patient position: sitting Prep: site prepped and draped and DuraPrep Patient monitoring: heart rate, cardiac monitor, continuous pulse ox and blood pressure Approach: midline Location: L3-4 Injection technique: single-shot Needle Needle type: Sprotte  Needle gauge: 24 G Needle length: 9 cm Needle insertion depth: 6 cm Assessment Sensory level: T4 Additional Notes Patient tolerated procedure well. Adequate sensory level.

## 2013-05-18 NOTE — Op Note (Signed)
Operative Note  Preoperative Diagnosis Term Pregnancy at [redacted] weeks Gestational Diabetes Prior c-section, declines TOL  Postoperative Diagnosis Same  Procedure Repeat Low Transverse C-section with 2 layer closure of Uterus  Surgeon Dr. Huel CoteKathy Michelina Mexicano Dr. Tawanna Coolerodd Meisinger  Anesthesia Spinal  Fluids EBL 700cc UOP 150cc clear IVF 2200 cc LR  Findings There was a viable female infant in the vertex presentation.  Apgars are 8,9  Weight pending  Normal uterus ovaries and tubes  Specimen Placenta sent to L&D  Procedure note  Patient was taken to the operating room where epidural anesthesia was found to be adequate by Allis clamp test. She was prepped and draped in the normal sterile fashion in the dorsal supine position with a leftward tilt. An appropriate time out was performed. A Pfannenstiel skin incision was then made with the scalpel through a preexisting scar and carried through to the underlying layer of fascia by sharp dissection and Bovie cautery. The fascia was nicked in the midline and the incision was extended laterally with Mayo scissors.. The superior aspect was dissected off the rectus muscles. Rectus muscles were separated in the midline and the peritoneal cavity entered sharply. The peritoneal incision was then extended both superiorly and inferiorly with careful attention to avoid both bowel and bladder. The Alexis self-retaining wound retractor was then placed within the incision and the lower uterine segment exposed. The bladder flap was developed with Metzenbaum scissors and pushed away from the lower uterine segment. The lower uterine segment was then incised in a transverse fashion and the cavity itself entered bluntly. The incision was extended bluntly. The infant's head was then lifted and delivered from the incision without difficulty. The remainder of the infant delivered and the nose and mouth bulb suctioned with the cord clamped and cut as well. The infant was handed  off to the waiting pediatricians. The placenta was then spontaneously expressed from the uterus and the uterus cleared of all clots and debris with moist lap sponge. The uterine incision was then repaired in 2 layers the first layer was a running locked layer 1-0 chromic and the second an imbricating layer of the same suture. The tubes and ovaries were inspected and the gutters cleared of all clots and debris. The uterine incision was inspected and found to be hemostatic. All instruments and sponges as well as the Alexis retractor were then removed from the abdomen. The rectus muscles and peritoneum were then reapproximated with several interrupted mattress sutures of 2-0 Vicryl.One vessel was bleeding at the upper aspect of the left rectus muscles and was controlled with a figure-of eight suture of 3-0 vicryl.  The fascia was then closed with 0 Vicryl in a running fashion. Subcutaneous tissue was reapproximated with 3-0 plain in a running fashion. The skin was closed with a subcuticular stitch of 4-0 Vicryl on a Keith needle and then reinforced with benzoin and Steri-Strips. At the conclusion of the procedure all instruments and sponge counts were correct. Patient was taken to the recovery room in good condition with her baby accompanying her skin to skin.   The parents were consented for circumcision with a translator present.

## 2013-05-19 LAB — CBC
HCT: 33.5 % — ABNORMAL LOW (ref 36.0–46.0)
HEMOGLOBIN: 10.8 g/dL — AB (ref 12.0–15.0)
MCH: 27.1 pg (ref 26.0–34.0)
MCHC: 32.2 g/dL (ref 30.0–36.0)
MCV: 84 fL (ref 78.0–100.0)
Platelets: 200 10*3/uL (ref 150–400)
RBC: 3.99 MIL/uL (ref 3.87–5.11)
RDW: 17.4 % — ABNORMAL HIGH (ref 11.5–15.5)
WBC: 16.8 10*3/uL — AB (ref 4.0–10.5)

## 2013-05-19 LAB — BIRTH TISSUE RECOVERY COLLECTION (PLACENTA DONATION)

## 2013-05-19 LAB — GLUCOSE, CAPILLARY: Glucose-Capillary: 94 mg/dL (ref 70–99)

## 2013-05-19 NOTE — Lactation Note (Signed)
This note was copied from the chart of Marissa Nixon Tackitt. Lactation Consultation Note Offered to call interpreter and mother refused.  FOB interpreted. Family called for LC to view latch.   Mother put baby in cradle position.  Baby latched easily. Sucks and swallows observed. Encouraged mother to use support pillows but mother comfortable. Repositioned baby turning him towards mother.  Patient Name: Marissa Leticia PennaRahil Ottaway UJWJX'BToday's Date: 05/19/2013 Reason for consult: Follow-up assessment   Maternal Data Has patient been taught Hand Expression?: Yes  Feeding Feeding Type: Breast Fed Length of feed: 15 min  LATCH Score/Interventions Latch: Grasps breast easily, tongue down, lips flanged, rhythmical sucking. Intervention(s): Adjust position  Audible Swallowing: Spontaneous and intermittent  Type of Nipple: Everted at rest and after stimulation  Comfort (Breast/Nipple): Soft / non-tender     Hold (Positioning): Assistance needed to correctly position infant at breast and maintain latch.  LATCH Score: 9  Lactation Tools Discussed/Used     Consult Status Consult Status: Follow-up Date: 05/20/13 Follow-up type: In-patient    Dulce SellarRuth Boschen Berkelhammer 05/19/2013, 3:33 PM

## 2013-05-19 NOTE — Progress Notes (Signed)
Subjective: Postpartum Day 1: Cesarean Delivery Patient reports tolerating PO and no problems voiding.    Objective: Vital signs in last 24 hours: Temp:  [97 F (36.1 C)-99.4 F (37.4 C)] 98.2 F (36.8 C) (04/25 0934) Pulse Rate:  [61-115] 81 (04/25 0934) Resp:  [16-23] 18 (04/25 0934) BP: (99-157)/(45-117) 101/61 mmHg (04/25 0934) SpO2:  [93 %-98 %] 97 % (04/25 0625) Weight:  [106.142 kg (234 lb)] 106.142 kg (234 lb) (04/24 1100)  Physical Exam:  General: alert and cooperative Lochia: appropriate Uterine Fundus: firm Incision: C/D/I    Recent Labs  05/17/13 1030 05/19/13 0600  HGB 12.7 10.8*  HCT 38.3 33.5*    Assessment/Plan: Status post Cesarean section. Doing well postoperatively.  Continue current care. All BS WNL, will d/c accuchecks  Oliver PilaKathy W Hattie Pine 05/19/2013, 10:12 AM

## 2013-05-20 LAB — TYPE AND SCREEN
ABO/RH(D): A POS
ANTIBODY SCREEN: NEGATIVE
UNIT DIVISION: 0
Unit division: 0

## 2013-05-20 MED ORDER — OXYCODONE-ACETAMINOPHEN 5-325 MG PO TABS: 1.0000 | ORAL_TABLET | ORAL | Status: DC | PRN

## 2013-05-20 MED ORDER — IBUPROFEN 600 MG PO TABS: 600.0000 mg | ORAL_TABLET | Freq: Four times a day (QID) | ORAL | Status: DC

## 2013-05-20 NOTE — Discharge Summary (Signed)
Obstetric Discharge Summary Reason for Admission: cesarean section Prenatal Procedures: NST Intrapartum Procedures: cesarean: low cervical, transverse Postpartum Procedures: none Complications-Operative and Postpartum: none Hemoglobin  Date Value Ref Range Status  05/19/2013 10.8* 12.0 - 15.0 g/dL Final     HCT  Date Value Ref Range Status  05/19/2013 33.5* 36.0 - 46.0 % Final    Physical Exam:  General: alert and cooperative Lochia: appropriate Uterine Fundus: firm Incision: C/D/I   Discharge Diagnoses: Term Pregnancy-delivered                                         Gestational Diabetes                                         Prior c-section  Discharge Information: Date: 05/20/2013 Activity: pelvic rest Diet: routine Medications: Ibuprofen and Percocet Condition: improved Instructions: refer to practice specific booklet Discharge to: home Follow-up Information   Follow up with Oliver PilaICHARDSON,Jamion Carter W, MD In 2 weeks. (incision check)    Specialty:  Obstetrics and Gynecology   Contact information:   [address], [address] [place] [address] [phone]       Follow up with Oliver PilaICHARDSON,Shyloh Krinke W, MD. Schedule an appointment as soon as possible for a visit in 2 weeks. (incision check)    Specialty:  Obstetrics and Gynecology   Contact information:   [address], [address] [place] [address] [phone]       Newborn Data: Live born female  Birth Weight: 9 lb 7.9 oz (4305 g) APGAR: 9, 9  Home with mother.  Oliver PilaKathy W Shiquita Collignon 05/20/2013, 9:56 AM

## 2013-05-20 NOTE — Progress Notes (Signed)
Subjective: Postpartum Day 2 Cesarean Delivery Patient reports incisional pain, tolerating PO and no problems voiding.    Objective: Vital signs in last 24 hours: Temp:  [98.3 F (36.8 C)-98.6 F (37 C)] 98.6 F (37 C) (04/26 0501) Pulse Rate:  [93] 93 (04/26 0501) Resp:  [18-20] 18 (04/26 0501) BP: (107-112)/(58-59) 107/58 mmHg (04/26 0501) SpO2:  [96 %] 96 % (04/26 0501)  Physical Exam:  General: alert and cooperative Lochia: appropriate Uterine Fundus: firm Incision: C/D/I    Recent Labs  05/17/13 1030 05/19/13 0600  HGB 12.7 10.8*  HCT 38.3 33.5*    Assessment/Plan: Status post Cesarean section. Doing well postoperatively.  Discharge home with standard precautions and return to office in 2 weeks for incision check.  Oliver PilaKathy W Taiden Raybourn 05/20/2013, 9:58 AM

## 2013-05-21 ENCOUNTER — Encounter (HOSPITAL_COMMUNITY): Payer: Self-pay | Admitting: Obstetrics and Gynecology

## 2013-05-21 NOTE — Progress Notes (Signed)
Ur chart review completed post discharge.  

## 2013-07-02 ENCOUNTER — Encounter: Payer: Self-pay | Admitting: Family Medicine

## 2013-07-02 ENCOUNTER — Ambulatory Visit (INDEPENDENT_AMBULATORY_CARE_PROVIDER_SITE_OTHER): Payer: Medicaid Other | Admitting: Family Medicine

## 2013-07-02 VITALS — BP 104/58 | HR 62 | Temp 98.7°F | Ht 63.0 in | Wt 203.0 lb

## 2013-07-02 DIAGNOSIS — M25539 Pain in unspecified wrist: Secondary | ICD-10-CM

## 2013-07-02 DIAGNOSIS — M25532 Pain in left wrist: Secondary | ICD-10-CM

## 2013-07-02 MED ORDER — MELOXICAM 15 MG PO TABS: 15.0000 mg | ORAL_TABLET | Freq: Every day | ORAL | Status: DC

## 2013-07-02 MED ORDER — WRIST SPLINT/ELASTIC RIGHT MED MISC: 1.0000 | Freq: Every day | Status: DC

## 2013-07-02 NOTE — Patient Instructions (Signed)
Thank you for coming in today.  I suspect DeQuervains tenosynovitis. Please do the following.  Ice for 20 minutes 2-3 tiems daily mobic once daily with food Splint. Thumb spica.  Go for x-ray   F/u with Dr. Benjamin Stain in 2-3 weeks for possible injection of steroid.  Dr. Brooke Pace Quervain's Disease Suzette Battiest disease is a condition often seen in racquet sports where there is a soreness (inflammation) in the cord like structures (tendons) which attach muscle to bone on the thumb side of the wrist. There may be a tightening of the tissuesaround the tendons. This condition is often helped by giving up or modifying the activity which caused it. When conservative treatment does not help, surgery may be required. Conservative treatment could include changes in the activity which brought about the problem or made it worse. Anti-inflammatory medications and injections may be used to help decrease the inflammation and help with pain control. Your caregiver will help you determine which is best for you. DIAGNOSIS  Often the diagnosis (learning what is wrong) can be made by examination. Sometimes x-rays are required. HOME CARE INSTRUCTIONS   Apply ice to the sore area for 15-20 minutes, 03-04 times per day while awake. Put the ice in a plastic bag and place a towel between the bag of ice and your skin. This is especially helpful if it can be done after all activities involving the sore wrist.  Temporary splinting may help.  Only take over-the-counter or prescription medicines for pain, discomfort or fever as directed by your caregiver. SEEK MEDICAL CARE IF:   Pain relief is not obtained with medications, or if you have increasing pain and seem to be getting worse rather than better. MAKE SURE YOU:   Understand these instructions.  Will watch your condition.  Will get help right away if you are not doing well or get worse. Document Released: 10/06/2000 Document Revised: 04/05/2011  Document Reviewed: 01/11/2005 [facility] Patient Information 2014 [place], [place].

## 2013-07-02 NOTE — Assessment & Plan Note (Signed)
A: chronic R radial wrist pain x one year. Originally thought to be painful ganglion cyst. No cyst on exam today. Most concerns for DeQuervains tenosynovitis. Patient has iced and used ibuprofen w/o relief. X-ray postponed due to pregnancy. She is now 45 days post partum.Marland Kitchen  P: DG R wrist Ice  mobic immobilization F/u with PCP for steroid injection if the above fails in 2-3 weeks.

## 2013-07-03 NOTE — Progress Notes (Signed)
   Subjective:    Patient ID: Marissa Carpenter, female    DOB: 06/22/1985, 28 y.o.   MRN: [id number] CC: R wrist pain  HPI 28 yo F with R wrist pain x one year. No injury. Ice and OTC NSAID does not help. No swelling. Radial side of wrist. Worse with ulnar deviation.   Med hx: 28 days post partum and breast feeding Soc hx: non smoker  Review of Systems As per HPI     Objective:   Physical Exam BP 104/58  Pulse 62  Temp(Src) 98.7 F (37.1 C) (Oral)  Ht 5\' 3"  (1.6 m)  Wt 203 lb (92.08 kg)  BMI 35.97 kg/m2 General appearance: alert, cooperative and no distress R wrist: mild TTP radial wrist, normal finger strength. Normal ROM. + finkelstein test. Normal radial pulse. No skin changes.        Assessment & Plan:

## 2013-07-05 ENCOUNTER — Ambulatory Visit
Admission: RE | Admit: 2013-07-05 | Discharge: 2013-07-05 | Disposition: A | Discharge status: Home / Self Care | Payer: Medicaid Other | Source: Ambulatory Visit | Attending: Family Medicine | Admitting: Family Medicine

## 2013-07-05 ENCOUNTER — Telehealth: Payer: Self-pay | Admitting: *Deleted

## 2013-07-05 DIAGNOSIS — M25532 Pain in left wrist: Secondary | ICD-10-CM

## 2013-07-05 NOTE — Telephone Encounter (Signed)
Message copied by Farrell Ours on Thu Jul 05, 2013  4:04 PM ------      Message from: Dessa Phi      Created: Thu Jul 05, 2013  1:37 PM       [place] Dec, please inform patient.      Normal wrist x-ray.      Results routed to PCP as well to close the loop. ------

## 2013-07-05 NOTE — Telephone Encounter (Signed)
Attempted to call patient no answer

## 2013-07-20 ENCOUNTER — Ambulatory Visit: Payer: Medicaid Other | Admitting: Family Medicine

## 2013-08-08 ENCOUNTER — Telehealth: Payer: Self-pay | Admitting: Family Medicine

## 2013-08-08 ENCOUNTER — Encounter: Payer: Self-pay | Admitting: Family Medicine

## 2013-08-08 ENCOUNTER — Ambulatory Visit (INDEPENDENT_AMBULATORY_CARE_PROVIDER_SITE_OTHER): Payer: Self-pay | Admitting: Family Medicine

## 2013-08-08 VITALS — BP 107/73 | HR 72 | Temp 98.3°F | Wt 202.0 lb

## 2013-08-08 DIAGNOSIS — M25531 Pain in right wrist: Secondary | ICD-10-CM

## 2013-08-08 DIAGNOSIS — O99810 Abnormal glucose complicating pregnancy: Secondary | ICD-10-CM

## 2013-08-08 DIAGNOSIS — O24414 Gestational diabetes mellitus in pregnancy, insulin controlled: Secondary | ICD-10-CM

## 2013-08-08 DIAGNOSIS — O24419 Gestational diabetes mellitus in pregnancy, unspecified control: Secondary | ICD-10-CM | POA: Insufficient documentation

## 2013-08-08 DIAGNOSIS — L83 Acanthosis nigricans: Secondary | ICD-10-CM | POA: Insufficient documentation

## 2013-08-08 DIAGNOSIS — M25539 Pain in unspecified wrist: Secondary | ICD-10-CM

## 2013-08-08 LAB — POCT GLYCOSYLATED HEMOGLOBIN (HGB A1C): HEMOGLOBIN A1C: 5.6

## 2013-08-08 NOTE — Assessment & Plan Note (Signed)
De Quervain's tenosynovitis by exam and history. Patient has not been wearing thumb spica splint as prescribed. [facility]. She is hesitant about injection due to risk of hypopigmentation. -Discussed risks and benefits, and patient would like to not injection today but to consider for the future. -Encouraged patient to wear a thumb spica splint daily. -Patient can take ibuprofen 200-400 mg 1 to 3 times daily as needed for pain. - Reviewed natural history of this and that it can also resolve on its own with no injection. -Asked patient to return if she decides she wants an injection or for reevaluation if pain or symptoms worsen.

## 2013-08-08 NOTE — Assessment & Plan Note (Signed)
Per my exam this looks like a acanthosis nigricans.Concern for diabetes or prediabetes in obese patient with gestational diabetes in the past. - A1c today and will call patient to discuss results. - Discussed the patient can begin incorporating exercise into her life and paying attention to her carbohydrate intake. Reviewed that she should have no more than 2 carbohydrate servings per meal.

## 2013-08-08 NOTE — Patient Instructions (Addendum)
De Quervain's Disease Suzette BattiestDe Quervain's disease is a condition often seen in racquet sports where there is a soreness (inflammation) in the cord like structures (tendons) which attach muscle to bone on the thumb side of the wrist. There may be a tightening of the tissuesaround the tendons. This condition is often helped by giving up or modifying the activity which caused it. When conservative treatment does not help, surgery may be required. Conservative treatment could include changes in the activity which brought about the problem or made it worse. Anti-inflammatory medications and injections may be used to help decrease the inflammation and help with pain control. Your caregiver will help you determine which is best for you. DIAGNOSIS  Often the diagnosis (learning what is wrong) can be made by examination. Sometimes x-rays are required. HOME CARE INSTRUCTIONS   Apply ice to the sore area for 15-20 minutes, 03-04 times per day while awake. Put the ice in a plastic bag and place a towel between the bag of ice and your skin. This is especially helpful if it can be done after all activities involving the sore wrist.  Temporary splinting may help.  Only take over-the-counter or prescription medicines for pain, discomfort or fever as directed by your caregiver. SEEK MEDICAL CARE IF:   Pain relief is not obtained with medications, or if you have increasing pain and seem to be getting worse rather than better. MAKE SURE YOU:   Understand these instructions.  Will watch your condition.  Will get help right away if you are not doing well or get worse. Document Released: 10/06/2000 Document Revised: 04/05/2011 Document Reviewed: 01/11/2005 [facility] Patient Information 2015 [place], [place]. This information is not intended to replace advice given to you by your health care provider. Make sure you discuss any questions you have with your health care provider.  Be sure to wear your brace. You can take  ibuprofen 2-400mg  1-3 times daily as needed for pain. Return if you decide you want an injection, or if gets red or swollen.  You are getting a lab today for blood sugar testing and I will call if this is not normal. Work on exercise and eating no more than 2 servings of carbohydrates per meal.

## 2013-08-08 NOTE — Progress Notes (Signed)
Patient ID: Marissa Carpenter, female   DOB: 08/13/1985, 28 y.o.   MRN: [id number] Subjective:   CC: Followup right hand pain, discuss skin darkening  HPI:   Followup right hand pain Patient was seen by Dr. Mordecai MaesSanchez in the office and diagnosed with de Quervain's tenosynovitis a little over one month ago. The pain has not improved at all. It is a 7 or 8/10 and sometimes decreases. She has not had any erythema or swelling. She only wears her spica splint occasionally. She is able to squeeze and grab objects but it hurts especially after using her hands all day. Pain is on the lateral side of her right radial wrist and extends up the dorsal side of her thumb. Pain has been present for about 3 months and started suddenly. She had a wrist x-ray which was normal. This occurred last year as well but improved on its own. [facility] tablet daily for one week which helped.  Skin darkening Patient describes that her skin around her neck in her underarms has gotten darker over the past few months. This happened once before in a prior pregnancy and again with this pregnancy. She reports having gestational diabetes this pregnancy but was told that she no longer had it by the end of the pregnancy. She does not report any other symptoms including polyuria, polydipsia, chest pain, or shortness of breath.   Review of Systems - Per HPI.   PMH: Gestational diabetes during most recent pregnancy    Objective:  Physical Exam BP 107/73  Pulse 72  Temp(Src) 98.3 F (36.8 C) (Oral)  Wt 202 lb (91.627 kg)  Breastfeeding? Yes GEN: NAD, seated on exam table Extremities: Right wrist: Tender over radial styloid Normal thumb opposition Positive Finkelstein's test Reproduced pain with wrist extension Skin: Mild hyperpigmentation around the neck and moderate hyperpigmentation in underarms bilaterally    Assessment:     Marissa Carpenter is a 28 y.o. female with h/o gestational diabetes with prior pregnancy here for f/u  right hand pain and discussion of skin darkening.    Plan:   Followup right hand pain De Quervain's tenosynovitis by exam and history. Patient has not been wearing thumb spica splint as prescribed. She did [facility] which helped. She is hesitant about injection due to risk of hypopigmentation. -Discussed risks and benefits, and patient would like to not injection today but to consider for the future. -Encouraged patient to wear a thumb spica splint daily. -Patient can take ibuprofen 200-400 mg 1 to 3 times daily as needed for pain. - Reviewed natural history of this and that it can also resolve on its own with no injection. -Asked patient to return if she decides she wants an injection or for reevaluation if pain or symptoms worsen.  Skin darkening Per my exam this looks like a acanthosis nigricans.Concern for diabetes or prediabetes in obese patient with gestational diabetes in the past. - A1c today and will call patient to discuss results. - Discussed the patient can begin incorporating exercise into her life and paying attention to her carbohydrate intake. Reviewed that she should have no more than 2 carbohydrate servings per meal.  Follow up as needed if symptoms of thumb/wrist pain do not improve or worsen.  Marissa SingletonMaria T Royal Vandevoort, MD [facility]

## 2013-08-08 NOTE — Telephone Encounter (Addendum)
Called and spoke with husband who translates for patient. Her A1c is on upper end of normal at 5.6. Recommended watching carbohydrate intake and exercising. Asked them to make appt with me if she would like to discuss further in clinic. Husband voiced understanding.  Leona SingletonMaria T Delene Morais, MD

## 2013-11-26 ENCOUNTER — Encounter: Payer: Self-pay | Admitting: Family Medicine

## 2014-09-03 ENCOUNTER — Ambulatory Visit: Payer: Self-pay

## 2014-09-27 ENCOUNTER — Ambulatory Visit (INDEPENDENT_AMBULATORY_CARE_PROVIDER_SITE_OTHER): Payer: Self-pay | Admitting: Family Medicine

## 2014-09-27 ENCOUNTER — Encounter: Payer: Self-pay | Admitting: Family Medicine

## 2014-09-27 ENCOUNTER — Telehealth: Payer: Self-pay | Admitting: Obstetrics and Gynecology

## 2014-09-27 VITALS — BP 115/71 | HR 77 | Temp 98.3°F | Ht 63.0 in | Wt 216.0 lb

## 2014-09-27 DIAGNOSIS — M940 Chondrocostal junction syndrome [Tietze]: Secondary | ICD-10-CM

## 2014-09-27 MED ORDER — MELOXICAM 15 MG PO TABS: 15.0000 mg | ORAL_TABLET | Freq: Every day | ORAL | Status: DC

## 2014-09-27 NOTE — Progress Notes (Signed)
Patient ID: Marissa Carpenter, female   DOB: 04/22/1985, 29 y.o.   MRN: [id number]  Arabic interpreter utilized during this encounter.  HPI:  Pain under L breast - present x 1 month. Worse with stress or nervous. Tried tylenol without relief. No fevers. Occasional cough. Lasts 30-60 minutes at a time. Only occurs at rest, not with exertion. Does feel nauseated and sweaty with the pain. Grandfather with hx of tachycardia, otherwise no family hx of cardiac issues. Happening once daily, every day.  No vomiting. No breast masses or nipple discharge.  ROS: See HPI.  PMFSH: hx acanthosis, GERD, HLD  PHYSICAL EXAM: BP 115/71 mmHg  Pulse 77  Temp(Src) 98.3 F (36.8 C) (Oral)  Ht  (1.6 m)  Wt 216 lb (97.977 kg)  BMI 38.27 kg/m2 Gen: NAD, pleasant, cooperative HEENT: NCAT Heart: RRR no murmur, no rubs Lungs: CTAB NWOB Breast: L breast without any palpable masses or abnormalities. No axillary LAD. No nipple discharge. Breast itself is nontender to palpation, but deeper palpation of chest wall in lower inner quadrant of breast reproduces pain patient is reporting. Neuro: grossly nonfocal, gait normal, follows commands  ASSESSMENT/PLAN:  Costochondritis Sx's & exam consistent with costochondritis, clearly reproducible with palpation. Trial of mobic. Ice. F/u with PCP in 2 weeks.   FOLLOW UP: F/u in 2 weeks with PCP for costochondritis  [place] J. Pollie Meyer, MD [facility]

## 2014-09-27 NOTE — Telephone Encounter (Signed)
Please give patient Dr. Gerilyn Pilgrim number to call and see if she is able to schedule her into one of her clinics (Dr. Gerilyn Pilgrim make her schedules). Usually patient's have to contact her directly.It also appears that patient has no insurance so referral is not possible. Hopefully she can get into a Nutrition clinic with a Dr. Gerilyn Pilgrim and a resident.

## 2014-09-27 NOTE — Patient Instructions (Signed)
Take meloxicam every day for 1 week Then can take daily as needed No ibuprofen, advil, aleve, etc while on this medicine  Follow up with your primary doctor in 2 weeks for this, sooner if getting worse.  Be well, Dr. Pollie Meyer   Costochondritis Costochondritis, sometimes called Tietze syndrome, is a swelling and irritation (inflammation) of the tissue (cartilage) that connects your ribs with your breastbone (sternum). It causes pain in the chest and rib area. Costochondritis usually goes away on its own over time. It can take up to 6 weeks or longer to get better, especially if you are unable to limit your activities. CAUSES  Some cases of costochondritis have no known cause. Possible causes include:  Injury (trauma).  Exercise or activity such as lifting.  Severe coughing. SIGNS AND SYMPTOMS  Pain and tenderness in the chest and rib area.  Pain that gets worse when coughing or taking deep breaths.  Pain that gets worse with specific movements. DIAGNOSIS  Your health care provider will do a physical exam and ask about your symptoms. Chest X-rays or other tests may be done to rule out other problems. TREATMENT  Costochondritis usually goes away on its own over time. Your health care provider may prescribe medicine to help relieve pain. HOME CARE INSTRUCTIONS   Avoid exhausting physical activity. Try not to strain your ribs during normal activity. This would include any activities using chest, abdominal, and side muscles, especially if heavy weights are used.  Apply ice to the affected area for the first 2 days after the pain begins.  Put ice in a plastic bag.  Place a towel between your skin and the bag.  Leave the ice on for 20 minutes, 2-3 times a day.  Only take over-the-counter or prescription medicines as directed by your health care provider. SEEK MEDICAL CARE IF:  You have redness or swelling at the rib joints. These are signs of infection.  Your pain does not go  away despite rest or medicine. SEEK IMMEDIATE MEDICAL CARE IF:   Your pain increases or you are very uncomfortable.  You have shortness of breath or difficulty breathing.  You cough up blood.  You have worse chest pains, sweating, or vomiting.  You have a fever or persistent symptoms for more than 2-3 days.  You have a fever and your symptoms suddenly get worse. MAKE SURE YOU:   Understand these instructions.  Will watch your condition.  Will get help right away if you are not doing well or get worse. Document Released: 10/21/2004 Document Revised: 11/01/2012 Document Reviewed: 08/15/2012 [facility] Patient Information 2015 [place], [place]. This information is not intended to replace advice given to you by your health care provider. Make sure you discuss any questions you have with your health care provider.

## 2014-09-27 NOTE — Assessment & Plan Note (Signed)
Sx's & exam consistent with costochondritis, clearly reproducible with palpation. Trial of mobic. Ice. F/u with PCP in 2 weeks.

## 2014-09-27 NOTE — Telephone Encounter (Signed)
Patient requests PCP referral for Dr. Gerilyn Pilgrim. Please, follow up.

## 2014-09-27 NOTE — Telephone Encounter (Signed)
Will forward to PCP for review. Harlym Gehling, CMA. 

## 2014-10-04 NOTE — Telephone Encounter (Signed)
Pt informed of Dr. Gerilyn Pilgrim number and was told to call to schedule. Marissa Carpenter, Shanda Bumps Dawn]

## 2014-10-11 ENCOUNTER — Encounter: Payer: Self-pay | Admitting: Family Medicine

## 2014-10-11 ENCOUNTER — Ambulatory Visit (INDEPENDENT_AMBULATORY_CARE_PROVIDER_SITE_OTHER): Payer: Self-pay | Admitting: Family Medicine

## 2014-10-11 VITALS — Wt 214.0 lb

## 2014-10-11 DIAGNOSIS — M940 Chondrocostal junction syndrome [Tietze]: Secondary | ICD-10-CM

## 2014-10-11 MED ORDER — DICLOFENAC SODIUM 75 MG PO TBEC: 75.0000 mg | DELAYED_RELEASE_TABLET | Freq: Two times a day (BID) | ORAL | Status: DC

## 2014-10-11 NOTE — Patient Instructions (Signed)
Costochondritis Costochondritis, sometimes called Tietze syndrome, is a swelling and irritation (inflammation) of the tissue (cartilage) that connects your ribs with your breastbone (sternum). It causes pain in the chest and rib area. Costochondritis usually goes away on its own over time. It can take up to 6 weeks or longer to get better, especially if you are unable to limit your activities. CAUSES  Some cases of costochondritis have no known cause. Possible causes include:  Injury (trauma).  Exercise or activity such as lifting.  Severe coughing. SIGNS AND SYMPTOMS  Pain and tenderness in the chest and rib area.  Pain that gets worse when coughing or taking deep breaths.  Pain that gets worse with specific movements. DIAGNOSIS  Your health care provider will do a physical exam and ask about your symptoms. Chest X-rays or other tests may be done to rule out other problems. TREATMENT  Costochondritis usually goes away on its own over time. Your health care provider may prescribe medicine to help relieve pain. HOME CARE INSTRUCTIONS   Avoid exhausting physical activity. Try not to strain your ribs during normal activity. This would include any activities using chest, abdominal, and side muscles, especially if heavy weights are used.  Apply ice to the affected area for the first 2 days after the pain begins.  Put ice in a plastic bag.  Place a towel between your skin and the bag.  Leave the ice on for 20 minutes, 2-3 times a day.  Only take over-the-counter or prescription medicines as directed by your health care provider. SEEK MEDICAL CARE IF:  You have redness or swelling at the rib joints. These are signs of infection.  Your pain does not go away despite rest or medicine. SEEK IMMEDIATE MEDICAL CARE IF:   Your pain increases or you are very uncomfortable.  You have shortness of breath or difficulty breathing.  You cough up blood.  You have worse chest pains,  sweating, or vomiting.  You have a fever or persistent symptoms for more than 2-3 days.  You have a fever and your symptoms suddenly get worse. MAKE SURE YOU:   Understand these instructions.  Will watch your condition.  Will get help right away if you are not doing well or get worse. Document Released: 10/21/2004 Document Revised: 11/01/2012 Document Reviewed: 08/15/2012 [employer] Patient Information 2015 [employer]. This information is not intended to replace advice given to you by your health care provider. Make sure you discuss any questions you have with your health care provider.  

## 2014-10-11 NOTE — Progress Notes (Signed)
   Subjective:    Patient ID: Marissa Carpenter, female    DOB: 02/22/1985, 29 y.o.   MRN: [id number]  HPI Left chest wall pain: Patient was seen on 9/2 and diagnosed with costochondritis. Rates pain as 5/6. Pain located under left breast. Took mobic for 7 days then took as needed.  Reports worst pain is 5/10, pain will decrease throughout the day and there are times she is pain free for 2-4 hours.  Reports pain also in the left arm pit. Worse with emotional stress.  Unable to list alleviating.    Review of Systems  Constitutional: Negative for fever and chills.  HENT: Positive for congestion (reports "pressure in nose", longer than 2 weeks. ) and sore throat (Reports she feels like something is holding her throat., longer than 2 weeks).   Eyes: Negative for pain and visual disturbance.  Respiratory: Negative for cough, chest tightness and shortness of breath.   Cardiovascular: Negative for chest pain.  Gastrointestinal: Positive for nausea. Negative for vomiting, abdominal pain and diarrhea.  Endocrine: Negative for cold intolerance and heat intolerance.  Genitourinary: Positive for dysuria (happens often, had this for 2-3 weeks). Negative for flank pain.  Musculoskeletal: Negative for back pain.  Skin: Negative for rash.  Allergic/Immunologic: Negative for food allergies.  Neurological: Negative for dizziness and light-headedness.  Psychiatric/Behavioral: Negative for agitation.       Objective:   Physical Exam  Constitutional: She is oriented to person, place, and time. She appears well-developed and well-nourished.  HENT:  Head: Normocephalic and atraumatic.  Eyes: Conjunctivae are normal.  Neck: Normal range of motion. Neck supple.  Cardiovascular: Normal rate and intact distal pulses.   Pulmonary/Chest: Effort normal.  Abdominal: Soft. There is no tenderness.  Musculoskeletal: Normal range of motion.  Neurological: She is alert and oriented to person, place, and time.  Skin: Skin  is warm and dry. No erythema.  Psychiatric: She has a normal mood and affect.  Nursing note and vitals reviewed.  Wt 214 lb (97.07 kg)  LMP 09/17/2014 (Approximate)  Breastfeeding? No     Assessment & Plan:  Marissa Carpenter is a 29 y.o. presenting for continued Left chest wall pain  #Costochrondritis - Will d/c mobic and start diclofenac x2 weeks - Recommended ice - f/u in 4 weeks  #Dysuria- longstanding does not seem infectious. Monitor and consider UA next visit.   >50% of this 25 min visit was spent in direct counseling regarding management of pain, discussion of treatment modalities, prevention and pathopysiology

## 2014-11-04 ENCOUNTER — Encounter: Payer: Self-pay | Admitting: Family Medicine

## 2014-11-04 ENCOUNTER — Ambulatory Visit (INDEPENDENT_AMBULATORY_CARE_PROVIDER_SITE_OTHER): Payer: Self-pay | Admitting: Family Medicine

## 2014-11-04 VITALS — BP 116/60 | HR 82 | Temp 98.3°F | Ht 63.0 in | Wt 218.4 lb

## 2014-11-04 DIAGNOSIS — M79671 Pain in right foot: Secondary | ICD-10-CM

## 2014-11-04 NOTE — Patient Instructions (Addendum)
Thank you for coming in,   I will call you with the results from the x-ray.   You can take ibuprofen for your pain.   I placed a referral to sports medicine so they can make some insoles to help with your feet.   Please follow up with [place] if your pain gets worse or changes in some way.   Sign up for My Chart to have easy access to your labs results, and communication with your Primary care physician   Please feel free to call with any questions or concerns at any time, at [phone]. --Dr. Jordan Likes

## 2014-11-04 NOTE — Progress Notes (Signed)
   Subjective:    Patient ID: Marissa Carpenter, female    DOB: [birth year], 29 y.o.   MRN: [id number]  Interview conducted with arabic interpretor. Seen for Same day visit for   CC: right foot pain   Location: right lateral foot  Pain started: 1 month ago Pain is: pressure  Severity: 5-6/10 Medications tried: topical cream  Recent trauma: no prior injury  Similar pain previously: no  Walking makes it worse.   Symptoms Redness:no  Swelling:yes Fever: no Weakness: no Weight loss: no Rash: no  Review of Systems   See HPI for ROS. Objective:  BP 116/60 mmHg  Pulse 82  Temp(Src) 98.3 F (36.8 C) (Oral)  Ht  (1.6 m)  Wt 218 lb 6.4 oz (99.066 kg)  BMI 38.70 kg/m2  LMP 10/15/2014  General: NAD MSK:  Foot Exam:  Laterality: right Appearance: no obvious deformity  Gait: supination of right foot more than left Edema: mildly at the ankle   Tenderness: TTP along the fifth metatarsal and on the lateral aspect from the hindfoot to the forefoot Range of Motion: normal  Laxity: none Neurovascularly intact: yes  Maneuvers: Anterior drawer: normal  Strength:  Dorsiflexion: 5/5 Plantarflexion: 5/5 Inversion: 5/5 Eversion: 5/5    Assessment & Plan:   Right foot pain Has some pes planus and supination of her right foot that may be causing pain on the lateral aspect in the way of PT tendinopathy No acute injury or trauma and no obvious deformity  - right foot xray - ibuprofen for pain PRN  - advised to wear a harder soled shoe on her right  - referral to sports medicine for green insoles.

## 2014-11-05 ENCOUNTER — Encounter: Payer: Self-pay | Admitting: Family Medicine

## 2014-11-05 ENCOUNTER — Ambulatory Visit (HOSPITAL_COMMUNITY)
Admission: RE | Admit: 2014-11-05 | Discharge: 2014-11-05 | Disposition: A | Discharge status: Home / Self Care | Payer: Self-pay | Source: Ambulatory Visit | Attending: Family Medicine | Admitting: Family Medicine

## 2014-11-05 ENCOUNTER — Ambulatory Visit (INDEPENDENT_AMBULATORY_CARE_PROVIDER_SITE_OTHER): Payer: Self-pay | Admitting: Family Medicine

## 2014-11-05 VITALS — Ht 63.0 in | Wt 217.8 lb

## 2014-11-05 DIAGNOSIS — M79671 Pain in right foot: Secondary | ICD-10-CM | POA: Insufficient documentation

## 2014-11-05 DIAGNOSIS — E669 Obesity, unspecified: Secondary | ICD-10-CM

## 2014-11-05 NOTE — Patient Instructions (Addendum)
Goals: 1. Eat at least 3 REAL meals and 1-2 snacks per day.  Aim for no more than 5 hours between eating.  Eat breakfast within one hour of getting up.   - A REAL meal includes protein, starch, and veg's and/or fruit.    - OR:  Would you serve this to a guest in your home, and call it a meal?  - Eat breakfast when you feed your kids.   2. Obtain twice as many veg's as protein or carbohydrate foods for both lunch and dinner. 3. Increase your walking (or any other exercise) to at least 30 min 5 X wk.    - Record the # of minutes you exercise each time on the calendar.    - Bring your Goals Sheet to follow-up appt.

## 2014-11-05 NOTE — Progress Notes (Signed)
Medical Nutrition Therapy:  Appt start time: 0900 end time:  1000.  Assessment:  Primary concerns today: Weight management and hyperlipidemia.   Marissa Carpenter was accompanied by a friend, who helped to interpret during today's appt.    Learning Readiness:  Ready  Barriers to learning/adherence to lifestyle change: Habitual pattern of eating has been 1-2 meals/day for some time now.    Usual eating pattern includes 1-2 meals and 1 snack per day. Frequent foods and beverages include fruit, salad/veg's, fish, chx, beef, 1-2 c blk coffee, flavored water.  Avoided foods include pork, alcohol.   Usual physical activity includes 30-60 min walk 3 X wk.  24-hr recall: (Up at 6:30 AM) B (10 AM)-   1 c coffee Snk ( AM)-    L ( PM)-  water Snk (2:30)-  6 cheese crackers (Nabs) D (5 PM)-  6-8 oz steak, salad, 1 T dressing, French fries, flavored water Snk ( PM)-  1 srvng fruit  Typical day? Yes.    Progress Towards Goal(s):  In progress.   Nutritional Diagnosis:  NB-1.5 Disordered eating pattern As related to eating frequency.  As evidenced by usual eating pattern of 1 meal per day.    Intervention:  Nutrition education.  Handouts given during visit include:  AVS  Goals Sheet  Demonstrated degree of understanding via:  Teach Back   Monitoring/Evaluation:  Dietary intake, exercise, and body weight in 5 week(s).

## 2014-11-06 DIAGNOSIS — M79671 Pain in right foot: Secondary | ICD-10-CM | POA: Insufficient documentation

## 2014-11-06 NOTE — Assessment & Plan Note (Signed)
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

## 2014-11-07 ENCOUNTER — Ambulatory Visit: Payer: Self-pay | Admitting: Family Medicine

## 2014-11-11 ENCOUNTER — Encounter: Payer: Self-pay | Admitting: Family Medicine

## 2014-11-18 ENCOUNTER — Encounter: Payer: Self-pay | Admitting: Family Medicine

## 2014-11-18 ENCOUNTER — Ambulatory Visit (INDEPENDENT_AMBULATORY_CARE_PROVIDER_SITE_OTHER): Payer: Self-pay | Admitting: Family Medicine

## 2014-11-18 VITALS — BP 95/71 | Ht 64.0 in | Wt 218.0 lb

## 2014-11-18 DIAGNOSIS — M7741 Metatarsalgia, right foot: Secondary | ICD-10-CM | POA: Insufficient documentation

## 2014-11-18 DIAGNOSIS — M79671 Pain in right foot: Secondary | ICD-10-CM

## 2014-11-18 NOTE — Assessment & Plan Note (Signed)
I think her foot pain originates in the type of shoe wear which is very minimal as far as support area this is combined with her fairly recent weight gain. There would be no way to get a custom molded orthotic into her current shoe wear as they're very small slender flaps. I was able to place sports insoles in both. On the right when I did an extra small metatarsal pad on the bottom. She had immediate improvement in her pain.  With use of the interpreter I discussed that to really get rid of this type of strain she needs to lose some weight and probably wear more supportive footwear. If she's not getting improvement with just the insoles and I would recommend moving up to a lace up shoe. She gets no improvement with that, then we would have to go with a custom molded orthotic but she would have to have a much bigger shoe so we can fit the orthotic into it. She really likes to wear the dainty shoes so I doubt this would be a good option for her. I answered all her questions.

## 2014-11-18 NOTE — Progress Notes (Signed)
Patient ID: Marissa Carpenter First, female   DOB: 03/09/1985, 29 y.o.   MRN: [id number]  Marissa Carpenter - 29 y.o. female MRN [id number]  Date of birth: 05/05/1985    SUBJECTIVE:     Right foot pain. Was seen by her PCP and had x-ray which was negative. Diagnosed with soft tissue strain of the foot. Here for evaluation for orthotics. Pain is pretty constant, worse the longer she stands on it. Pain is been present for the last month or so, gradually worsening in intensity 3 out 10 at its worst. No swelling. No specific injury. Rest helps it ROS:     She's had some gradual weight gain over the last year partly related to pregnancy. No swelling of either foot or ankle. No ankle pains. No skin rash.  PERTINENT  PMH / PSH FH / / SH:  Past Medical, Surgical, Social, and Family History Reviewed & Updated in the EMR.  Pertinent findings include:  History of gestational diabetes mellitus. Obesity. Hyperlipidemia.  OBJECTIVE: BP 95/71 mmHg  Ht 5\' 4"  (1.626 m)  Wt 218 lb (98.884 kg)  BMI 37.40 kg/m2  LMP 09/14/2014  Physical Exam:  Vital signs are reviewed. GEN.: Well-developed overweight female no acute distress ANKLES: Bilaterally full range of motion, anterior drawer has good end point is symmetrical. FEET: Bilaterally she has a wide foot with some pes planus bilaterally. She is tender to palpation along the area between the fourth and fifth metatarsal. This is particular notable in the forefoot. SKIN: There is no erythema, no ecchymoses, no skin lesions on the right foot VASCULAR: Capillary refill normal bilaterally. Dorsalis and posterior tibialis pulses are 2+ bilaterally equal. GAIT: Normal stride length, symmetrical.  ASSESSMENT & PLAN:  See problem based charting & AVS for pt instructions.

## 2014-12-09 ENCOUNTER — Ambulatory Visit (INDEPENDENT_AMBULATORY_CARE_PROVIDER_SITE_OTHER): Payer: Self-pay | Admitting: Family Medicine

## 2014-12-09 ENCOUNTER — Encounter: Payer: Self-pay | Admitting: Family Medicine

## 2014-12-09 VITALS — Ht 63.0 in | Wt 212.7 lb

## 2014-12-09 DIAGNOSIS — E669 Obesity, unspecified: Secondary | ICD-10-CM

## 2014-12-09 NOTE — Patient Instructions (Addendum)
Goals remain the same: 1. Eat at least 3 REAL meals and 1-2 snacks per day. Aim for no more than 5 hours between eating. Eat breakfast within one hour of getting up.  2. Obtain twice as many veg's as protein or carbohydrate foods for both lunch and dinner. 3. Increase your walking (or any other exercise) to at least 30 min 5 X wk. Increase the amount of exercise as you are able.  (Dance videos for inside exercise?) - Record the # of minutes you exercise each time on the calendar.

## 2014-12-09 NOTE — Progress Notes (Signed)
Medical Nutrition Therapy:  Appt start time: 0900 end time:  1000.  Assessment:  Primary concerns today: Weight management and hyperlipidemia.   Golden's friend came with her today again, to interpret.  Rahill's weight was down 5 lb since appt a month ago.  She has been walking each morning 30-40 min with her husband and 2 young children.  She is normally getting 2-3 meals a day, including veg's at both lunch and dinner.  Tylyn lost track of her Goals Sheet, but has nonetheless been keeping up with her goals on most days.  She was very pleased to see her weight loss, and not surprised b/c she knew she'd made significant behavior changes.    We discussed the role of sweets in Juliana's diet; I advised that small amounts of such foods are appropriate occasionally, and no foods are off-limits.  Mahi's friend brought up the idea of "cheat days," which a friend of theirs uses, which I discouraged.    Asked what obstacles Duchess has encountered and anticipates, she said there have been no major barriers to meeting her goals most days, but she does expect cold weather to be a deterrent to exercise.  A potential solution is inside exercise - online videos of Zumba classes, for example.  She would like to increase her total weekly exercise.    24-hr recall:  (Up at 7 AM) B ( AM)-   Snk ( AM)-   L (2 PM)-  1 pita with beef, 5 oz yogurt, water, 1 c juice Snk ( PM)-   D (8 PM)-  1 c ginger tea, 1/2 tsp honey Snk ( PM)-   Typical day? No. Driving home from out of town yesterday.  Breakfast has usually been a small pita with yogurt/crm chs; typical lunch is veg's, chx salad; typical dinner is usually a meat and a vegetables, and sometimes a starch.    Progress Towards Goal(s):  In progress.   Nutritional Diagnosis:  Progress noted on NB-1.5 Disordered eating pattern As related to eating frequency.  As evidenced by usual eating pattern of 2-3 meals per day.    Intervention:  Nutrition education.  Handouts  given during visit include:  AVS  Demonstrated degree of understanding via:  Teach Back   Monitoring/Evaluation:  Dietary intake, exercise, and body weight in 4 week(s).

## 2015-01-06 ENCOUNTER — Ambulatory Visit: Payer: Self-pay | Admitting: Family Medicine

## 2015-01-13 ENCOUNTER — Encounter (HOSPITAL_COMMUNITY): Payer: Self-pay | Admitting: Emergency Medicine

## 2015-01-13 ENCOUNTER — Emergency Department (HOSPITAL_COMMUNITY)
Admission: EM | Admit: 2015-01-13 | Discharge: 2015-01-14 | Disposition: A | Discharge status: Home / Self Care | Payer: Self-pay | Attending: Emergency Medicine | Admitting: Emergency Medicine

## 2015-01-13 DIAGNOSIS — K2970 Gastritis, unspecified, without bleeding: Secondary | ICD-10-CM | POA: Insufficient documentation

## 2015-01-13 DIAGNOSIS — K219 Gastro-esophageal reflux disease without esophagitis: Secondary | ICD-10-CM | POA: Insufficient documentation

## 2015-01-13 DIAGNOSIS — Z792 Long term (current) use of antibiotics: Secondary | ICD-10-CM | POA: Insufficient documentation

## 2015-01-13 DIAGNOSIS — Z79899 Other long term (current) drug therapy: Secondary | ICD-10-CM | POA: Insufficient documentation

## 2015-01-13 DIAGNOSIS — Z8632 Personal history of gestational diabetes: Secondary | ICD-10-CM | POA: Insufficient documentation

## 2015-01-13 DIAGNOSIS — E119 Type 2 diabetes mellitus without complications: Secondary | ICD-10-CM | POA: Insufficient documentation

## 2015-01-13 DIAGNOSIS — Z3202 Encounter for pregnancy test, result negative: Secondary | ICD-10-CM | POA: Insufficient documentation

## 2015-01-13 LAB — I-STAT BETA HCG BLOOD, ED (MC, WL, AP ONLY)

## 2015-01-13 LAB — CBC
HCT: 37.4 % (ref 36.0–46.0)
HEMOGLOBIN: 12.6 g/dL (ref 12.0–15.0)
MCH: 26.8 pg (ref 26.0–34.0)
MCHC: 33.7 g/dL (ref 30.0–36.0)
MCV: 79.6 fL (ref 78.0–100.0)
Platelets: 305 10*3/uL (ref 150–400)
RBC: 4.7 MIL/uL (ref 3.87–5.11)
RDW: 13.8 % (ref 11.5–15.5)
WBC: 10.2 10*3/uL (ref 4.0–10.5)

## 2015-01-13 NOTE — ED Notes (Signed)
Pt states that she has had generalized abdominal pain x 3 hours. States she went to the dentist this morning and is taking clindamycin, tylenol #3, and a diet pill. Alert and oriented.

## 2015-01-13 NOTE — ED Provider Notes (Signed)
CSN: [id number]     Arrival date & time 01/13/15  2258 History  By signing my name below, I, Marissa Carpenter, attest that this documentation has been prepared under the direction and in the presence of Gilda Creasehristopher J Ferd Horrigan, MD. Electronically Signed: Octavia HeirArianna Carpenter, ED Scribe. 01/14/2015. 12:15 AM.    Chief Complaint  Patient presents with  . Abdominal Pain     The history is provided by the patient. No language interpreter was used.   HPI Comments: Marissa Carpenter is a 29 y.o. female who presents to the Emergency Department complaining of acute onset, intermittent epigastric abdominal pain onset 3 hours ago. She was seen by the dentist this morning and she is currently taking clindamycin and notes she has taken tylenol 3 times today. Pt denies vomiting, diarrhea, dysuria, abdominal surgeries, hx of GERD, and hx of stomach ulcers.  Past Medical History  Diagnosis Date  . Miscarriage 08/26/2010  . Diabetes mellitus without complication (HCC)   . Gestational diabetes     insulin  . Gestational diabetes mellitus, class A2 05/08/2012  . SAB (spontaneous abortion) 2012    x 2 in 2012 - no surgery required  . GERD (gastroesophageal reflux disease)     with pregnancy only - pecid  . Headache(784.0)     otc med prn   Past Surgical History  Procedure Laterality Date  . Cesarean section N/A 05/09/2012    Procedure: Primary CESAREAN SECTION of baby boy  at 0435 APGAR 9/9;  Surgeon: Lavina Hammanodd Meisinger, MD;  Location: [facility];  Service: Obstetrics;  Laterality: N/A;  . Tonsillectomy    . Cesarean section N/A 05/18/2013    Procedure: REPEAT CESAREAN SECTION;  Surgeon: Oliver PilaKathy W Richardson, MD;  Location: [facility];  Service: Obstetrics;  Laterality: N/A;   Family History  Problem Relation Age of Onset  . Miscarriages / Stillbirths Sister   . Clotting disorder Sister   . Alcohol abuse Father   . Diabetes Father   . Hypertension Father   . Diabetes Maternal Grandfather    Social History  Substance Use  Topics  . Smoking status: Never Smoker   . Smokeless tobacco: Former NeurosurgeonUser    Quit date: 08/23/2012     Comment: Hookah 2-3 times per week  . Alcohol Use: No   OB History    Gravida Para Term Preterm AB TAB SAB Ectopic Multiple Living   4 2 2  2  2   2      Review of Systems  Gastrointestinal: Positive for nausea and abdominal pain. Negative for vomiting and diarrhea.  Genitourinary: Negative for dysuria.  All other systems reviewed and are negative.     Allergies  Review of patient's allergies indicates no known allergies.  Home Medications   Prior to Admission medications   Medication Sig Start Date End Date Taking? Authorizing Provider  acetaminophen-codeine (TYLENOL #3) 300-30 MG tablet Take 1 tablet by mouth every 6 (six) hours as needed for moderate pain.  12/26/14  Yes Historical Provider, MD  clindamycin (CLEOCIN) 150 MG capsule Take 1 capsule by mouth 3 (three) times daily. 12/26/14  Yes Historical Provider, MD  orlistat (XENICAL) 120 MG capsule Take 120 mg by mouth daily.   Yes Historical Provider, MD   Triage vitals: BP 128/87 mmHg  Pulse 82  Temp(Src) 97.9 F (36.6 C) (Oral)  Resp 20  SpO2 100%  LMP 01/06/2015 (Approximate) Physical Exam  Constitutional: She is oriented to person, place, and time. She appears well-developed and well-nourished. No  distress.  HENT:  Head: Normocephalic and atraumatic.  Right Ear: Hearing normal.  Left Ear: Hearing normal.  Nose: Nose normal.  Mouth/Throat: Oropharynx is clear and moist and mucous membranes are normal.  Eyes: Conjunctivae and EOM are normal. Pupils are equal, round, and reactive to light.  Neck: Normal range of motion. Neck supple.  Cardiovascular: Regular rhythm, S1 normal and S2 normal.  Exam reveals no gallop and no friction rub.   No murmur heard. Pulmonary/Chest: Effort normal and breath sounds normal. No respiratory distress. She exhibits no tenderness.  Abdominal: Soft. Normal appearance and bowel sounds  are normal. There is no hepatosplenomegaly. There is tenderness. There is no rebound, no guarding, no tenderness at McBurney's point and negative Murphy's sign. No hernia.  Diffuse upper and epigastric tenderness  Musculoskeletal: Normal range of motion.  Neurological: She is alert and oriented to person, place, and time. She has normal strength. No cranial nerve deficit or sensory deficit. Coordination normal. GCS eye subscore is 4. GCS verbal subscore is 5. GCS motor subscore is 6.  Skin: Skin is warm, dry and intact. No rash noted. No cyanosis.  Psychiatric: She has a normal mood and affect. Her speech is normal and behavior is normal. Thought content normal.  Nursing note and vitals reviewed.   ED Course  Procedures  DIAGNOSTIC STUDIES: Oxygen Saturation is 100% on RA, normal by my interpretation.  COORDINATION OF CARE:  11:47 PM Discussed treatment plan with pt at bedside and pt agreed to plan.  Labs Review Labs Reviewed  LIPASE, BLOOD  COMPREHENSIVE METABOLIC PANEL  CBC  URINALYSIS, ROUTINE W REFLEX MICROSCOPIC (NOT AT [facility])  I-STAT BETA HCG BLOOD, ED ([facility], [facility], [facility] ONLY)    Imaging Review No results found. I have personally reviewed and evaluated these images and lab results as part of my medical decision-making.   EKG Interpretation None      MDM   Final diagnoses:  None  gastritis GERD  Presents to the emergency department for evaluation of abdominal pain. Patient had sudden onset of upper abdominal pain 3 hours ago. Patient had diffuse upper abdominal pain and tenderness, but primarily epigastric. Her workup is unremarkable. No evidence of ongoing blood loss. LFTs are normal. Ultrasound did not show any gallbladder disease. Patient has had significant improvement with GI cocktail and Pepcid. This is likely secondary to gastritis and GERD. Will continue outpatient treatment. Patient also complaining of painful hemorrhoids. Prescribe Anusol HC.  I personally performed  the services described in this documentation, which was scribed in my presence. The recorded information has been reviewed and is accurate.   Gilda Crease, MD 01/14/15 0230

## 2015-01-14 ENCOUNTER — Emergency Department (HOSPITAL_COMMUNITY): Payer: Self-pay

## 2015-01-14 ENCOUNTER — Inpatient Hospital Stay (HOSPITAL_COMMUNITY)
Admission: EM | Admit: 2015-01-14 | Discharge: 2015-01-17 | DRG: 390 | Disposition: A | Discharge status: Home / Self Care | Payer: Self-pay | Attending: General Surgery | Admitting: General Surgery

## 2015-01-14 ENCOUNTER — Encounter (HOSPITAL_COMMUNITY): Payer: Self-pay | Admitting: *Deleted

## 2015-01-14 DIAGNOSIS — K5660 Unspecified intestinal obstruction: Principal | ICD-10-CM | POA: Diagnosis present

## 2015-01-14 DIAGNOSIS — Z833 Family history of diabetes mellitus: Secondary | ICD-10-CM

## 2015-01-14 DIAGNOSIS — Z8632 Personal history of gestational diabetes: Secondary | ICD-10-CM

## 2015-01-14 DIAGNOSIS — Z0189 Encounter for other specified special examinations: Secondary | ICD-10-CM

## 2015-01-14 DIAGNOSIS — K56609 Unspecified intestinal obstruction, unspecified as to partial versus complete obstruction: Secondary | ICD-10-CM

## 2015-01-14 DIAGNOSIS — E669 Obesity, unspecified: Secondary | ICD-10-CM

## 2015-01-14 DIAGNOSIS — E785 Hyperlipidemia, unspecified: Secondary | ICD-10-CM | POA: Diagnosis present

## 2015-01-14 LAB — COMPREHENSIVE METABOLIC PANEL
ALBUMIN: 4.1 g/dL (ref 3.5–5.0)
ALT: 19 U/L (ref 14–54)
ALT: 21 U/L (ref 14–54)
ANION GAP: 8 (ref 5–15)
ANION GAP: 8 (ref 5–15)
AST: 19 U/L (ref 15–41)
AST: 20 U/L (ref 15–41)
Albumin: 4 g/dL (ref 3.5–5.0)
Alkaline Phosphatase: 64 U/L (ref 38–126)
Alkaline Phosphatase: 65 U/L (ref 38–126)
BILIRUBIN TOTAL: 0.5 mg/dL (ref 0.3–1.2)
BUN: 8 mg/dL (ref 6–20)
BUN: 9 mg/dL (ref 6–20)
CHLORIDE: 105 mmol/L (ref 101–111)
CO2: 27 mmol/L (ref 22–32)
CO2: 28 mmol/L (ref 22–32)
Calcium: 8.8 mg/dL — ABNORMAL LOW (ref 8.9–10.3)
Calcium: 9.4 mg/dL (ref 8.9–10.3)
Chloride: 108 mmol/L (ref 101–111)
Creatinine, Ser: 0.7 mg/dL (ref 0.44–1.00)
Creatinine, Ser: 0.72 mg/dL (ref 0.44–1.00)
GFR calc Af Amer: >60 mL/min (ref >60)
GFR calc non Af Amer: >60 mL/min (ref >60)
GLUCOSE: 113 mg/dL — AB (ref 65–99)
Glucose, Bld: 117 mg/dL — ABNORMAL HIGH (ref 65–99)
POTASSIUM: 3.6 mmol/L (ref 3.5–5.1)
POTASSIUM: 3.6 mmol/L (ref 3.5–5.1)
SODIUM: 143 mmol/L (ref 135–145)
Sodium: 141 mmol/L (ref 135–145)
TOTAL PROTEIN: 7.5 g/dL (ref 6.5–8.1)
Total Bilirubin: 0.5 mg/dL (ref 0.3–1.2)
Total Protein: 7.1 g/dL (ref 6.5–8.1)

## 2015-01-14 LAB — CBC WITH DIFFERENTIAL/PLATELET
BASOS ABS: 0 10*3/uL (ref 0.0–0.1)
BASOS PCT: 0 %
Eosinophils Absolute: 0 10*3/uL (ref 0.0–0.7)
Eosinophils Relative: 0 %
HEMATOCRIT: 36.4 % (ref 36.0–46.0)
Hemoglobin: 12.1 g/dL (ref 12.0–15.0)
LYMPHS PCT: 14 %
Lymphs Abs: 1.4 10*3/uL (ref 0.7–4.0)
MCH: 26.7 pg (ref 26.0–34.0)
MCHC: 33.2 g/dL (ref 30.0–36.0)
MCV: 80.4 fL (ref 78.0–100.0)
Monocytes Absolute: 0.3 10*3/uL (ref 0.1–1.0)
Monocytes Relative: 3 %
NEUTROS ABS: 8.6 10*3/uL — AB (ref 1.7–7.7)
NEUTROS PCT: 83 %
Platelets: 299 10*3/uL (ref 150–400)
RBC: 4.53 MIL/uL (ref 3.87–5.11)
RDW: 14 % (ref 11.5–15.5)
WBC: 10.4 10*3/uL (ref 4.0–10.5)

## 2015-01-14 LAB — URINALYSIS, ROUTINE W REFLEX MICROSCOPIC
Bilirubin Urine: NEGATIVE
Bilirubin Urine: NEGATIVE
GLUCOSE, UA: NEGATIVE mg/dL
GLUCOSE, UA: NEGATIVE mg/dL
Hgb urine dipstick: NEGATIVE
Ketones, ur: NEGATIVE mg/dL
Ketones, ur: NEGATIVE mg/dL
LEUKOCYTES UA: NEGATIVE
NITRITE: NEGATIVE
Nitrite: NEGATIVE
PH: 6 (ref 5.0–8.0)
PROTEIN: NEGATIVE mg/dL
PROTEIN: NEGATIVE mg/dL
Specific Gravity, Urine: 1.008 (ref 1.005–1.030)
Specific Gravity, Urine: 1.012 (ref 1.005–1.030)
pH: 5.5 (ref 5.0–8.0)

## 2015-01-14 LAB — LIPASE, BLOOD
LIPASE: 32 U/L (ref 11–51)
Lipase: 29 U/L (ref 11–51)

## 2015-01-14 LAB — URINE MICROSCOPIC-ADD ON

## 2015-01-14 MED ORDER — FAMOTIDINE IN NACL 20-0.9 MG/50ML-% IV SOLN
20.0000 mg | Freq: Once | INTRAVENOUS | Status: AC
  Administered 2015-01-14: 20 mg via INTRAVENOUS
  Filled 2015-01-14: qty 50

## 2015-01-14 MED ORDER — ACETAMINOPHEN 650 MG RE SUPP: 650.0000 mg | Freq: Four times a day (QID) | RECTAL | Status: DC | PRN

## 2015-01-14 MED ORDER — LIP MEDEX EX OINT
1.0000 "application " | TOPICAL_OINTMENT | Freq: Two times a day (BID) | CUTANEOUS | Status: DC
  Administered 2015-01-15 – 2015-01-16 (×4): 1 via TOPICAL
  Filled 2015-01-14 (×2): qty 7

## 2015-01-14 MED ORDER — MORPHINE SULFATE (PF) 4 MG/ML IV SOLN
4.0000 mg | Freq: Once | INTRAVENOUS | Status: AC
  Administered 2015-01-14: 4 mg via INTRAVENOUS
  Filled 2015-01-14: qty 1

## 2015-01-14 MED ORDER — HYDROMORPHONE HCL 1 MG/ML IJ SOLN
1.0000 mg | Freq: Once | INTRAMUSCULAR | Status: AC
  Administered 2015-01-14: 1 mg via INTRAVENOUS
  Filled 2015-01-14: qty 1

## 2015-01-14 MED ORDER — ONDANSETRON HCL 4 MG/2ML IJ SOLN
4.0000 mg | Freq: Once | INTRAMUSCULAR | Status: AC
  Administered 2015-01-14: 4 mg via INTRAVENOUS
  Filled 2015-01-14: qty 2

## 2015-01-14 MED ORDER — PHENOL 1.4 % MT LIQD: 2.0000 | OROMUCOSAL | Status: DC | PRN

## 2015-01-14 MED ORDER — IOHEXOL 300 MG/ML  SOLN
100.0000 mL | Freq: Once | INTRAMUSCULAR | Status: AC | PRN
  Administered 2015-01-14: 100 mL via INTRAVENOUS

## 2015-01-14 MED ORDER — KETOROLAC TROMETHAMINE 60 MG/2ML IM SOLN: 60.0000 mg | Freq: Once | INTRAMUSCULAR | Status: DC

## 2015-01-14 MED ORDER — MENTHOL 3 MG MT LOZG: 1.0000 | LOZENGE | OROMUCOSAL | Status: DC | PRN

## 2015-01-14 MED ORDER — MAGIC MOUTHWASH
15.0000 mL | Freq: Four times a day (QID) | ORAL | Status: DC | PRN
  Filled 2015-01-14: qty 15

## 2015-01-14 MED ORDER — SODIUM CHLORIDE 0.9 % IV SOLN
INTRAVENOUS | Status: DC
  Administered 2015-01-15: via INTRAVENOUS

## 2015-01-14 MED ORDER — RANITIDINE HCL 150 MG PO TABS: 150.0000 mg | ORAL_TABLET | Freq: Two times a day (BID) | ORAL | Status: DC

## 2015-01-14 MED ORDER — HYDROCORTISONE 2.5 % RE CREA: TOPICAL_CREAM | RECTAL | Status: DC

## 2015-01-14 MED ORDER — METOCLOPRAMIDE HCL 5 MG/ML IJ SOLN
10.0000 mg | Freq: Once | INTRAMUSCULAR | Status: AC
  Administered 2015-01-14: 10 mg via INTRAVENOUS
  Filled 2015-01-14: qty 2

## 2015-01-14 MED ORDER — GI COCKTAIL ~~LOC~~
30.0000 mL | Freq: Once | ORAL | Status: AC
  Administered 2015-01-14: 30 mL via ORAL
  Filled 2015-01-14: qty 30

## 2015-01-14 MED ORDER — SODIUM CHLORIDE 0.9 % IV BOLUS (SEPSIS): 1000.0000 mL | Freq: Once | INTRAVENOUS | Status: AC

## 2015-01-14 MED ORDER — SODIUM CHLORIDE 0.9 % IV BOLUS (SEPSIS)
1000.0000 mL | Freq: Once | INTRAVENOUS | Status: AC
  Administered 2015-01-14: 1000 mL via INTRAVENOUS

## 2015-01-14 MED ORDER — LACTATED RINGERS IV BOLUS (SEPSIS)
1000.0000 mL | Freq: Once | INTRAVENOUS | Status: AC
  Administered 2015-01-15: 1000 mL via INTRAVENOUS

## 2015-01-14 MED ORDER — LACTATED RINGERS IV BOLUS (SEPSIS): 1000.0000 mL | Freq: Three times a day (TID) | INTRAVENOUS | Status: DC | PRN

## 2015-01-14 MED ORDER — ALUM & MAG HYDROXIDE-SIMETH 200-200-20 MG/5ML PO SUSP: 30.0000 mL | Freq: Four times a day (QID) | ORAL | Status: DC | PRN

## 2015-01-14 NOTE — Discharge Instructions (Signed)
Gastritis, Adult Gastritis is soreness and puffiness (inflammation) of the lining of the stomach. If you do not get help, gastritis can cause bleeding and sores (ulcers) in the stomach. HOME CARE   Only take medicine as told by your doctor.  If you were given antibiotic medicines, take them as told. Finish the medicines even if you start to feel better.  Drink enough fluids to keep your pee (urine) clear or pale yellow.  Avoid foods and drinks that make your problems worse. Foods you may want to avoid include:  Caffeine or alcohol.  Chocolate.  Mint.  Garlic and onions.  Spicy foods.  Citrus fruits, including oranges, lemons, or limes.  Food containing tomatoes, including sauce, chili, salsa, and pizza.  Fried and fatty foods.  Eat small meals throughout the day instead of large meals. GET HELP RIGHT AWAY IF:   You have black or dark red poop (stools).  You throw up (vomit) blood. It may look like coffee grounds.  You cannot keep fluids down.  Your belly (abdominal) pain gets worse.  You have a fever.  You do not feel better after 1 week.  You have any other questions or concerns. MAKE SURE YOU:   Understand these instructions.  Will watch your condition.  Will get help right away if you are not doing well or get worse.   This information is not intended to replace advice given to you by your health care provider. Make sure you discuss any questions you have with your health care provider.   Document Released: 06/30/2007 Document Revised: 04/05/2011 Document Reviewed: 02/24/2011 Elsevier Interactive Patient Education 2016 [employer]          ()   .              ()  .          .         .            .           ()    .       .        :   . . .  .  .         .             .   .          .     :       ().   ()  .    .       .   ()   .  .      1 .      .  :   .   .             .               .            .Marland Kitchen

## 2015-01-14 NOTE — ED Notes (Signed)
Requesting Print production plannerArabic translator

## 2015-01-14 NOTE — ED Notes (Addendum)
Pt reports generalized abd pain x2 days, was seen and treated in ED yesterday for same. Pain 10/10.pt reports she is unable to have a bowel movement. Pt requesting arabic interpretor, told pt interpretor phone will be used when pt speaks with the doctor.   rn not ordering blood work at this time since it was done yesterday.

## 2015-01-14 NOTE — ED Provider Notes (Signed)
CSN: [id number]     Arrival date & time 01/14/15  1748 History   First MD Initiated Contact with Patient 01/14/15 2012     Chief Complaint  Patient presents with  . Abdominal Pain  . Constipation     (Consider location/radiation/quality/duration/timing/severity/associated sxs/prior Treatment) HPI Patient had fairly rapid onset of abdominal pain yesterday. It was upper in nature. At that time she was evaluated with right upper quadrant ultrasound and lab studies. She was given medications for gastritis. Patient reports her symptoms have not improved and they have continued to worsen. She states her abdominal pain has gotten worse. She indicates her pain is predominantly in the upper abdomen. She reports she is very nauseated but has not vomited. She reports that she has not been able to have a bowel movement. She thought she might be constipated. Patient denies any abnormal vaginal discharge or bleeding. She denies pain with urination. She reports she has had 2 C-sections previously. She denies ever having had similar pain. She has not had any other surgeries. Although patient had not had vomiting at home, prior to CT patient vomited large volume of stomach contents. Past Medical History  Diagnosis Date  . Miscarriage 08/26/2010  . Diabetes mellitus without complication (HCC)   . Gestational diabetes     insulin  . Gestational diabetes mellitus, class A2 05/08/2012  . SAB (spontaneous abortion) 2012    x 2 in 2012 - no surgery required  . GERD (gastroesophageal reflux disease)     with pregnancy only - pecid  . Headache(784.0)     otc med prn   Past Surgical History  Procedure Laterality Date  . Cesarean section N/A 05/09/2012    Procedure: Primary CESAREAN SECTION of baby boy  at 0435 APGAR 9/9;  Surgeon: Lavina Hammanodd Meisinger, MD;  Location: [facility];  Service: Obstetrics;  Laterality: N/A;  . Tonsillectomy    . Cesarean section N/A 05/18/2013    Procedure: REPEAT CESAREAN SECTION;  Surgeon:  Oliver PilaKathy W Richardson, MD;  Location: [facility];  Service: Obstetrics;  Laterality: N/A;   Family History  Problem Relation Age of Onset  . Miscarriages / Stillbirths Sister   . Clotting disorder Sister   . Alcohol abuse Father   . Diabetes Father   . Hypertension Father   . Diabetes Maternal Grandfather    Social History  Substance Use Topics  . Smoking status: Never Smoker   . Smokeless tobacco: Former NeurosurgeonUser    Quit date: 08/23/2012     Comment: Hookah 2-3 times per week  . Alcohol Use: No   OB History    Gravida Para Term Preterm AB TAB SAB Ectopic Multiple Living   4 2 2  2  2   2      Review of Systems 10 Systems reviewed and are negative for acute change except as noted in the HPI.    Allergies  Review of patient's allergies indicates no known allergies.  Home Medications   Prior to Admission medications   Medication Sig Start Date End Date Taking? Authorizing Provider  acetaminophen-codeine (TYLENOL #3) 300-30 MG tablet Take 1 tablet by mouth every 6 (six) hours as needed for moderate pain.  12/26/14  Yes Historical Provider, MD  clindamycin (CLEOCIN) 150 MG capsule Take 1 capsule by mouth 3 (three) times daily. 12/26/14  Yes Historical Provider, MD  hydrocortisone (ANUSOL-HC) 2.5 % rectal cream Apply rectally 2 times daily 01/14/15  Yes Gilda Creasehristopher J Pollina, MD  orlistat (XENICAL) 120 MG capsule Take  120 mg by mouth daily.   Yes Historical Provider, MD  ranitidine (ZANTAC) 150 MG tablet Take 1 tablet (150 mg total) by mouth 2 (two) times daily. 01/14/15  Yes Gilda Crease, MD   BP 117/76 mmHg  Pulse 71  Temp(Src) 98.1 F (36.7 C) (Oral)  Resp 16  SpO2 99%  LMP 01/06/2015 (Approximate) Physical Exam  Constitutional: She is oriented to person, place, and time. She appears well-developed and well-nourished.  Patient appears uncomfortable. She is alert and nontoxic. No respiratory distress.  HENT:  Head: Normocephalic and atraumatic.  Eyes: EOM are normal.  Pupils are equal, round, and reactive to light.  Neck: Neck supple.  Cardiovascular: Normal rate, regular rhythm, normal heart sounds and intact distal pulses.   Pulmonary/Chest: Effort normal and breath sounds normal.  Abdominal: Soft. Bowel sounds are normal. She exhibits no distension. There is tenderness.  Upper abdomen is tender. The most tender in epigastrium. Suprapubic area and low inguinal regions are nontender.  Musculoskeletal: Normal range of motion. She exhibits no edema or tenderness.  Neurological: She is alert and oriented to person, place, and time. She has normal strength. Coordination normal. GCS eye subscore is 4. GCS verbal subscore is 5. GCS motor subscore is 6.  Skin: Skin is warm, dry and intact.  Psychiatric: She has a normal mood and affect.    ED Course  Procedures (including critical care time) Labs Review Labs Reviewed  COMPREHENSIVE METABOLIC PANEL - Abnormal; Notable for the following:    Glucose, Bld 117 (*)    Calcium 8.8 (*)    All other components within normal limits  CBC WITH DIFFERENTIAL/PLATELET - Abnormal; Notable for the following:    Neutro Abs 8.6 (*)    All other components within normal limits  URINALYSIS, ROUTINE W REFLEX MICROSCOPIC (NOT AT [facility]) - Abnormal; Notable for the following:    APPearance CLOUDY (*)    All other components within normal limits  WET PREP, GENITAL  LIPASE, BLOOD  GC/CHLAMYDIA PROBE AMP ([place]) NOT AT [facility]    Imaging Review Ct Abdomen Pelvis W Contrast  01/14/2015  CLINICAL DATA:  29 year old female with generalized abdominal pain x2 days. No bowel movement. EXAM: CT ABDOMEN AND PELVIS WITH CONTRAST TECHNIQUE: Multidetector CT imaging of the abdomen and pelvis was performed using the standard protocol following bolus administration of intravenous contrast. CONTRAST:  OMNIPAQUE IOHEXOL 300 MG/ML  SOLN COMPARISON:  Right upper quadrant ultrasound dated 01/14/2015 and CT dated 05/25/2011 FINDINGS: The  visualized lung bases are clear. No intra-abdominal free air. Small free fluid within the pelvis. The liver, gallbladder, pancreas, spleen, adrenal glands, kidneys, visualized ureters, and urinary bladder appear unremarkable. The uterus and the ovaries appear grossly unremarkable. A 2 cm left ovarian dominant cyst versus corpus luteum. Evaluation of the bowel is limited in the absence of oral contrast. There is dilatation of the proximal loops of small bowel with fecalized contents. This dilated loops of bowel measure up to 4.6 cm in diameter. There is an abrupt transition point in the proximal small bowel in the left upper abdomen (series 2, image 32). There is segmental thickening of the wall of the small bowel proximal to the point of obstruction. The distal small bowel demonstrate normal caliber. The etiology of obstruction is indeterminate but may be related to androgen underlying stricture or scarring. A mass is not identified, however evaluation is limited in the absence of oral contrast. The appendix is unremarkable. The abdominal aorta and IVC appear patent.  No portal venous gas identified. There is no adenopathy. The abdominal wall soft tissues appear unremarkable. The osseous structures are intact. IMPRESSION: Proximal small bowel obstruction with transition zone in the left upper abdomen. Clinical correlation and follow-up recommended. Electronically Signed   By: Arash  Radparvar M.D.   On: 01/14/2015 23:30   [place] Elgie Collarded Ruq  01/14/2015  CLINICAL DATA:  RIGHT upper quadrant pain. EXAM: US ABDOMEN LIMITED - RIGHT UPPER QUADRANT COMPARISON:  None. FINDINGS: Gallbladder: No gallstones or wall thickening visualized. No sonographic Murphy sign noted by sonographer. Common bile duct: Diameter: 4.3 mm. Liver: No focal lesion identified. Diffusely mildly echogenic. Hepatopetal portal vein. IMPRESSION: No acute RIGHT upper quadrant process. Hepatic steatosis. Electronically Signed   By: Awilda Metro M.D.   On: 01/14/2015 02:15   I have personally reviewed and evaluated these images and lab results as part of my medical decision-making.   EKG Interpretation None      MDM   Final diagnoses:  Small bowel obstruction [facility])   Patient presents with abdominal pain that has been evolving since yesterday. She identifies it as having been a fairly sudden onset. CT scan shows pattern of small bowel obstruction. The patient is alert and nontoxic. Prior surgical history is for 2 previous C-sections. She is otherwise healthy without medical comorbidity.    Arby Barrette, MD 01/14/15 [phone]

## 2015-01-14 NOTE — ED Notes (Signed)
Pt vomited estimated 500-63600mL of emesis. Pt reports after vomiting she feels more comfortable. Notified provider of patient vomiting. New orders obtained.

## 2015-01-15 ENCOUNTER — Inpatient Hospital Stay (HOSPITAL_COMMUNITY): Payer: MEDICAID

## 2015-01-15 ENCOUNTER — Encounter (HOSPITAL_COMMUNITY): Payer: Self-pay | Admitting: Surgery

## 2015-01-15 ENCOUNTER — Inpatient Hospital Stay (HOSPITAL_COMMUNITY): Payer: Self-pay

## 2015-01-15 DIAGNOSIS — K56609 Unspecified intestinal obstruction, unspecified as to partial versus complete obstruction: Secondary | ICD-10-CM

## 2015-01-15 DIAGNOSIS — E669 Obesity, unspecified: Secondary | ICD-10-CM

## 2015-01-15 LAB — CBC
HEMATOCRIT: 35.2 % — AB (ref 36.0–46.0)
Hemoglobin: 11.8 g/dL — ABNORMAL LOW (ref 12.0–15.0)
MCH: 27.3 pg (ref 26.0–34.0)
MCHC: 33.5 g/dL (ref 30.0–36.0)
MCV: 81.3 fL (ref 78.0–100.0)
PLATELETS: 282 10*3/uL (ref 150–400)
RBC: 4.33 MIL/uL (ref 3.87–5.11)
RDW: 13.9 % (ref 11.5–15.5)
WBC: 12.8 10*3/uL — ABNORMAL HIGH (ref 4.0–10.5)

## 2015-01-15 LAB — BASIC METABOLIC PANEL
Anion gap: 10 (ref 5–15)
BUN: 8 mg/dL (ref 6–20)
CHLORIDE: 105 mmol/L (ref 101–111)
CO2: 26 mmol/L (ref 22–32)
CREATININE: 0.62 mg/dL (ref 0.44–1.00)
Calcium: 8.6 mg/dL — ABNORMAL LOW (ref 8.9–10.3)
GFR calc non Af Amer: >60 mL/min (ref >60)
Glucose, Bld: 124 mg/dL — ABNORMAL HIGH (ref 65–99)
Potassium: 3.5 mmol/L (ref 3.5–5.1)
Sodium: 141 mmol/L (ref 135–145)

## 2015-01-15 MED ORDER — DIATRIZOATE MEGLUMINE & SODIUM 66-10 % PO SOLN: 90.0000 mL | Freq: Once | ORAL | Status: DC

## 2015-01-15 MED ORDER — KETOROLAC TROMETHAMINE 15 MG/ML IJ SOLN
15.0000 mg | Freq: Four times a day (QID) | INTRAMUSCULAR | Status: DC | PRN
  Administered 2015-01-15: 15 mg via INTRAVENOUS
  Administered 2015-01-16: 30 mg via INTRAVENOUS
  Filled 2015-01-15: qty 1
  Filled 2015-01-15: qty 2
  Filled 2015-01-15: qty 1

## 2015-01-15 MED ORDER — LACTATED RINGERS IV BOLUS (SEPSIS): 1000.0000 mL | Freq: Three times a day (TID) | INTRAVENOUS | Status: DC | PRN

## 2015-01-15 MED ORDER — KCL IN DEXTROSE-NACL 40-5-0.45 MEQ/L-%-% IV SOLN
INTRAVENOUS | Status: DC
  Administered 2015-01-15: 100 mL/h via INTRAVENOUS
  Administered 2015-01-15 – 2015-01-16 (×2): via INTRAVENOUS
  Filled 2015-01-15 (×6): qty 1000

## 2015-01-15 MED ORDER — HYDROMORPHONE HCL 1 MG/ML IJ SOLN
0.5000 mg | INTRAMUSCULAR | Status: DC | PRN
Start: 2015-01-15 — End: 2015-01-16

## 2015-01-15 MED ORDER — SODIUM CHLORIDE 0.9 % IV SOLN
8.0000 mg | Freq: Four times a day (QID) | INTRAVENOUS | Status: DC | PRN
  Filled 2015-01-15: qty 4

## 2015-01-15 MED ORDER — DIPHENHYDRAMINE HCL 50 MG/ML IJ SOLN: 12.5000 mg | Freq: Four times a day (QID) | INTRAMUSCULAR | Status: DC | PRN

## 2015-01-15 MED ORDER — PROMETHAZINE HCL 25 MG/ML IJ SOLN
6.2500 mg | INTRAMUSCULAR | Status: DC | PRN
Start: 2015-01-15 — End: 2015-01-17
  Filled 2015-01-15: qty 1

## 2015-01-15 MED ORDER — ONDANSETRON 4 MG PO TBDP: 4.0000 mg | ORAL_TABLET | Freq: Four times a day (QID) | ORAL | Status: DC | PRN

## 2015-01-15 MED ORDER — ONDANSETRON HCL 4 MG/2ML IJ SOLN
4.0000 mg | Freq: Four times a day (QID) | INTRAMUSCULAR | Status: DC | PRN
  Administered 2015-01-15: 4 mg via INTRAVENOUS
  Filled 2015-01-15: qty 2

## 2015-01-15 MED ORDER — METOPROLOL TARTRATE 1 MG/ML IV SOLN
5.0000 mg | Freq: Four times a day (QID) | INTRAVENOUS | Status: DC | PRN
  Filled 2015-01-15: qty 5

## 2015-01-15 MED ORDER — ENOXAPARIN SODIUM 40 MG/0.4ML ~~LOC~~ SOLN
40.0000 mg | SUBCUTANEOUS | Status: DC
  Administered 2015-01-15 – 2015-01-16 (×2): 40 mg via SUBCUTANEOUS
  Filled 2015-01-15 (×3): qty 0.4

## 2015-01-15 MED ORDER — BISACODYL 10 MG RE SUPP: 10.0000 mg | Freq: Two times a day (BID) | RECTAL | Status: DC | PRN

## 2015-01-15 MED ORDER — METHOCARBAMOL 1000 MG/10ML IJ SOLN
1000.0000 mg | Freq: Four times a day (QID) | INTRAVENOUS | Status: DC | PRN
  Filled 2015-01-15: qty 10

## 2015-01-15 NOTE — ED Notes (Addendum)
Spoke with Dr. Gordy SaversS. Gross concerning patient pulling NG tube and not wanting to resert. No new orders  Dr. Gordy SaversS. Gross reported patient will have a higher surgical risk without having the NG to decompress the stomach. .Notified 5W charge nurse about patient pulling NG tube and refusing to have another one placed.

## 2015-01-15 NOTE — ED Notes (Signed)
Upon preparation for transport, pt pulled out her NG tube. Dr has been paged. Patient is now refusing NG tube placement.

## 2015-01-15 NOTE — Progress Notes (Signed)
Subjective: No complaints. She is feeling much better now. No more vomiting despite removing her ng  Objective: Vital signs in last 24 hours: Temp:  [98.1 F (36.7 C)-99.4 F (37.4 C)] 98.6 F (37 C) (12/21 0944) Pulse Rate:  [58-116] 87 (12/21 0944) Resp:  [16-18] 18 (12/21 0944) BP: (110-155)/(61-77) 155/63 mmHg (12/21 0944) SpO2:  [97 %-99 %] 98 % (12/21 0944) Weight:  [96.208 kg (212 lb 1.6 oz)] 96.208 kg (212 lb 1.6 oz) (12/21 0331) Last BM Date: 01/08/15  Intake/Output from previous day: 12/20 0701 - 12/21 0700 In: -  Out: 1075 [Urine:200; Emesis/NG output:875] Intake/Output this shift: Total I/O In: -  Out: 250 [Urine:250]  Resp: clear to auscultation bilaterally Cardio: regular rate and rhythm GI: soft, nontender. not distended  Lab Results:   Recent Labs  01/14/15 2226 01/15/15 0518  WBC 10.4 12.8*  HGB 12.1 11.8*  HCT 36.4 35.2*  PLT 299 282   BMET  Recent Labs  01/14/15 2226 01/15/15 0518  NA 141 141  K 3.6 3.5  CL 105 105  CO2 28 26  GLUCOSE 117* 124*  BUN 8 8  CREATININE 0.70 0.62  CALCIUM 8.8* 8.6*   PT/INR No results for input(s): LABPROT, INR in the last 72 hours. ABG No results for input(s): PHART, HCO3 in the last 72 hours.  Invalid input(s): PCO2, PO2  Studies/Results: Ct Abdomen Pelvis W Contrast  01/14/2015  CLINICAL DATA:  29 year old female with generalized abdominal pain x2 days. No bowel movement. EXAM: CT ABDOMEN AND PELVIS WITH CONTRAST TECHNIQUE: Multidetector CT imaging of the abdomen and pelvis was performed using the standard protocol following bolus administration of intravenous contrast. CONTRAST:  OMNIPAQUE IOHEXOL 300 MG/ML  SOLN COMPARISON:  Right upper quadrant ultrasound dated 01/14/2015 and CT dated 05/25/2011 FINDINGS: The visualized lung bases are clear. No intra-abdominal free air. Small free fluid within the pelvis. The liver, gallbladder, pancreas, spleen, adrenal glands, kidneys, visualized  ureters, and urinary bladder appear unremarkable. The uterus and the ovaries appear grossly unremarkable. A 2 cm left ovarian dominant cyst versus corpus luteum. Evaluation of the bowel is limited in the absence of oral contrast. There is dilatation of the proximal loops of small bowel with fecalized contents. This dilated loops of bowel measure up to 4.6 cm in diameter. There is an abrupt transition point in the proximal small bowel in the left upper abdomen (series 2, image 32). There is segmental thickening of the wall of the small bowel proximal to the point of obstruction. The distal small bowel demonstrate normal caliber. The etiology of obstruction is indeterminate but may be related to androgen underlying stricture or scarring. A mass is not identified, however evaluation is limited in the absence of oral contrast. The appendix is unremarkable. The abdominal aorta and IVC appear patent. No portal venous gas identified. There is no adenopathy. The abdominal wall soft tissues appear unremarkable. The osseous structures are intact. IMPRESSION: Proximal small bowel obstruction with transition zone in the left upper abdomen. Clinical correlation and follow-up recommended. Electronically Signed   By: Elgie Collard M.D.   On: 01/14/2015 23:30   Dg Abd Portable 1v-small Bowel Protocol-position Verification  01/15/2015  CLINICAL DATA:  Nasogastric tube placement EXAM: PORTABLE ABDOMEN - 1 VIEW COMPARISON:  CT from yesterday FINDINGS: Nasogastric tube at the proximal stomach. Unchanged appearance of dilated proximal small bowel consistent with obstruction. No oral contrast has been administered. Unremarkable excretory pyelograms. IMPRESSION: 1. Nasogastric tube tip at the proximal stomach, side port  at the GE junction. 2. Stable small bowel obstruction pattern. Electronically Signed   By: Marnee SpringJonathon  Watts M.D.   On: 01/15/2015 01:57   [place] Abdomen Limited Ruq  01/14/2015  CLINICAL DATA:  RIGHT upper quadrant  pain. EXAM: US ABDOMEN LIMITED - RIGHT UPPER QUADRANT COMPARISON:  None. FINDINGS: Gallbladder: No gallstones or wall thickening visualized. No sonographic Murphy sign noted by sonographer. Common bile duct: Diameter: 4.3 mm. Liver: No focal lesion identified. Diffusely mildly echogenic. Hepatopetal portal vein. IMPRESSION: No acute RIGHT upper quadrant process. Hepatic steatosis. Electronically Signed   By: Awilda Metroourtnay  Bloomer M.D.   On: 01/14/2015 02:15    Anti-infectives: Anti-infectives    None      Assessment/Plan: s/p * No surgery found * continue bowel rest   Recheck abd xrays tomorrow  LOS: 0 days    TOTH III,Thaila Bottoms S 01/15/2015

## 2015-01-15 NOTE — ED Notes (Signed)
Radiology at bedside

## 2015-01-15 NOTE — Progress Notes (Signed)
MD returned page in reference to NG tube. MD stated to document removal of NG tube. No order given to discontinue current orders for NGT. MD aware. Will continue to monitor patient.

## 2015-01-15 NOTE — ED Notes (Signed)
Called pharmacy to verify 4 mg Zofran IV

## 2015-01-15 NOTE — ED Notes (Signed)
Surgeon at bedside.  

## 2015-01-15 NOTE — H&P (Signed)
[place]  [address] [place], [place] [address] Phone: [phone] FAX: [phone]     [place]issa Carpenter  Mar 21, 1985 [id number]  CARE TEAM:  PCP: [place], [place]issa Carpenter  Outpatient Care Team: Patient Care Team: [place], [place] Carpenter as PCP - General (Family Medicine) [place], RD as Dietitian (Family Medicine)  Inpatient Treatment Team: Treatment Team: Attending Provider: Nolon Nations, MD; Registered Nurse: Azzie Glatter, RN; Registered Nurse: Barnetta Chapel, RN; Technician: Crist Infante, NT; Consulting Physician: Nolon Nations, MD  This patient is a 29 y.o.female who presents today for surgical evaluation at the request of Dr Johnney Killian, [facility].   Reason for evaluation: SBO  Pleasant obese female.  Here with a good friend.  Primarily speaks Arabic but is bilingual.  [name] is also helping interpret.  Patient prefers her close friend to be the interpreter.  Patient notes a couple day's history of intermittent crampy abdominal pain.  Worsening nausea.  Pain intensified.  Came to emergency room yesterday.  Ultrasound negative for gallstones.  Rest workup unremarkable.  Symptoms improved with Pepcid and GI cocktail.  Suspicion for gastritis.  Recommend antacid medication.  The patient went home.  She worsened.  Began to throw up.  Return to emergency room.  Examination notes upper abdominal discomfort.  CT scan concerning for dilated proximal small bowel some fecalization suspicious for high-grade small bowel obstruction in jejunum.  Surgical consultation requested.  No personal nor family history of GI/colon cancer, inflammatory bowel disease, irritable bowel syndrome, allergy such as Celiac Sprue, dietary/dairy problems, colitis, ulcers nor gastritis.  No recent sick contacts/gastroenteritis.  No travel outside the country in over a year.  No changes in diet.  No dysphagia to solids or liquids.  No significant heartburn or reflux.  No hematochezia,  hematemesis, coffee ground emesis.  No evidence of prior gastric/peptic ulceration. She had 2 cesarean sections in the past few years.  No other abdominal surgeries.    Past Medical History  Diagnosis Date  . Miscarriage 08/26/2010  . Diabetes mellitus without complication ([employer])   . Gestational diabetes     insulin  . Gestational diabetes mellitus, class A2 05/08/2012  . SAB (spontaneous abortion) 2012    x 2 in 2012 - no surgery required  . GERD (gastroesophageal reflux disease)     with pregnancy only - pecid  . Headache(784.0)     otc med prn  . S/P repeat low transverse C-section 05/18/2013    Past Surgical History  Procedure Laterality Date  . Cesarean section N/A 05/09/2012    Procedure: Primary CESAREAN SECTION of baby boy  at [place] [facility] 9/9;  Surgeon: Cheri Fowler, MD;  Location: [facility];  Service: Obstetrics;  Laterality: N/A;  . Tonsillectomy    . Cesarean section N/A 05/18/2013    Procedure: REPEAT CESAREAN SECTION;  Surgeon: Logan Bores, MD;  Location: [facility];  Service: Obstetrics;  Laterality: N/A;    Social History   Social History  . Marital Status: Married    Spouse Name: N/A  . Number of Children: N/A  . Years of Education: N/A   Occupational History  . Not on file.   Social History Main Topics  . Smoking status: Never Smoker   . Smokeless tobacco: Former Systems developer    Quit date: 08/23/2012     Comment: Hookah 2-3 times per week  . Alcohol Use: No  . Drug Use: No  . Sexual Activity: Yes  Birth Control/ Protection: None   Other Topics Concern  . 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 Miscarriages / Stillbirths Sister   . Clotting disorder Sister   . Alcohol abuse Father   . Diabetes Father   . Hypertension Father   .  Diabetes Maternal Grandfather     Current Facility-Administered Medications  Medication Dose Route Frequency Provider Last Rate Last Dose  . acetaminophen (TYLENOL) suppository 650 mg  650 mg Rectal Q6H PRN Michael Boston, MD      . alum & mag hydroxide-simeth (MAALOX/MYLANTA) 200-200-20 MG/5ML suspension 30 mL  30 mL Oral Q6H PRN Michael Boston, MD      . bisacodyl (DULCOLAX) suppository 10 mg  10 mg Rectal Q12H PRN Michael Boston, MD      . dextrose 5 % and 0.45 % NaCl with KCl 40 mEq/L infusion   Intravenous Continuous Michael Boston, MD      . diatrizoate meglumine-sodium (GASTROGRAFIN) 66-10 % solution 90 mL  90 mL Per NG tube Once Michael Boston, MD      . diphenhydrAMINE (BENADRYL) injection 12.5-25 mg  12.5-25 mg Intravenous Q6H PRN Michael Boston, MD      . enoxaparin (LOVENOX) injection 40 mg  40 mg Subcutaneous Q24H Michael Boston, MD      . HYDROmorphone (DILAUDID) injection 0.5-2 mg  0.5-2 mg Intravenous Q2H PRN Michael Boston, MD      . ketorolac (TORADOL) 15 MG/ML injection 15-30 mg  15-30 mg Intravenous Q6H PRN Michael Boston, MD      . lactated ringers bolus 1,000 mL  1,000 mL Intravenous Q8H PRN Michael Boston, MD      . lactated ringers bolus 1,000 mL  1,000 mL Intravenous Once Michael Boston, MD   1,000 mL at 01/15/15 0013  . lactated ringers bolus 1,000 mL  1,000 mL Intravenous Q8H PRN Michael Boston, MD      . lip balm (CARMEX) ointment 1 application  1 application Topical BID Michael Boston, MD      . magic mouthwash  15 mL Oral QID PRN Michael Boston, MD      . menthol-cetylpyridinium (CEPACOL) lozenge 3 mg  1 lozenge Oral PRN Michael Boston, MD      . methocarbamol (ROBAXIN) 1,000 mg in dextrose 5 % 50 mL IVPB  1,000 mg Intravenous Q6H PRN Michael Boston, MD      . metoprolol (LOPRESSOR) injection 5 mg  5 mg Intravenous Q6H PRN Michael Boston, MD      . ondansetron [facility]) injection 4 mg  4 mg Intravenous Q6H PRN Michael Boston, MD       Or  . ondansetron (ZOFRAN) 8 mg in sodium chloride 0.9 % 50 mL  IVPB  8 mg Intravenous Q6H PRN Michael Boston, MD      . ondansetron (ZOFRAN-ODT) disintegrating tablet 4-8 mg  4-8 mg Oral Q6H PRN Michael Boston, MD      . phenol (CHLORASEPTIC) mouth spray 2 spray  2 spray Mouth/Throat PRN Michael Boston, MD      . promethazine (PHENERGAN) injection 6.25-12.5 mg  6.25-12.5 mg Intravenous Q4H PRN Michael Boston, MD       Current Outpatient Prescriptions  Medication Sig Dispense Refill  .  acetaminophen-codeine (TYLENOL #3) 300-30 MG tablet Take 1 tablet by mouth every 6 (six) hours as needed for moderate pain.   0  . clindamycin (CLEOCIN) 150 MG capsule Take 1 capsule by mouth 3 (three) times daily.  0  . hydrocortisone (ANUSOL-HC) 2.5 % rectal cream Apply rectally 2 times daily 28.35 g 0  . orlistat (XENICAL) 120 MG capsule Take 120 mg by mouth daily.    . ranitidine (ZANTAC) 150 MG tablet Take 1 tablet (150 mg total) by mouth 2 (two) times daily. 60 tablet 0     No Known Allergies  ROS: Constitutional:  No fevers, chills, sweats.  Weight stable Eyes:  No vision changes, No discharge HENT:  No sore throats, nasal drainage Lymph: No neck swelling, No bruising easily Pulmonary:  No cough, productive sputum CV: No orthopnea, PND  Patient walks 30 minutes for about 1 mile without difficulty.  No exertional chest/neck/shoulder/arm pain. GI:  No personal nor family history of GI/colon cancer, inflammatory bowel disease, irritable bowel syndrome, allergy such as Celiac Sprue, dietary/dairy problems, colitis, ulcers nor gastritis.  No recent sick contacts/gastroenteritis.  No travel outside the country.  No changes in diet. Renal: No UTIs, No hematuria Genital:  No drainage, bleeding, masses Musculoskeletal: No severe joint pain.  Good ROM major joints Skin:  No sores or lesions.  No rashes Heme/Lymph:  No easy bleeding.  No swollen lymph nodes Neuro: No focal weakness/numbness.  No seizures Psych: No suicidal ideation.  No hallucinations  BP 117/76 mmHg  Pulse 71   Temp(Src) 98.1 F (36.7 C) (Oral)  Resp 16  SpO2 99%  LMP 01/06/2015 (Approximate)  Physical Exam: General: Pt awake/alert/oriented x4 in no major acute distress Eyes: PERRL, normal EOM. Sclera nonicteric Neuro: CN II-XII intact w/o focal sensory/motor deficits. Lymph: No head/neck/groin lymphadenopathy Psych:  No delerium/psychosis/paranoia HENT: Normocephalic, Mucus membranes moist.  No thrush Neck: Supple, No tracheal deviation Chest: No pain.  Good respiratory excursion. CV:  Pulses intact.  Regular rhythm Abdomen: Soft, Nondistended.  Mildly tender in upper abdomen greater than lower abdomen.  No incarcerated hernias. GU:  Moderate panniculus.  Good hygiene.  Well-healed C-section incision.  No inguinal hernias.  No vaginal bleeding/discharge Ext:  SCDs BLE.  No significant edema.  No cyanosis Skin: No petechiae / purpurea.  No major sores Musculoskeletal: No severe joint pain.  Good ROM major joints   Results:   Labs: Results for orders placed or performed during the hospital encounter of 01/14/15 (from the past 48 hour(s))  Urinalysis, Routine w reflex microscopic     Status: Abnormal   Collection Time: 01/14/15 10:17 PM  Result Value Ref Range   Color, Urine YELLOW YELLOW   APPearance CLOUDY (A) CLEAR   Specific Gravity, Urine 1.012 1.005 - 1.030   pH 6.0 5.0 - 8.0   Glucose, UA NEGATIVE NEGATIVE mg/dL   Hgb urine dipstick NEGATIVE NEGATIVE   Bilirubin Urine NEGATIVE NEGATIVE   Ketones, ur NEGATIVE NEGATIVE mg/dL   Protein, ur NEGATIVE NEGATIVE mg/dL   Nitrite NEGATIVE NEGATIVE   Leukocytes, UA NEGATIVE NEGATIVE    Comment: MICROSCOPIC NOT DONE ON URINES WITH NEGATIVE PROTEIN, BLOOD, LEUKOCYTES, NITRITE, OR GLUCOSE <1000 mg/dL.  Comprehensive metabolic panel     Status: Abnormal   Collection Time: 01/14/15 10:26 PM  Result Value Ref Range   Sodium 141 135 - 145 mmol/L   Potassium 3.6 3.5 - 5.1 mmol/L   Chloride 105 101 - 111 mmol/L   CO2 28 22 - 32  mmol/L    Glucose, Bld 117 (H) 65 - 99 mg/dL   BUN 8 6 - 20 mg/dL   Creatinine, Ser 0.70 0.44 - 1.00 mg/dL   Calcium 8.8 (L) 8.9 - 10.3 mg/dL   Total Protein 7.1 6.5 - 8.1 g/dL   Albumin 4.0 3.5 - 5.0 g/dL   AST 19 15 - 41 U/L   ALT 19 14 - 54 U/L   Alkaline Phosphatase 65 38 - 126 U/L   Total Bilirubin 0.5 0.3 - 1.2 mg/dL   GFR calc non Af Amer >60 >60 mL/min   GFR calc Af Amer >60 >60 mL/min    Comment: (NOTE) The eGFR has been calculated using the CKD EPI equation. This calculation has not been validated in all clinical situations. eGFR's persistently <60 mL/min signify possible Chronic Kidney Disease.    Anion gap 8 5 - 15  Lipase, blood     Status: None   Collection Time: 01/14/15 10:26 PM  Result Value Ref Range   Lipase 32 11 - 51 U/L  CBC with Differential     Status: Abnormal   Collection Time: 01/14/15 10:26 PM  Result Value Ref Range   WBC 10.4 4.0 - 10.5 K/uL   RBC 4.53 3.87 - 5.11 MIL/uL   Hemoglobin 12.1 12.0 - 15.0 g/dL   HCT 36.4 36.0 - 46.0 %   MCV 80.4 78.0 - 100.0 fL   MCH 26.7 26.0 - 34.0 pg   MCHC 33.2 30.0 - 36.0 g/dL   RDW 14.0 11.5 - 15.5 %   Platelets 299 150 - 400 K/uL   Neutrophils Relative % 83 %   Neutro Abs 8.6 (H) 1.7 - 7.7 K/uL   Lymphocytes Relative 14 %   Lymphs Abs 1.4 0.7 - 4.0 K/uL   Monocytes Relative 3 %   Monocytes Absolute 0.3 0.1 - 1.0 K/uL   Eosinophils Relative 0 %   Eosinophils Absolute 0.0 0.0 - 0.7 K/uL   Basophils Relative 0 %   Basophils Absolute 0.0 0.0 - 0.1 K/uL    Imaging / Studies: Ct Abdomen Pelvis W Contrast  01/14/2015  CLINICAL DATA:  29 year old female with generalized abdominal pain x2 days. No bowel movement. EXAM: CT ABDOMEN AND PELVIS WITH CONTRAST TECHNIQUE: Multidetector CT imaging of the abdomen and pelvis was performed using the standard protocol following bolus administration of intravenous contrast. CONTRAST:  156m OMNIPAQUE IOHEXOL 300 MG/ML  SOLN COMPARISON:  Right upper quadrant ultrasound dated  01/14/2015 and CT dated 05/25/2011 FINDINGS: The visualized lung bases are clear. No intra-abdominal free air. Small free fluid within the pelvis. The liver, gallbladder, pancreas, spleen, adrenal glands, kidneys, visualized ureters, and urinary bladder appear unremarkable. The uterus and the ovaries appear grossly unremarkable. A 2 cm left ovarian dominant cyst versus corpus luteum. Evaluation of the bowel is limited in the absence of oral contrast. There is dilatation of the proximal loops of small bowel with fecalized contents. This dilated loops of bowel measure up to 4.6 cm in diameter. There is an abrupt transition point in the proximal small bowel in the left upper abdomen (series 2, image 32). There is segmental thickening of the wall of the small bowel proximal to the point of obstruction. The distal small bowel demonstrate normal caliber. The etiology of obstruction is indeterminate but may be related to androgen underlying stricture or scarring. A mass is not identified, however evaluation is limited in the absence of oral contrast. The appendix is unremarkable. The abdominal aorta and IVC  appear patent. No portal venous gas identified. There is no adenopathy. The abdominal wall soft tissues appear unremarkable. The osseous structures are intact. IMPRESSION: Proximal small bowel obstruction with transition zone in the left upper abdomen. Clinical correlation and follow-up recommended. Electronically Signed   By: Anner Crete M.D.   On: 01/14/2015 23:30   US Abdomen Limited Ruq  01/14/2015  CLINICAL DATA:  RIGHT upper quadrant pain. EXAM: US ABDOMEN LIMITED - RIGHT UPPER QUADRANT COMPARISON:  None. FINDINGS: Gallbladder: No gallstones or wall thickening visualized. No sonographic Murphy sign noted by sonographer. Common bile duct: Diameter: 4.3 mm. Liver: No focal lesion identified. Diffusely mildly echogenic. Hepatopetal portal vein. IMPRESSION: No acute RIGHT upper quadrant process. Hepatic  steatosis. Electronically Signed   By: Elon Alas M.D.   On: 01/14/2015 02:15    Medications / Allergies: per chart  Antibiotics: Anti-infectives    None      Assessment  Kinzlie Denner  29 y.o. female       Problem List:  Principal Problem:   SBO (small bowel obstruction)  Active Problems:   Hyperlipidemia   Small bowel obstruction with proximal transition zone.  Plan:  Admit.  IV fluids.  Nasogastric tube decompression  Small bowel obstruction x-ray protocol.  VTE prophylaxis- SCDs, etc  mobilize as tolerated to help recovery  Given her young age and lack of any obvious mass on CT scan nor hernia, I suspect this is most likely due to adhesions.  Transition zone seems to be more in upper mid abdomen, more way from her C-section incisions.  She is not toxic.  No evidence of perforation or ischemia.  No shock.  Therefore will try and manage this nonoperatively with nasogastric tube decompression and bowel rest.  Should she worsen or not improve, she would benefit from abdominal exploration with lysis adhesions and possible small bowel resection.  Another possibility is perhaps some obstructing mass causing a transition zone but that would seem unlikely.  Nonetheless we will follow her clinically.  The pathophysiology of small bowel obstructions was discussed.  Differential diagnosis discussed.  Questions are answered.  Patient felt comfortable and understanding, especially with clarification with her friend who is bilingual.  She again declined telephone interpreter.   Adin Hector, M.D., F.A.C.S. Gastrointestinal and Minimally Invasive Surgery [facility] [address], [place], [place] [address] [phone] Main / Paging   01/15/2015  Note: Portions of this report may have been transcribed using voice recognition software. Every effort was made to ensure accuracy; however, inadvertent computerized transcription errors  may be present.   Any transcriptional errors that result from this process are unintentional.

## 2015-01-15 NOTE — Progress Notes (Signed)
Encouraged patient to have NG tube placed. Patient continues to refuse and patient aware of complications without placement. Patient states she unable to handle NG tube. MD has been paged.

## 2015-01-16 ENCOUNTER — Inpatient Hospital Stay (HOSPITAL_COMMUNITY): Payer: Self-pay

## 2015-01-16 MED ORDER — ACETAMINOPHEN 325 MG PO TABS
650.0000 mg | ORAL_TABLET | Freq: Four times a day (QID) | ORAL | Status: DC | PRN
  Administered 2015-01-16 (×2): 650 mg via ORAL
  Filled 2015-01-16 (×2): qty 2

## 2015-01-16 NOTE — Progress Notes (Signed)
Patient ID: Marissa Carpenter, female   DOB: 02/27/1985, 29 y.o.   MRN: [id number]    Subjective: Pt feels better today.  Had 2 BMs yesterday.  No nausea, no abdominal pain  Objective: Vital signs in last 24 hours: Temp:  [98.1 F (36.7 C)-99 F (37.2 C)] 98.1 F (36.7 C) (12/22 0526) Pulse Rate:  [57-118] 57 (12/22 0526) Resp:  [16-18] 16 (12/22 0526) BP: (102-155)/(56-75) 105/66 mmHg (12/22 0526) SpO2:  [98 %-100 %] 98 % (12/22 0526) Last BM Date: 01/15/15  Intake/Output from previous day: 12/21 0701 - 12/22 0700 In: 800 [I.V.:800] Out: 1475 [Urine:1475] Intake/Output this shift:    PE: Abd: soft, NT, ND, +BS Heart: regular Lungs: CTAB  Lab Results:   Recent Labs  01/14/15 2226 01/15/15 0518  WBC 10.4 12.8*  HGB 12.1 11.8*  HCT 36.4 35.2*  PLT 299 282   BMET  Recent Labs  01/14/15 2226 01/15/15 0518  NA 141 141  K 3.6 3.5  CL 105 105  CO2 28 26  GLUCOSE 117* 124*  BUN 8 8  CREATININE 0.70 0.62  CALCIUM 8.8* 8.6*   PT/INR No results for input(s): LABPROT, INR in the last 72 hours. CMP     Component Value Date/Time   NA 141 01/15/2015 0518   K 3.5 01/15/2015 0518   CL 105 01/15/2015 0518   CO2 26 01/15/2015 0518   GLUCOSE 124* 01/15/2015 0518   BUN 8 01/15/2015 0518   CREATININE 0.62 01/15/2015 0518   CALCIUM 8.6* 01/15/2015 0518   PROT 7.1 01/14/2015 2226   ALBUMIN 4.0 01/14/2015 2226   AST 19 01/14/2015 2226   ALT 19 01/14/2015 2226   ALKPHOS 65 01/14/2015 2226   BILITOT 0.5 01/14/2015 2226   GFRNONAA >60 01/15/2015 0518   GFRAA >60 01/15/2015 0518   Lipase     Component Value Date/Time   LIPASE 32 01/14/2015 2226       Studies/Results: Ct Abdomen Pelvis W Contrast  01/14/2015  CLINICAL DATA:  29 year old female with generalized abdominal pain x2 days. No bowel movement. EXAM: CT ABDOMEN AND PELVIS WITH CONTRAST TECHNIQUE: Multidetector CT imaging of the abdomen and pelvis was performed using the standard protocol following bolus  administration of intravenous contrast. CONTRAST:  100mL OMNIPAQUE IOHEXOL 300 MG/ML  SOLN COMPARISON:  Right upper quadrant ultrasound dated 01/14/2015 and CT dated 05/25/2011 FINDINGS: The visualized lung bases are clear. No intra-abdominal free air. Small free fluid within the pelvis. The liver, gallbladder, pancreas, spleen, adrenal glands, kidneys, visualized ureters, and urinary bladder appear unremarkable. The uterus and the ovaries appear grossly unremarkable. A 2 cm left ovarian dominant cyst versus corpus luteum. Evaluation of the bowel is limited in the absence of oral contrast. There is dilatation of the proximal loops of small bowel with fecalized contents. This dilated loops of bowel measure up to 4.6 cm in diameter. There is an abrupt transition point in the proximal small bowel in the left upper abdomen (series 2, image 32). There is segmental thickening of the wall of the small bowel proximal to the point of obstruction. The distal small bowel demonstrate normal caliber. The etiology of obstruction is indeterminate but may be related to androgen underlying stricture or scarring. A mass is not identified, however evaluation is limited in the absence of oral contrast. The appendix is unremarkable. The abdominal aorta and IVC appear patent. No portal venous gas identified. There is no adenopathy. The abdominal wall soft tissues appear unremarkable. The osseous structures are intact. IMPRESSION:  Proximal small bowel obstruction with transition zone in the left upper abdomen. Clinical correlation and follow-up recommended. Electronically Signed   By: Elgie Collard M.D.   On: 01/14/2015 23:30   Dg Abd Portable 1v-small Bowel Protocol-position Verification  01/15/2015  CLINICAL DATA:  Nasogastric tube placement EXAM: PORTABLE ABDOMEN - 1 VIEW COMPARISON:  CT from yesterday FINDINGS: Nasogastric tube at the proximal stomach. Unchanged appearance of dilated proximal small bowel consistent with  obstruction. No oral contrast has been administered. Unremarkable excretory pyelograms. IMPRESSION: 1. Nasogastric tube tip at the proximal stomach, side port at the GE junction. 2. Stable small bowel obstruction pattern. Electronically Signed   By: Marnee Spring M.D.   On: 01/15/2015 01:57    Anti-infectives: Anti-infectives    None       Assessment/Plan  1. SBO, resolving -repeat film this am, but if it looks better, will give clear liquids and advance as tolerates. DVT proph -SCDs/lovenox   LOS: 1 day    Sharona Rovner E 01/16/2015, 9:13 AM Pager: [phone]

## 2015-01-17 NOTE — Discharge Summary (Signed)
Patient ID: Marissa [place], female   DOB: 03/27/1985, 29 y.o.   MRN: [id number] Patient ID: Marissa Carpenter MRN: [id number] DOB/AGE: 10/16/1985 29 y.o.  Admit date: 01/14/2015 Discharge date: 01/17/2015  Procedures: none  Consults: None  Reason for Admission:  Pleasant obese female. Here with a good friend. Primarily speaks Arabic but is bilingual. Friend is also helping interpret. Patient prefers her close friend to be the interpreter.  Patient notes a couple day's history of intermittent crampy abdominal pain. Worsening nausea. Pain intensified. Came to emergency room yesterday. Ultrasound negative for gallstones. Rest workup unremarkable. Symptoms improved with Pepcid and GI cocktail. Suspicion for gastritis. Recommend antacid medication. The patient went home. She worsened. Began to throw up. Return to emergency room. Examination notes upper abdominal discomfort. CT scan concerning for dilated proximal small bowel some fecalization suspicious for high-grade small bowel obstruction in jejunum. Surgical consultation requested.  No personal nor family history of GI/colon cancer, inflammatory bowel disease, irritable bowel syndrome, allergy such as Celiac Sprue, dietary/dairy problems, colitis, ulcers nor gastritis. No recent sick contacts/gastroenteritis. No travel outside the country in over a year. No changes in diet. No dysphagia to solids or liquids. No significant heartburn or reflux. No hematochezia, hematemesis, coffee ground emesis. No evidence of prior gastric/peptic ulceration. She had 2 cesarean sections in the past few years. No other abdominal surgeries.  Admission Diagnoses:  1. SBO  Hospital Course: The patient was admitted and had an NGT placed, but removed it shortly after.  By early the next morning, the patient already felt better and was passing flatus.  Her NGT was left out.  She rapidly improved and began having BMs etc.  Her diet was advanced as tolerated  and she was stable on HD 3 for DC home.  PE: Abd: soft, NT, ND, +BS Heart: regular Lungs: CTAB  Discharge Diagnoses:  Principal Problem:   SBO (small bowel obstruction)  Active Problems:   Hyperlipidemia   Obesity (BMI 30-39.9)   Discharge Medications:   Medication List    TAKE these medications        acetaminophen-codeine 300-30 MG tablet  Commonly known as:  TYLENOL #3  Take 1 tablet by mouth every 6 (six) hours as needed for moderate pain.     clindamycin 150 MG capsule  Commonly known as:  CLEOCIN  Take 1 capsule by mouth 3 (three) times daily.     hydrocortisone 2.5 % rectal cream  Commonly known as:  ANUSOL-HC  Apply rectally 2 times daily     orlistat 120 MG capsule  Commonly known as:  XENICAL  Take 120 mg by mouth daily.     ranitidine 150 MG tablet  Commonly known as:  ZANTAC  Take 1 tablet (150 mg total) by mouth 2 (two) times daily.        Discharge Instructions:     Follow-up Information    Follow up with Jazma Phelps, DO.   Specialty:  Family Medicine   Why:  As needed   Contact information:   [address] [place]  [address] [phone]       Signed: Mykhia Danish E 01/17/2015, 9:35 AM  

## 2015-01-17 NOTE — Care Management Note (Signed)
Case Management Note  Patient Details  Name: Marissa Carpenter MRN: [id number] Date of Birth: 02/23/1985  Subjective/Objective: 29 y/o f admitted w/SBO. From home.                   Action/Plan:d/c home no needs or orders.   Expected Discharge Date:                  Expected Discharge Plan:  Home/Self Care  In-House Referral:     Discharge planning Services  CM Consult  Post Acute Care Choice:    Choice offered to:     DME Arranged:    DME Agency:     HH Arranged:    HH Agency:     Status of Service:  Completed, signed off  Medicare Important Message Given:    Date Medicare IM Given:    Medicare IM give by:    Date Additional Medicare IM Given:    Additional Medicare Important Message give by:     If discussed at Long Length of Stay Meetings, dates discussed:    Additional Comments:  Lanier ClamMahabir, Cleotha Tsang, RN 01/17/2015, 10:13 AM

## 2015-01-17 NOTE — Progress Notes (Deleted)
Patient ID: Marissa Carpenter, female   DOB: 05/13/1985, 29 y.o.   MRN: [id number] Patient ID: Marissa Carpenter MRN: [id number] DOB/AGE: 29/01/1985 29 y.o.  Admit date: 01/14/2015 Discharge date: 01/17/2015  Procedures: none  Consults: None  Reason for Admission:  Pleasant obese female. Here with a good friend. Primarily speaks Arabic but is bilingual. Clearence CheekFriend is also helping interpret. Patient prefers her close friend to be the interpreter.  Patient notes a couple day's history of intermittent crampy abdominal pain. Worsening nausea. Pain intensified. Came to emergency room yesterday. Ultrasound negative for gallstones. Rest workup unremarkable. Symptoms improved with Pepcid and GI cocktail. Suspicion for gastritis. Recommend antacid medication. The patient went home. She worsened. Began to throw up. Return to emergency room. Examination notes upper abdominal discomfort. CT scan concerning for dilated proximal small bowel some fecalization suspicious for high-grade small bowel obstruction in jejunum. Surgical consultation requested.  No personal nor family history of GI/colon cancer, inflammatory bowel disease, irritable bowel syndrome, allergy such as Celiac Sprue, dietary/dairy problems, colitis, ulcers nor gastritis. No recent sick contacts/gastroenteritis. No travel outside the country in over a year. No changes in diet. No dysphagia to solids or liquids. No significant heartburn or reflux. No hematochezia, hematemesis, coffee ground emesis. No evidence of prior gastric/peptic ulceration. She had 2 cesarean sections in the past few years. No other abdominal surgeries.  Admission Diagnoses:  1. SBO  Hospital Course: The patient was admitted and had an NGT placed, but removed it shortly after.  By early the next morning, the patient already felt better and was passing flatus.  Her NGT was left out.  She rapidly improved and began having BMs etc.  Her diet was advanced as tolerated  and she was stable on HD 3 for DC home.  PE: Abd: soft, NT, ND, +BS Heart: regular Lungs: CTAB  Discharge Diagnoses:  Principal Problem:   SBO (small bowel obstruction)  Active Problems:   Hyperlipidemia   Obesity (BMI 30-39.9)   Discharge Medications:   Medication List    TAKE these medications        acetaminophen-codeine 300-30 MG tablet  Commonly known as:  TYLENOL #3  Take 1 tablet by mouth every 6 (six) hours as needed for moderate pain.     clindamycin 150 MG capsule  Commonly known as:  CLEOCIN  Take 1 capsule by mouth 3 (three) times daily.     hydrocortisone 2.5 % rectal cream  Commonly known as:  ANUSOL-HC  Apply rectally 2 times daily     orlistat 120 MG capsule  Commonly known as:  XENICAL  Take 120 mg by mouth daily.     ranitidine 150 MG tablet  Commonly known as:  ZANTAC  Take 1 tablet (150 mg total) by mouth 2 (two) times daily.        Discharge Instructions:     Follow-up Information    Follow up with Caryl AdaJazma Phelps, DO.   Specialty:  Family Medicine   Why:  As needed   Contact information:   [address] [place] [address] [phone]       Signed: Letha CapeOSBORNE,Dorathy Stallone E 01/17/2015, 9:35 AM

## 2015-02-05 ENCOUNTER — Encounter: Payer: Self-pay | Admitting: Obstetrics and Gynecology

## 2015-02-05 ENCOUNTER — Ambulatory Visit (INDEPENDENT_AMBULATORY_CARE_PROVIDER_SITE_OTHER): Payer: Self-pay | Admitting: Obstetrics and Gynecology

## 2015-02-05 VITALS — BP 106/67 | HR 81 | Temp 98.4°F | Wt 206.1 lb

## 2015-02-05 DIAGNOSIS — R300 Dysuria: Secondary | ICD-10-CM

## 2015-02-05 DIAGNOSIS — K5669 Other intestinal obstruction: Secondary | ICD-10-CM

## 2015-02-05 DIAGNOSIS — R197 Diarrhea, unspecified: Secondary | ICD-10-CM

## 2015-02-05 DIAGNOSIS — Z09 Encounter for follow-up examination after completed treatment for conditions other than malignant neoplasm: Secondary | ICD-10-CM

## 2015-02-05 DIAGNOSIS — K56609 Unspecified intestinal obstruction, unspecified as to partial versus complete obstruction: Secondary | ICD-10-CM

## 2015-02-05 LAB — POCT URINALYSIS DIPSTICK
Bilirubin, UA: NEGATIVE
GLUCOSE UA: NEGATIVE
Ketones, UA: NEGATIVE
Nitrite, UA: NEGATIVE
Protein, UA: 30
UROBILINOGEN UA: 0.2
pH, UA: 5.5

## 2015-02-05 LAB — POCT UA - MICROSCOPIC ONLY: WBC, Ur, HPF, POC: >20

## 2015-02-05 NOTE — Patient Instructions (Addendum)
Small Bowel Obstruction A small bowel obstruction is a blockage in the small bowel. The small bowel, which is also called the small intestine, is a long, slender tube that connects the stomach to the colon. When a person eats and drinks, food and fluids go from the stomach to the small bowel. This is where most of the nutrients in the food and fluids are absorbed. A small bowel obstruction will prevent food and fluids from passing through the small bowel as they normally do during digestion. The small bowel can become partially or completely blocked. This can cause symptoms such as abdominal pain, vomiting, and bloating. If this condition is not treated, it can be dangerous because the small bowel could rupture. CAUSES Common causes of this condition include:  Scar tissue from previous surgery or radiation treatment.  Recent surgery. This may cause the movements of the bowel to slow down and cause food to block the intestine.  Hernias.  Inflammatory bowel disease (colitis).  Twisting of the bowel (volvulus).  Tumors.  A foreign body.  Slipping of a part of the bowel into another part (intussusception). SYMPTOMS Symptoms of this condition include:  Abdominal pain. This may be dull cramps or sharp pain. It may occur in one area, or it may be present in the entire abdomen. Pain can range from mild to severe, depending on the degree of obstruction.  Nausea and vomiting. Vomit may be greenish or a yellow bile color.  Abdominal bloating.  Constipation.  Lack of passing gas.  Frequent belching.  Diarrhea. This may occur if the obstruction is partial and runny stool is able to leak around the obstruction. DIAGNOSIS This condition may be diagnosed based on a physical exam, medical history, and X-rays of the abdomen. You may also have other tests, such as a CT scan of the abdomen and pelvis. TREATMENT Treatment for this condition depends on the cause and severity of the problem.  Treatment options may include:  Bed rest along with fluids and pain medicines that are given through an IV tube inserted into one of your veins. Sometimes, this is all that is needed for the obstruction to improve.  Following a simple diet. In some cases, a clear liquid diet may be required for several days. This allows the bowel to rest.  Placement of a small tube (nasogastric tube) into the stomach. When the bowel is blocked, it usually swells up like a balloon that is filled with air and fluids. The air and fluids may be removed by suction through the nasogastric tube. This can help with pain, discomfort, and nausea. It can also help the obstruction to clear up faster.  Surgery. This may be required if other treatments do not work. Bowel obstruction from a hernia may require early surgery and can be an emergency procedure. Surgery may also be required for scar tissue that causes frequent or severe obstructions. HOME CARE INSTRUCTIONS  Get plenty of rest.  Follow instructions from your health care provider about eating restrictions. You may need to avoid solid foods and consume only clear liquids until your condition improves.  Take over-the-counter and prescription medicines only as told by your health care provider.  Keep all follow-up visits as told by your health care provider. This is important. SEEK MEDICAL CARE IF:  You have a fever.  You have chills. SEEK IMMEDIATE MEDICAL CARE IF:  You have increased pain or cramping.  You vomit blood.  You have uncontrolled vomiting or nausea.  You cannot drink  fluids because of vomiting or pain.  You develop confusion.  You begin feeling very dry or thirsty (dehydrated).  You have severe bloating.  You feel extremely weak or you faint.   This information is not intended to replace advice given to you by your health care provider. Make sure you discuss any questions you have with your health care provider.   Document Released:  03/30/2005 Document Revised: 10/02/2014 Document Reviewed: 03/07/2014 Elsevier Interactive Patient Education [employer]2016 Elsevier Inc.            .                 .             .            .                  .         .          .                 .       :       .   .            . .    ( ).    (). .  .        ().     :   .        .            .           .   Marland Kitchen.      ??  .  . .   .  . Marland Kitchen.              .              .           . :        .     :                 .           .    .                .    .    (  )  .             .             .         .         .  .         Marland Kitchen.               .              .        .         Marland Kitchen.             Marland Kitchen.                  .             .  .    :  .  . SEEK    :     .   .      .        .    .        ().   .       .

## 2015-02-05 NOTE — Progress Notes (Signed)
Video interpretor used during visit  Subjective: Chief Complaint  Patient presents with  . Follow-up    Hosp F/U small bowl obstruction     HPI: Marissa Carpenter is a 30 y.o. presenting to clinic today to discuss the following:  #Hosp f/u: -patient recently admitted to hospital on 12/20-12/21 for SBO -while hospitalized they were able to avoid surgery by just decompressing her stomach with NG tube and bowel rest -cause of obstruction hypothesized to be due to adhesions from c-sections -patient follows up today stating she feels better -still has some discomfort that is 2/10 and mainly when she eats -has not followed with any other specialist after hospitalization -CT scan showed lack of any obvious mass nor hernia. No evidence of perforation or ischemia. -ROS: denies nausea, vomiting, fever, weight loss, loss of appetitie  #Diarrhea: -she also endorses diarrhea after she eats -not a new problem -She had this same problem 2 weeks before hospitalization -eats well but after she eats goes to bathroom -no family history of GI disorders  -Loose stools; feels like stomach doesn't digest food -foul odor to stool -have have BM about 3x/day -ROS: denies blood in stool  #Dysuria: -mentioned she has some burning and discomfort with urination as I was walking out room  ROS noted in HPI.  Past Medical, Surgical, Social, and Family History Reviewed & Updated per EMR.   Objective: BP 106/67 mmHg  Pulse 81  Temp(Src) 98.4 F (36.9 C) (Oral)  Wt 206 lb 1.6 oz (93.486 kg)  LMP 01/31/2015  Breastfeeding? No  Physical Exam  Constitutional: She is well-developed, well-nourished, and in no distress.  HENT:  Mouth/Throat: Oropharynx is clear and moist and mucous membranes are normal.  Cardiovascular: Normal rate, regular rhythm, normal heart sounds and intact distal pulses.   Pulmonary/Chest: Effort normal and breath sounds normal.  Abdominal: Soft. Normal appearance and bowel sounds  are normal. There is no hepatosplenomegaly. There is tenderness in the right lower quadrant, suprapubic area and left lower quadrant. There is no rigidity, no rebound and no guarding. No hernia.  Musculoskeletal: Normal range of motion. She exhibits no edema.  Skin: Skin is warm and dry.    Assessment/Plan: 1. Hospital discharge follow-up/SBO (small bowel obstruction)  Patient well appearing and doing better since hospitalization. Vitals stable and afebrile. She still has mild discomfort after eating. Recovered with bowel rest and NG decompression. Discussed possibility of this happening again. Return precautions given and emergency care. Should she worsen or not improve, she would benefit from abdominal exploration with lysis adhesions and possible small bowel resection. -Handout given -No additional referrals needed at this time  2. Diarrhea, unspecified type. Patient with diarrhea after eating for the last several weeks. Worse now after hospitalization. Stool is loose and foul smelling.  -will collect GI panel concern for possible C.diff or other infectious cause -can also work up inflammatory causes if above negative -patient to follow-up in 3 weeks or sooner as needed -discussed staying hydrated and signs of needing to return sooner  3. Dysuria. Complained of some dysuria right when I was walking out the room. Will collect UA and have patient follow-up. Unable to fully access due to time.  - POCT urinalysis dipstick - POCT UA - Microscopic Only   Greater than 50% of time was spent in counseling and coordination of care with patient. This was also due to language barrier. Total of >25 minutes.   Caryl AdaJazma Jaideep Pollack, DO 02/05/2015, 8:58 AM PGY-2, [facility]

## 2015-02-06 ENCOUNTER — Telehealth: Payer: Self-pay | Admitting: Obstetrics and Gynecology

## 2015-02-06 DIAGNOSIS — R1030 Lower abdominal pain, unspecified: Secondary | ICD-10-CM

## 2015-02-06 LAB — GASTROINTESTINAL PATHOGEN PANEL PCR
C. DIFFICILE TOX A/B, PCR: NEGATIVE
CAMPYLOBACTER, PCR: NEGATIVE
Cryptosporidium, PCR: NEGATIVE
E COLI 0157, PCR: NEGATIVE
E coli (ETEC) LT/ST PCR: NEGATIVE
E coli (STEC) stx1/stx2, PCR: NEGATIVE
Giardia lamblia, PCR: NEGATIVE
Norovirus, PCR: NEGATIVE
Rotavirus A, PCR: NEGATIVE
SALMONELLA, PCR: NEGATIVE
Shigella, PCR: NEGATIVE

## 2015-02-06 NOTE — Telephone Encounter (Signed)
Tried calling patient to discuss the results of her UA from yesterday. It appears she may have kidney stones. I would like for patient to go get a CT scan to evaluate further. Please try calling patient and informing her of this. She needs to stay well hydrated. If pain medications needed I will prescribe. I will call her about results of scan when I get them and from there we can discuss treatment plans.   Thanks!

## 2015-02-06 NOTE — Assessment & Plan Note (Signed)
Patient well appearing and doing better since hospitalization. Vitals stable and afebrile. She still has mild discomfort after eating. Recovered with bowel rest and NG decompression. Discussed possibility of this happening again. Return precautions given and emergency care. Should she worsen or not improve, she would benefit from abdominal exploration.

## 2015-02-11 NOTE — Addendum Note (Signed)
Addended by: Pincus Large on: 02/11/2015 10:50 AM   Modules accepted: Orders

## 2015-02-11 NOTE — Telephone Encounter (Signed)
Cll pt - advsd of UA results - appearing that she may have kidney stones. PCP would like her to have a CT scan done to evaluate further. Pt asked that I contact her spouse Caesar Chestnut at [phone] and advise him due to language barrier. I contacted the spouse - advsd of wife's UA results and that she may have kidney stones. Advsd PCP would like pt to have CT scan done to evaluate further. Asked how pt was feeling, if she was in any pain. Spouse stated that his wife did tell him she was in pain. Spouse agreed to wife having CT scan done. Will place order for CT scan

## 2015-02-11 NOTE — Telephone Encounter (Signed)
Clld (spoke to Toni)/scheduled CT scan 02/14/2015 11:00 am at [facility], [address]. Clld spouse, Lily Lovings of scheduling and time he needed to have his wife there by 10:45 am. He stated he understood where the location was and time he needed to have his wife there.

## 2015-02-11 NOTE — Telephone Encounter (Signed)
Order placed. Thanks for following up on this.

## 2015-02-14 ENCOUNTER — Ambulatory Visit (HOSPITAL_BASED_OUTPATIENT_CLINIC_OR_DEPARTMENT_OTHER)
Admission: RE | Admit: 2015-02-14 | Discharge: 2015-02-14 | Disposition: A | Discharge status: Home / Self Care | Payer: Self-pay | Source: Ambulatory Visit | Attending: Family Medicine | Admitting: Family Medicine

## 2015-02-14 DIAGNOSIS — R1030 Lower abdominal pain, unspecified: Secondary | ICD-10-CM | POA: Insufficient documentation

## 2015-02-19 ENCOUNTER — Telehealth: Payer: Self-pay | Admitting: *Deleted

## 2015-02-19 NOTE — Telephone Encounter (Signed)
Patient and husband were calling to check on CT results from 02/14/15.  Will forward to MD to advise. Jazmin Hartsell,CMA

## 2015-02-20 NOTE — Telephone Encounter (Signed)
Clld pt  - advsd of Provider's notation. Pt states she is feeling better as well.Pt states she understands the CT results and all other results.  Advsd to follow up as needed.

## 2015-02-20 NOTE — Telephone Encounter (Signed)
Please inform patient that her CT study was normal. No signs of kidney stones. Also her obstruction seen on last imaging (why she was in the hospital) has resolved. Also reassure that her stool study was normal so no major infectious causes for why she was having diarrhea (could have been viral illness).

## 2015-02-28 ENCOUNTER — Other Ambulatory Visit (HOSPITAL_COMMUNITY)
Admission: RE | Admit: 2015-02-28 | Discharge: 2015-02-28 | Disposition: A | Discharge status: Home / Self Care | Payer: MEDICAID | Source: Ambulatory Visit | Attending: Family Medicine | Admitting: Family Medicine

## 2015-02-28 ENCOUNTER — Ambulatory Visit (INDEPENDENT_AMBULATORY_CARE_PROVIDER_SITE_OTHER): Payer: Self-pay | Admitting: Obstetrics and Gynecology

## 2015-02-28 VITALS — BP 123/64 | HR 66 | Temp 98.4°F | Wt 206.0 lb

## 2015-02-28 DIAGNOSIS — Z1151 Encounter for screening for human papillomavirus (HPV): Secondary | ICD-10-CM | POA: Insufficient documentation

## 2015-02-28 DIAGNOSIS — R102 Pelvic and perineal pain: Secondary | ICD-10-CM

## 2015-02-28 DIAGNOSIS — K56609 Unspecified intestinal obstruction, unspecified as to partial versus complete obstruction: Secondary | ICD-10-CM

## 2015-02-28 DIAGNOSIS — K5669 Other intestinal obstruction: Secondary | ICD-10-CM

## 2015-02-28 DIAGNOSIS — Z01419 Encounter for gynecological examination (general) (routine) without abnormal findings: Secondary | ICD-10-CM | POA: Insufficient documentation

## 2015-02-28 DIAGNOSIS — R300 Dysuria: Secondary | ICD-10-CM

## 2015-02-28 DIAGNOSIS — Z124 Encounter for screening for malignant neoplasm of cervix: Secondary | ICD-10-CM

## 2015-02-28 LAB — POCT URINALYSIS DIPSTICK
Bilirubin, UA: NEGATIVE
Glucose, UA: NEGATIVE
Ketones, UA: NEGATIVE
LEUKOCYTES UA: NEGATIVE
NITRITE UA: NEGATIVE
PROTEIN UA: NEGATIVE
Spec Grav, UA: 1.015
UROBILINOGEN UA: 0.2
pH, UA: 5.5

## 2015-02-28 LAB — POCT WET PREP (WET MOUNT): CLUE CELLS WET PREP WHIFF POC: NEGATIVE

## 2015-02-28 LAB — POCT UA - MICROSCOPIC ONLY

## 2015-02-28 NOTE — Patient Instructions (Signed)
Pelvic Pain, Female °Pelvic pain is pain felt below the belly button and between your hips. It can be caused by many different things. It is important to get help right away. This is especially true for severe, sharp, or unusual pain that comes on suddenly.  °HOME CARE °· Only take medicine as told by your doctor. °· Rest as told by your doctor. °· Eat a healthy diet, such as fruits, vegetables, and lean meats. °· Drink enough fluids to keep your pee (urine) clear or pale yellow, or as told. °· Avoid sex (intercourse) if it causes pain. °· Apply warm or cold packs to your lower belly (abdomen). Use the type of pack that helps the pain. °· Avoid situations that cause you stress. °· Keep a journal to track your pain. Write down: °¨ When the pain started. °¨ Where it is located. °¨ If there are things that seem to be related to the pain, such as food or your period. °· Follow up with your doctor as told. °GET HELP RIGHT AWAY IF:  °· You have heavy bleeding from the vagina. °· You have more pelvic pain. °· You feel lightheaded or pass out (faint). °· You have chills. °· You have pain when you pee or have blood in your pee. °· You cannot stop having watery poop (diarrhea). °· You cannot stop throwing up (vomiting). °· You have a fever or lasting symptoms for more than 3 days. °· You have a fever and your symptoms suddenly get worse. °· You are being physically or sexually abused. °· Your medicine does not help your pain. °· You have fluid (discharge) coming from your vagina that is not normal. °MAKE SURE YOU: °· Understand these instructions. °· Will watch your condition. °· Will get help if you are not doing well or get worse. °  °This information is not intended to replace advice given to you by your health care provider. Make sure you discuss any questions you have with your health care provider. °  °Document Released: 06/30/2007 Document Revised: 02/01/2014 Document Reviewed: 05/03/2011 °Elsevier Interactive Patient  Education ©2016 Elsevier Inc. ° °

## 2015-02-28 NOTE — Progress Notes (Signed)
    Subjective: Chief Complaint  Patient presents with  . Vaginal Pain    HPI: Marissa Carpenter is a 30 y.o. presenting to clinic today to discuss the following:  #Pelvic Pain -pelvic pain now for 3 weeks -last time in office signs of possible kidney stone with blood and oxylate cru=ystals in urine -CT renal did not show any stones -pain mainly when she uses the bathroom (dysuria) -localizes to lower abdomen  -denies any vaginal bleeding, dicsharge -sexually active with husband. -No history of STDs or kidney stones Patient's last menstrual period was 01/31/2015.  #Follow-up GI complaints: -no longer having diarrhea -stool studies were normal -no further issues with appetite after SBO -appetite improved   ROS noted in HPI.  Smoking status noted. Past Medical, Surgical, Social, and Family History Reviewed & Updated per EMR.   Objective: BP 123/64 mmHg  Pulse 66  Temp(Src) 98.4 F (36.9 C) (Oral)  Wt 206 lb (93.441 kg)  LMP 01/31/2015 Vitals and nursing notes reviewed  Physical Exam  Constitutional: She is well-developed, well-nourished, and in no distress.  Abdominal: Soft. Normal appearance and bowel sounds are normal. She exhibits no mass. There is tenderness in the right lower quadrant and left lower quadrant. There is no guarding and no CVA tenderness.  Genitourinary: Vagina normal and cervix normal. Cervix exhibits no motion tenderness. No vaginal discharge found.  Blood appreciated at cervical os.    Assessment/Plan: 1. Pelvic pain in female Etiology unclear at this time. UA improved from prior one 3 weeks ago; less blood and no crystals. Wet prep also normal. No CMT on exam. Adnexa were tender on examination. Believe most likely source of pelvic pain is ovarian cyst. Reassured patient. Will hold off on imaging at this time as prior studies of area were all normal. Also etiology could be due to patient getting ready to pass renal stones. Pain has migrated to lower in  her abdomen. Denies any stones in urine. Obtained pap smear today. Will follow-up results.   2. Dysuria Pelvic pain and dysuria. See assessment above. UA was unremarkable. No concern for pyelo or UTI at this time.   3. Screening for malignant neoplasm of cervix Due for pap since its been 3 years. Pap performed.   4. SBO (small bowel obstruction)  Resolved.    Orders Placed This Encounter  Procedures  . POCT urinalysis dipstick  . POCT UA - Microscopic Only  . POCT Wet Prep ([address] [place])    [place], [place] 02/28/2015, 3:45 PM PGY-2, [facility]

## 2015-03-04 LAB — CYTOLOGY - PAP

## 2015-03-05 ENCOUNTER — Encounter: Payer: Self-pay | Admitting: Obstetrics and Gynecology

## 2015-03-05 ENCOUNTER — Telehealth: Payer: Self-pay | Admitting: *Deleted

## 2015-03-05 DIAGNOSIS — R102 Pelvic and perineal pain: Secondary | ICD-10-CM | POA: Insufficient documentation

## 2015-03-05 NOTE — Telephone Encounter (Signed)
Spoke with patient and husband (he speaks english).  They are aware of results. Marissa Carpenter,CMA

## 2015-03-05 NOTE — Telephone Encounter (Signed)
-----   Message from Pincus Large, DO sent at 03/05/2015  2:29 PM EST ----- [facility] BLUE team, please call pt and let her know that her pelvic labs and studies were all normal(pap normal, no infections) Do not feel pelvic US needed at this time but if still a problem in 2 weeks to let me know and will order further imaging.   Thanks! Pincus Large, DO

## 2015-07-18 ENCOUNTER — Inpatient Hospital Stay (HOSPITAL_COMMUNITY)
Admission: AD | Admit: 2015-07-18 | Discharge: 2015-07-18 | Disposition: A | Discharge status: Home / Self Care | Payer: Medicaid Other | Source: Ambulatory Visit | Attending: Family Medicine | Admitting: Family Medicine

## 2015-07-18 ENCOUNTER — Encounter: Payer: Self-pay | Admitting: Family Medicine

## 2015-07-18 ENCOUNTER — Inpatient Hospital Stay (HOSPITAL_COMMUNITY): Payer: Medicaid Other

## 2015-07-18 ENCOUNTER — Ambulatory Visit (INDEPENDENT_AMBULATORY_CARE_PROVIDER_SITE_OTHER): Payer: Self-pay | Admitting: Family Medicine

## 2015-07-18 ENCOUNTER — Encounter (HOSPITAL_COMMUNITY): Payer: Self-pay

## 2015-07-18 VITALS — BP 111/65 | HR 73 | Temp 98.9°F | Wt 205.6 lb

## 2015-07-18 DIAGNOSIS — O26899 Other specified pregnancy related conditions, unspecified trimester: Secondary | ICD-10-CM

## 2015-07-18 DIAGNOSIS — R300 Dysuria: Secondary | ICD-10-CM

## 2015-07-18 DIAGNOSIS — Z3490 Encounter for supervision of normal pregnancy, unspecified, unspecified trimester: Secondary | ICD-10-CM

## 2015-07-18 DIAGNOSIS — O24111 Pre-existing diabetes mellitus, type 2, in pregnancy, first trimester: Secondary | ICD-10-CM | POA: Diagnosis not present

## 2015-07-18 DIAGNOSIS — R1032 Left lower quadrant pain: Secondary | ICD-10-CM | POA: Insufficient documentation

## 2015-07-18 DIAGNOSIS — E119 Type 2 diabetes mellitus without complications: Secondary | ICD-10-CM | POA: Insufficient documentation

## 2015-07-18 DIAGNOSIS — N926 Irregular menstruation, unspecified: Secondary | ICD-10-CM

## 2015-07-18 DIAGNOSIS — Z331 Pregnant state, incidental: Secondary | ICD-10-CM

## 2015-07-18 DIAGNOSIS — O26891 Other specified pregnancy related conditions, first trimester: Secondary | ICD-10-CM | POA: Diagnosis not present

## 2015-07-18 DIAGNOSIS — Z3A01 Less than 8 weeks gestation of pregnancy: Secondary | ICD-10-CM | POA: Insufficient documentation

## 2015-07-18 DIAGNOSIS — O3680X0 Pregnancy with inconclusive fetal viability, not applicable or unspecified: Secondary | ICD-10-CM

## 2015-07-18 DIAGNOSIS — O9989 Other specified diseases and conditions complicating pregnancy, childbirth and the puerperium: Secondary | ICD-10-CM

## 2015-07-18 DIAGNOSIS — R109 Unspecified abdominal pain: Secondary | ICD-10-CM

## 2015-07-18 LAB — POCT URINALYSIS DIP (MANUAL ENTRY)
BILIRUBIN UA: NEGATIVE
BILIRUBIN UA: NEGATIVE
Glucose, UA: NEGATIVE
NITRITE UA: NEGATIVE
PH UA: 5.5
Protein Ur, POC: NEGATIVE
RBC UA: NEGATIVE
SPEC GRAV UA: 1.01
Urobilinogen, UA: 0.2

## 2015-07-18 LAB — POCT URINE PREGNANCY: Preg Test, Ur: POSITIVE — AB

## 2015-07-18 LAB — URINE MICROSCOPIC-ADD ON: RBC / HPF: NONE SEEN RBC/hpf (ref 0–5)

## 2015-07-18 LAB — CBC WITH DIFFERENTIAL/PLATELET
Basophils Absolute: 0 10*3/uL (ref 0.0–0.1)
Basophils Relative: 1 %
Eosinophils Absolute: 0.1 10*3/uL (ref 0.0–0.7)
Eosinophils Relative: 2 %
HEMATOCRIT: 35.6 % — AB (ref 36.0–46.0)
HEMOGLOBIN: 12.3 g/dL (ref 12.0–15.0)
LYMPHS ABS: 2.3 10*3/uL (ref 0.7–4.0)
LYMPHS PCT: 35 %
MCH: 26.7 pg (ref 26.0–34.0)
MCHC: 34.6 g/dL (ref 30.0–36.0)
MCV: 77.4 fL — AB (ref 78.0–100.0)
MONOS PCT: 3 %
Monocytes Absolute: 0.2 10*3/uL (ref 0.1–1.0)
NEUTROS ABS: 3.9 10*3/uL (ref 1.7–7.7)
NEUTROS PCT: 59 %
Platelets: 285 10*3/uL (ref 150–400)
RBC: 4.6 MIL/uL (ref 3.87–5.11)
RDW: 14.8 % (ref 11.5–15.5)
WBC: 6.6 10*3/uL (ref 4.0–10.5)

## 2015-07-18 LAB — URINALYSIS, ROUTINE W REFLEX MICROSCOPIC
BILIRUBIN URINE: NEGATIVE
GLUCOSE, UA: NEGATIVE mg/dL
Hgb urine dipstick: NEGATIVE
KETONES UR: NEGATIVE mg/dL
NITRITE: NEGATIVE
PH: 5.5 (ref 5.0–8.0)
PROTEIN: NEGATIVE mg/dL
Specific Gravity, Urine: 1.01 (ref 1.005–1.030)

## 2015-07-18 LAB — WET PREP, GENITAL
CLUE CELLS WET PREP: NONE SEEN
SPERM: NONE SEEN
Trich, Wet Prep: NONE SEEN
YEAST WET PREP: NONE SEEN

## 2015-07-18 LAB — POCT UA - MICROSCOPIC ONLY

## 2015-07-18 LAB — HCG, QUANTITATIVE, PREGNANCY: hCG, Beta Chain, Quant, S: 185 m[IU]/mL — ABNORMAL HIGH (ref <5)

## 2015-07-18 NOTE — Progress Notes (Signed)
    Subjective: CC: pregnancy HPI: Patient is a 30 y.o. female with a past medical history of SBO presenting to clinic today for a same day appt for a pregnancy test.  Marissa Carpenter is a 30 year old presenting for pregnancy test.  She has a sure LMP of 06/14/2015. They were not expecting to be pregnant. They are excited about the pregnancy. The patient denies any vaginal bleeding or vaginal discharge. She does endorse left-sided abdominal and flank pain. She notes this has been going on for approximally 2 weeks now. She states that it is sharp in nature and does not radiate. It is constant. She endorses some dysuria over the last 1-2 weeks as well. No urinary frequency or urgency. No N/V, fevers, or chills.   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 She is a history of gestational diabetes with both of her term pregnancies. No history of ectopic pregnancies.  Social History: She was smoking hookah but stopped.  She denies any alcohol use. She is currently not taking any medications regularly.   ROS: All other systems reviewed and are negative.  Past Medical History Patient Active Problem List   Diagnosis Date Noted  . Pregnancy 07/18/2015  . Pelvic pain in female 03/05/2015  . SBO (small bowel obstruction)  01/15/2015  . Obesity (BMI 30-39.9) 01/15/2015  . Metatarsalgia of right foot 11/18/2014  . Acanthosis nigricans 08/08/2013  . Low back pain on left side with sciatica 10/20/2012  . Hyperlipidemia 03/04/2011  . GERD (gastroesophageal reflux disease) 01/15/2011    Medications- reviewed and updated No current outpatient prescriptions on file.   No current facility-administered medications for this visit.    Objective: Office vital signs reviewed. BP 111/65 mmHg  Pulse 73  Temp(Src) 98.9 F (37.2 C) (Oral)  Wt 205 lb 9.6 oz (93.26 kg)  SpO2 99%  LMP 06/14/2015 (Exact Date)   Physical Examination:  General: Awake, alert, well-  nourished, NAD Cardio: RRR, no m/r/g noted.  Pulm: No increased WOB.  CTAB, without wheezes, rhonchi or crackles noted.  GI: +BS. Non-distended. Tenderness over the left adnexa without rebound or guarding. Some mid tenderness in the left flank without CVA tenderness.  GU: deffered Extremities: Trace pitting edema  +urine pregnancy test U/A: small LE, negative nitrite, 1-5 epis, trace bacteria,   Assessment/Plan: Pregnancy The patient is presenting with a positive pregnancy test with an L LMP dating her at 4 weeks 6 days. She is also endorsing left-sided abdominal pain with tenderness over the left adnexa on my exam.  She notes that her pain has been constant over the last 2 weeks. She denies any nausea, vomiting, or fevers with these symptoms. -Given the concerns for an ectopic pregnancy. Patient was advised to go to the MAU so that she may obtain a transvaginal ultrasound to confirm an intrauterine pregnancy. -We did discuss medications and food and pregnancy. Patient was advised to not smoke hookah anymore. She was advised to not start drinking alcohol. We discussed being evaluated in the future if she has new abdominal pain or vaginal bleeding during her pregnancy.    Orders Placed This Encounter  Procedures  . POCT urine pregnancy  . POCT urinalysis dipstick  . POCT UA - Microscopic Only    No orders of the defined types were placed in this encounter.    Joanna Puffrystal S. Dorsey PGY-2, [facility]

## 2015-07-18 NOTE — Patient Instructions (Addendum)
You are currently 4 weeks and 6 days pregnant. Your due date is Mar 19, 2016.   It does not look like you have a urinary tract infection. Because of your pain in the left side, you should go to the emergency room at the [facility] hospital to ensure that your pregnancy is in the uterus and not somewhere it shouldn't be.  You should leave straight from here to go there. The address is: [address] [place], [place] [address]  First Trimester of Pregnancy The first trimester of pregnancy is from week 1 until the end of week 12 (months 1 through 3). A week after a sperm fertilizes an egg, the egg will implant on the wall of the uterus. This embryo will begin to develop into a baby. Genes from you and your partner are forming the baby. The female genes determine whether the baby is a boy or a girl. At 6-8 weeks, the eyes and face are formed, and the heartbeat can be seen on ultrasound. At the end of 12 weeks, all the baby's organs are formed.  Now that you are pregnant, you will want to do everything you can to have a healthy baby. Two of the most important things are to get good prenatal care and to follow your health care provider's instructions. Prenatal care is all the medical care you receive before the baby's birth. This care will help prevent, find, and treat any problems during the pregnancy and childbirth. BODY CHANGES Your body goes through many changes during pregnancy. The changes vary from woman to woman.   You may gain or lose a couple of pounds at first.  You may feel sick to your stomach (nauseous) and throw up (vomit). If the vomiting is uncontrollable, call your health care provider.  You may tire easily.  You may develop headaches that can be relieved by medicines approved by your health care provider.  You may urinate more often. Painful urination may mean you have a bladder infection.  You may develop heartburn as a result of your pregnancy.  You may develop constipation  because certain hormones are causing the muscles that push waste through your intestines to slow down.  You may develop hemorrhoids or swollen, bulging veins (varicose veins).  Your breasts may begin to grow larger and become tender. Your nipples may stick out more, and the tissue that surrounds them (areola) may become darker.  Your gums may bleed and may be sensitive to brushing and flossing.  Dark spots or blotches (chloasma, mask of pregnancy) may develop on your face. This will likely fade after the baby is born.  Your menstrual periods will stop.  You may have a loss of appetite.  You may develop cravings for certain kinds of food.  You may have changes in your emotions from day to day, such as being excited to be pregnant or being concerned that something may go wrong with the pregnancy and baby.  You may have more vivid and strange dreams.  You may have changes in your hair. These can include thickening of your hair, rapid growth, and changes in texture. Some women also have hair loss during or after pregnancy, or hair that feels dry or thin. Your hair will most likely return to normal after your baby is born. WHAT TO EXPECT AT YOUR PRENATAL VISITS During a routine prenatal visit:  You will be weighed to make sure you and the baby are growing normally.  Your blood pressure will be taken.  Your abdomen will be measured to track your baby's growth.  The fetal heartbeat will be listened to starting around week 10 or 12 of your pregnancy.  Test results from any previous visits will be discussed. Your health care provider may ask you:  How you are feeling.  If you are feeling the baby move.  If you have had any abnormal symptoms, such as leaking fluid, bleeding, severe headaches, or abdominal cramping.  If you are using any tobacco products, including cigarettes, chewing tobacco, and electronic cigarettes.  If you have any questions. Other tests that may be performed  during your first trimester include:  Blood tests to find your blood type and to check for the presence of any previous infections. They will also be used to check for low iron levels (anemia) and Rh antibodies. Later in the pregnancy, blood tests for diabetes will be done along with other tests if problems develop.  Urine tests to check for infections, diabetes, or protein in the urine.  An ultrasound to confirm the proper growth and development of the baby.  An amniocentesis to check for possible genetic problems.  Fetal screens for spina bifida and Down syndrome.  You may need other tests to make sure you and the baby are doing well.  HIV (human immunodeficiency virus) testing. Routine prenatal testing includes screening for HIV, unless you choose not to have this test. HOME CARE INSTRUCTIONS  Medicines  Follow your health care provider's instructions regarding medicine use. Specific medicines may be either safe or unsafe to take during pregnancy.  Take your prenatal vitamins as directed.  If you develop constipation, try taking a stool softener if your health care provider approves. Diet  Eat regular, well-balanced meals. Choose a variety of foods, such as meat or vegetable-based protein, fish, milk and low-fat dairy products, vegetables, fruits, and whole grain breads and cereals. Your health care provider will help you determine the amount of weight gain that is right for you.  Avoid raw meat and uncooked cheese. These carry germs that can cause birth defects in the baby.  Eating four or five small meals rather than three large meals a day may help relieve nausea and vomiting. If you start to feel nauseous, eating a few soda crackers can be helpful. Drinking liquids between meals instead of during meals also seems to help nausea and vomiting.  If you develop constipation, eat more high-fiber foods, such as fresh vegetables or fruit and whole grains. Drink enough fluids to keep  your urine clear or pale yellow. Activity and Exercise  Exercise only as directed by your health care provider. Exercising will help you:  Control your weight.  Stay in shape.  Be prepared for labor and delivery.  Experiencing pain or cramping in the lower abdomen or low back is a good sign that you should stop exercising. Check with your health care provider before continuing normal exercises.  Try to avoid standing for long periods of time. Move your legs often if you must stand in one place for a long time.  Avoid heavy lifting.  Wear low-heeled shoes, and practice good posture.  You may continue to have sex unless your health care provider directs you otherwise. Relief of Pain or Discomfort  Wear a good support bra for breast tenderness.   Take warm sitz baths to soothe any pain or discomfort caused by hemorrhoids. Use hemorrhoid cream if your health care provider approves.   Rest with your legs elevated if you have leg  cramps or low back pain.  If you develop varicose veins in your legs, wear support hose. Elevate your feet for 15 minutes, 3-4 times a day. Limit salt in your diet. Prenatal Care  Schedule your prenatal visits by the twelfth week of pregnancy. They are usually scheduled monthly at first, then more often in the last 2 months before delivery.  Write down your questions. Take them to your prenatal visits.  Keep all your prenatal visits as directed by your health care provider. Safety  Wear your seat belt at all times when driving.  Make a list of emergency phone numbers, including numbers for family, friends, the hospital, and police and fire departments. General Tips  Ask your health care provider for a referral to a local prenatal education class. Begin classes no later than at the beginning of month 6 of your pregnancy.  Ask for help if you have counseling or nutritional needs during pregnancy. Your health care provider can offer advice or refer you  to specialists for help with various needs.  Do not use hot tubs, steam rooms, or saunas.  Do not douche or use tampons or scented sanitary pads.  Do not cross your legs for long periods of time.  Avoid cat litter boxes and soil used by cats. These carry germs that can cause birth defects in the baby and possibly loss of the fetus by miscarriage or stillbirth.  Avoid all smoking, herbs, alcohol, and medicines not prescribed by your health care provider. Chemicals in these affect the formation and growth of the baby.  Do not use any tobacco products, including cigarettes, chewing tobacco, and electronic cigarettes. If you need help quitting, ask your health care provider. You may receive counseling support and other resources to help you quit.  Schedule a dentist appointment. At home, brush your teeth with a soft toothbrush and be gentle when you floss. SEEK MEDICAL CARE IF:   You have dizziness.  You have mild pelvic cramps, pelvic pressure, or nagging pain in the abdominal area.  You have persistent nausea, vomiting, or diarrhea.  You have a bad smelling vaginal discharge.  You have pain with urination.  You notice increased swelling in your face, hands, legs, or ankles. SEEK IMMEDIATE MEDICAL CARE IF:   You have a fever.  You are leaking fluid from your vagina.  You have spotting or bleeding from your vagina.  You have severe abdominal cramping or pain.  You have rapid weight gain or loss.  You vomit blood or material that looks like coffee grounds.  You are exposed to [place] measles and have never had them.  You are exposed to fifth disease or chickenpox.  You develop a severe headache.  You have shortness of breath.  You have any kind of trauma, such as from a fall or a car accident.   This information is not intended to replace advice given to you by your health care provider. Make sure you discuss any questions you have with your health care provider.    Document Released: 01/05/2001 Document Revised: 02/01/2014 Document Reviewed: 11/21/2012 Elsevier Interactive Patient Education [employer]2016 Elsevier Inc.

## 2015-07-18 NOTE — MAU Provider Note (Signed)
History     CSN: [id number]  Arrival date and time: 07/18/15 1049   None     Chief Complaint  Patient presents with  . Abdominal Pain   HPI Marissa Carpenter is 30 y.o. [id number] [redacted]w[redacted]d weeks presenting with lower abdominal back --left sided pain that began 2 months ago. Worse today.  Neg for vaginal bleeding or abnormal vaginal discharge.  States nothing makes the pain worse or better.  Rates as 5/10.   She had + UPT last week at home.  Hx of 2 C-Section, 2 miscarriages and diabetes.   Past Medical History  Diagnosis Date  . Miscarriage 08/26/2010  . Diabetes mellitus without complication (HCC)   . SAB (spontaneous abortion) 2012    x 2 in 2012 - no surgery required  . Headache(784.0)     otc med prn    Past Surgical History  Procedure Laterality Date  . Cesarean section N/A 05/09/2012    Procedure: Primary CESAREAN SECTION of baby boy  at 0435 APGAR 9/9;  Surgeon: Lavina Hamman, MD;  Location: [facility];  Service: Obstetrics;  Laterality: N/A;  . Tonsillectomy    . Cesarean section N/A 05/18/2013    Procedure: REPEAT CESAREAN SECTION;  Surgeon: Oliver Pila, MD;  Location: [facility];  Service: Obstetrics;  Laterality: N/A;    Family History  Problem Relation Age of Onset  . Miscarriages / Stillbirths Sister   . Clotting disorder Sister   . Alcohol abuse Father   . Diabetes Father   . Hypertension Father   . Diabetes Maternal Grandfather     Social History  Substance Use Topics  . Smoking status: Never Smoker   . Smokeless tobacco: Former Neurosurgeon    Quit date: 08/23/2012     Comment: Hookah 2-3 times per week  . Alcohol Use: No    Allergies: No Known Allergies  Prescriptions prior to admission  Medication Sig Dispense Refill Last Dose  . hydrocortisone (ANUSOL-HC) 2.5 % rectal cream Apply rectally 2 times daily (Patient not taking: Reported on 07/18/2015) 28.35 g 0 01/13/2015 at Unknown time  . ranitidine (ZANTAC) 150 MG tablet Take 1 tablet (150 mg total) by mouth 2 (two)  times daily. (Patient not taking: Reported on 07/18/2015) 60 tablet 0 01/14/2015 at Unknown time    Review of Systems  Constitutional: Negative for fever and chills.  Gastrointestinal: Positive for abdominal pain (lower left). Negative for nausea and vomiting.  Genitourinary: Negative for dysuria, urgency, frequency and hematuria.       Negative for vaginal bleeding.  Musculoskeletal: Positive for back pain (lower left sided pain.).  Neurological: Negative for headaches.   Physical Exam   Blood pressure 116/73, pulse 74, temperature 98.9 F (37.2 C), temperature source Oral, resp. rate 18, height  (1.626 m), weight 205 lb 6.4 oz (93.169 kg), last menstrual period 06/14/2015.  Physical Exam  Nursing note and vitals reviewed. Constitutional: She is oriented to person, place, and time. She appears well-developed and well-nourished. No distress.  HENT:  Head: Normocephalic.  Neck: Normal range of motion.  Cardiovascular: Normal rate.   Respiratory: Effort normal.  GI: Soft. She exhibits no distension and no mass. There is no tenderness. There is no rebound, no guarding and no CVA tenderness.  Genitourinary: There is no rash, tenderness or lesion on the right labia. There is no rash, tenderness or lesion on the left labia. Uterus is not enlarged and not tender. Cervix exhibits no motion tenderness, no discharge and no friability.  Right adnexum displays no mass, no tenderness and no fullness. Left adnexum displays tenderness. Left adnexum displays no mass and no fullness. No erythema, tenderness or bleeding in the vagina. Injury: small amount of white discharge without odor. Vaginal discharge found.  Neurological: She is alert and oriented to person, place, and time.  Skin: Skin is warm and dry.  Psychiatric: She has a normal mood and affect. Her behavior is normal.   Results for orders placed or performed during the hospital encounter of 07/18/15 (from the past 24 hour(s))  Urinalysis,  Routine w reflex microscopic (not at [facility])     Status: Abnormal   Collection Time: 07/18/15 11:05 AM  Result Value Ref Range   Color, Urine STRAW (A) YELLOW   APPearance CLEAR CLEAR   Specific Gravity, Urine 1.010 1.005 - 1.030   pH 5.5 5.0 - 8.0   Glucose, UA NEGATIVE NEGATIVE mg/dL   Hgb urine dipstick NEGATIVE NEGATIVE   Bilirubin Urine NEGATIVE NEGATIVE   Ketones, ur NEGATIVE NEGATIVE mg/dL   Protein, ur NEGATIVE NEGATIVE mg/dL   Nitrite NEGATIVE NEGATIVE   Leukocytes, UA LARGE (A) NEGATIVE  Urine microscopic-add on     Status: Abnormal   Collection Time: 07/18/15 11:05 AM  Result Value Ref Range   Squamous Epithelial / LPF 0-5 (A) NONE SEEN   WBC, UA 6-30 0 - 5 WBC/hpf   RBC / HPF NONE SEEN 0 - 5 RBC/hpf   Bacteria, UA RARE (A) NONE SEEN  Wet prep, genital     Status: Abnormal   Collection Time: 07/18/15 11:20 AM  Result Value Ref Range   Yeast Wet Prep HPF POC NONE SEEN NONE SEEN   Trich, Wet Prep NONE SEEN NONE SEEN   Clue Cells Wet Prep HPF POC NONE SEEN NONE SEEN   WBC, Wet Prep HPF POC MANY (A) NONE SEEN   Sperm NONE SEEN   CBC with Differential/Platelet     Status: Abnormal   Collection Time: 07/18/15 11:28 AM  Result Value Ref Range   WBC 6.6 4.0 - 10.5 K/uL   RBC 4.60 3.87 - 5.11 MIL/uL   Hemoglobin 12.3 12.0 - 15.0 g/dL   HCT 16.135.6 (L) 09.636.0 - 04.546.0 %   MCV 77.4 (L) 78.0 - 100.0 fL   MCH 26.7 26.0 - 34.0 pg   MCHC 34.6 30.0 - 36.0 g/dL   RDW 40.914.8 81.111.5 - 91.415.5 %   Platelets 285 150 - 400 K/uL   Neutrophils Relative % 59 %   Neutro Abs 3.9 1.7 - 7.7 K/uL   Lymphocytes Relative 35 %   Lymphs Abs 2.3 0.7 - 4.0 K/uL   Monocytes Relative 3 %   Monocytes Absolute 0.2 0.1 - 1.0 K/uL   Eosinophils Relative 2 %   Eosinophils Absolute 0.1 0.0 - 0.7 K/uL   Basophils Relative 1 %   Basophils Absolute 0.0 0.0 - 0.1 K/uL  hCG, quantitative, pregnancy     Status: Abnormal   Collection Time: 07/18/15 11:29 AM  Result Value Ref Range   hCG, Beta Chain, Quant, S 185 (H)  <5 mIU/mL   Blood type from previous record-A positive.   [place] Ob Comp Less 14 Wks  07/18/2015  CLINICAL DATA:  Abdominal pain for 2 weeks. Pain is in the left lower quadrant. Evaluate for an ectopic pregnancy. History of miscarriages. Gestational age by LMP is 4 weeks and 6 days. EXAM: OBSTETRIC <14 WK US AND TRANSVAGINAL OB US TECHNIQUE: Both transabdominal and transvaginal ultrasound examinations were  performed for complete evaluation of the gestation as well as the maternal uterus, adnexal regions, and pelvic cul-de-sac. Transvaginal technique was performed to assess early pregnancy. COMPARISON:  None. FINDINGS: Intrauterine gestational sac: None Maternal uterus/adnexae: Uterus measures 9.3 x 4.4 x 4.9 cm. Small irregular shaped fluid collection within the endometrial cavity near the fundus. Evidence for a small nabothian cyst. No definite intrauterine gestational sac. Echotexture in the uterus is grossly normal without large fibroids. Normal appearance of the right ovary measuring 3.5 x 2.2 x 2.4 cm. Evidence for a corpus luteal cyst in the right ovary. Normal appearance of the left ovary measuring 2.9 x 1.7 x 1.7 cm. No free fluid. Endometrium roughly measures 17 mm. No findings are suggestive for an ectopic pregnancy. IMPRESSION: Small irregular-shaped fluid collection near the uterine fundus. Findings are suspicious but not yet definitive for failed pregnancy. Recommend follow-up US in 10-14 days for definitive diagnosis. This recommendation follows SRU consensus guidelines: Diagnostic Criteria for Nonviable Pregnancy Early in the First Trimester. Malva Limes Engl J Med 2013; 914:7829-56; 369:1443-51. Electronically Signed   By: Richarda OverlieAdam  Henn M.D.   On: 07/18/2015 13:31   [place] Ob Transvaginal  07/18/2015  CLINICAL DATA:  Abdominal pain for 2 weeks. Pain is in the left lower quadrant. Evaluate for an ectopic pregnancy. History of miscarriages. Gestational age by LMP is 4 weeks and 6 days. EXAM: OBSTETRIC <14 WK US AND TRANSVAGINAL  OB US TECHNIQUE: Both transabdominal and transvaginal ultrasound examinations were performed for complete evaluation of the gestation as well as the maternal uterus, adnexal regions, and pelvic cul-de-sac. Transvaginal technique was performed to assess early pregnancy. COMPARISON:  None. FINDINGS: Intrauterine gestational sac: None Maternal uterus/adnexae: Uterus measures 9.3 x 4.4 x 4.9 cm. Small irregular shaped fluid collection within the endometrial cavity near the fundus. Evidence for a small nabothian cyst. No definite intrauterine gestational sac. Echotexture in the uterus is grossly normal without large fibroids. Normal appearance of the right ovary measuring 3.5 x 2.2 x 2.4 cm. Evidence for a corpus luteal cyst in the right ovary. Normal appearance of the left ovary measuring 2.9 x 1.7 x 1.7 cm. No free fluid. Endometrium roughly measures 17 mm. No findings are suggestive for an ectopic pregnancy. IMPRESSION: Small irregular-shaped fluid collection near the uterine fundus. Findings are suspicious but not yet definitive for failed pregnancy. Recommend follow-up US in 10-14 days for definitive diagnosis. This recommendation follows SRU consensus guidelines: Diagnostic Criteria for Nonviable Pregnancy Early in the First Trimester. Malva Limes Engl J Med 2013; 213:0865-78; 369:1443-51. Electronically Signed   By: Richarda OverlieAdam  Henn M.D.   On: 07/18/2015 13:31   MAU Course  Procedures  MDM MSE Labs Exam U/S Reviewed lab and U/S findings with the patient and reviewed plan of care   Assessment and Plan  A;  Left sided abdominal pain at 2437w5d gestation by LMP      Negative  Lab results      Hx of SAB X 2      Pregnancy of unknown location-suspicious for failed pregnancy      P: Return to MAU for repeat BHCG in 48 hrs     Return sooner if sxs persist     May take Tylenol for discomfort.      Ethne Jeon,EVE M 07/18/2015, 1:23 PM

## 2015-07-18 NOTE — Discharge Instructions (Signed)

## 2015-07-18 NOTE — MAU Note (Signed)
Abdominal pain x 2 weeks on left lower quadrant was sent for rule out ectopic pregnancy from [facility], no vaginal bleeding.

## 2015-07-18 NOTE — Assessment & Plan Note (Signed)
The patient is presenting with a positive pregnancy test with an L LMP dating her at 4 weeks 6 days. She is also endorsing left-sided abdominal pain with tenderness over the left adnexa on my exam.  She notes that her pain has been constant over the last 2 weeks. She denies any nausea, vomiting, or fevers with these symptoms. -Given the concerns for an ectopic pregnancy. Patient was advised to go to the MAU so that she may obtain a transvaginal ultrasound to confirm an intrauterine pregnancy. -We did discuss medications and food and pregnancy. Patient was advised to not smoke hookah anymore. She was advised to not start drinking alcohol. We discussed being evaluated in the future if she has new abdominal pain or vaginal bleeding during her pregnancy.

## 2015-07-19 LAB — HIV ANTIBODY (ROUTINE TESTING W REFLEX): HIV SCREEN 4TH GENERATION: NONREACTIVE

## 2015-07-20 ENCOUNTER — Inpatient Hospital Stay (HOSPITAL_COMMUNITY)
Admission: AD | Admit: 2015-07-20 | Discharge: 2015-07-20 | Disposition: A | Discharge status: Home / Self Care | Payer: Medicaid Other | Source: Ambulatory Visit | Attending: Obstetrics & Gynecology | Admitting: Obstetrics & Gynecology

## 2015-07-20 DIAGNOSIS — O26891 Other specified pregnancy related conditions, first trimester: Secondary | ICD-10-CM | POA: Diagnosis not present

## 2015-07-20 DIAGNOSIS — R1030 Lower abdominal pain, unspecified: Secondary | ICD-10-CM | POA: Diagnosis present

## 2015-07-20 DIAGNOSIS — O3680X0 Pregnancy with inconclusive fetal viability, not applicable or unspecified: Secondary | ICD-10-CM

## 2015-07-20 DIAGNOSIS — R109 Unspecified abdominal pain: Secondary | ICD-10-CM

## 2015-07-20 DIAGNOSIS — Z3A01 Less than 8 weeks gestation of pregnancy: Secondary | ICD-10-CM | POA: Insufficient documentation

## 2015-07-20 DIAGNOSIS — O9989 Other specified diseases and conditions complicating pregnancy, childbirth and the puerperium: Secondary | ICD-10-CM

## 2015-07-20 DIAGNOSIS — O24911 Unspecified diabetes mellitus in pregnancy, first trimester: Secondary | ICD-10-CM | POA: Insufficient documentation

## 2015-07-20 LAB — HCG, QUANTITATIVE, PREGNANCY: hCG, Beta Chain, Quant, S: 489 m[IU]/mL — ABNORMAL HIGH (ref <5)

## 2015-07-20 NOTE — MAU Provider Note (Signed)
Chief Complaint  Patient presents with  . Follow-up    Subjective:   Pt is a 30 y.o. [id number] here for follow-up BHCG.  Upon review of the records patient was first seen on 07-18-15 for lower abdominal pain.   BHCG on that day was 185.  Ultrasound showed no definite gestational sac.  Pt discharged home 07-18-15.   Pt here today with no report of any worsening abdominal pain or vaginal bleeding.   All other systems negative.  Last seen in MAU on 07-18-15.      BHCG was 185.    Past Medical History  Diagnosis Date  . Miscarriage 08/26/2010  . Diabetes mellitus without complication (HCC)   . SAB (spontaneous abortion) 2012    x 2 in 2012 - no surgery required  . Headache(784.0)     otc med prn    OB History  Gravida Para Term Preterm AB SAB TAB Ectopic Multiple Living  5 2 2  2 2    2     # Outcome Date GA Lbr Len/2nd Weight Sex Delivery Anes PTL Lv  5 Current           4 Term 05/18/13 557w0d  9 lb 7.9 oz (4.305 kg) M CS-LTranv Spinal  Y  3 Term 05/09/12 3557w6d  9 lb 1.3 oz (4.12 kg) M CS-Vac EPI  Y  2 SAB 07/15/10 6853w0d         1 SAB 04/14/10 2646w0d             Family History  Problem Relation Age of Onset  . Miscarriages / Stillbirths Sister   . Clotting disorder Sister   . Alcohol abuse Father   . Diabetes Father   . Hypertension Father   . Diabetes Maternal Grandfather     Objective: Physical Exam  Filed Vitals:   07/20/15 1220  BP: 123/69  Pulse: 82  Temp: 98.6 F (37 C)  Resp: 18   Constitutional: She is oriented to person, place, and time. She appears well-developed and well-nourished. No distress.  Pulmonary/Chest: Effort normal. No respiratory distress.  Musculoskeletal: Normal range of motion.  Neurological: She is alert and oriented to person, place, and time.  Skin: Skin is warm and dry.   Results for orders placed or performed during the hospital encounter of 07/20/15 (from the past 24 hour(s))  hCG, quantitative, pregnancy     Status: Abnormal   Collection  Time: 07/20/15 12:23 PM  Result Value Ref Range   hCG, Beta Chain, Quant, S 489 (H) <5 mIU/mL   Client left after blood draw as she had her other children in the car.  Is to return to review her results.  Awaiting return of client.  Assessment: 30 y.o. [id number] at 3542w1d wks  Pregnancy of unknown anatomic location Follow-up BHCG  Plan: Will plan followup outpatient ultrasound on 07-25-15. Client plans to be out of town on 07-25-15 but can schedule on 07-28-15. Continue Ectopic precautions.  Return sooner if having severe pain or bleeding.

## 2015-07-20 NOTE — MAU Note (Signed)
Patient not in lobby attempted to call her cell phone no answer and no voice mail set up.

## 2015-07-20 NOTE — MAU Note (Signed)
Still having left lower quadrant pain has not worsening, no vaginal bleeding.

## 2015-07-21 LAB — GC/CHLAMYDIA PROBE AMP (~~LOC~~) NOT AT ARMC
Chlamydia: NEGATIVE
NEISSERIA GONORRHEA: NEGATIVE

## 2015-07-30 ENCOUNTER — Ambulatory Visit (HOSPITAL_COMMUNITY)
Admission: RE | Admit: 2015-07-30 | Discharge: 2015-07-30 | Disposition: A | Discharge status: Home / Self Care | Payer: Medicaid Other | Source: Ambulatory Visit | Attending: Nurse Practitioner | Admitting: Nurse Practitioner

## 2015-07-30 ENCOUNTER — Encounter: Payer: Self-pay | Admitting: Advanced Practice Midwife

## 2015-07-30 ENCOUNTER — Ambulatory Visit (INDEPENDENT_AMBULATORY_CARE_PROVIDER_SITE_OTHER): Payer: Medicaid Other | Admitting: Advanced Practice Midwife

## 2015-07-30 DIAGNOSIS — Z36 Encounter for antenatal screening of mother: Secondary | ICD-10-CM | POA: Diagnosis present

## 2015-07-30 DIAGNOSIS — R109 Unspecified abdominal pain: Secondary | ICD-10-CM

## 2015-07-30 DIAGNOSIS — O9989 Other specified diseases and conditions complicating pregnancy, childbirth and the puerperium: Secondary | ICD-10-CM

## 2015-07-30 DIAGNOSIS — Z3A01 Less than 8 weeks gestation of pregnancy: Secondary | ICD-10-CM | POA: Insufficient documentation

## 2015-07-30 DIAGNOSIS — O3680X0 Pregnancy with inconclusive fetal viability, not applicable or unspecified: Secondary | ICD-10-CM

## 2015-07-30 DIAGNOSIS — O26899 Other specified pregnancy related conditions, unspecified trimester: Secondary | ICD-10-CM | POA: Insufficient documentation

## 2015-07-30 DIAGNOSIS — Z331 Pregnant state, incidental: Secondary | ICD-10-CM | POA: Diagnosis present

## 2015-07-30 NOTE — Patient Instructions (Signed)
First Trimester of Pregnancy The first trimester of pregnancy is from week 1 until the end of week 12 (months 1 through 3). A week after a sperm fertilizes an egg, the egg will implant on the wall of the uterus. This embryo will begin to develop into a baby. Genes from you and your partner are forming the baby. The female genes determine whether the baby is a boy or a girl. At 6-8 weeks, the eyes and face are formed, and the heartbeat can be seen on ultrasound. At the end of 12 weeks, all the baby's organs are formed.  Now that you are pregnant, you will want to do everything you can to have a healthy baby. Two of the most important things are to get good prenatal care and to follow your health care provider's instructions. Prenatal care is all the medical care you receive before the baby's birth. This care will help prevent, find, and treat any problems during the pregnancy and childbirth. BODY CHANGES Your body goes through many changes during pregnancy. The changes vary from woman to woman.   You may gain or lose a couple of pounds at first.  You may feel sick to your stomach (nauseous) and throw up (vomit). If the vomiting is uncontrollable, call your health care provider.  You may tire easily.  You may develop headaches that can be relieved by medicines approved by your health care provider.  You may urinate more often. Painful urination may mean you have a bladder infection.  You may develop heartburn as a result of your pregnancy.  You may develop constipation because certain hormones are causing the muscles that push waste through your intestines to slow down.  You may develop hemorrhoids or swollen, bulging veins (varicose veins).  Your breasts may begin to grow larger and become tender. Your nipples may stick out more, and the tissue that surrounds them (areola) may become darker.  Your gums may bleed and may be sensitive to brushing and flossing.  Dark spots or blotches (chloasma,  mask of pregnancy) may develop on your face. This will likely fade after the baby is born.  Your menstrual periods will stop.  You may have a loss of appetite.  You may develop cravings for certain kinds of food.  You may have changes in your emotions from day to day, such as being excited to be pregnant or being concerned that something may go wrong with the pregnancy and baby.  You may have more vivid and strange dreams.  You may have changes in your hair. These can include thickening of your hair, rapid growth, and changes in texture. Some women also have hair loss during or after pregnancy, or hair that feels dry or thin. Your hair will most likely return to normal after your baby is born. WHAT TO EXPECT AT YOUR PRENATAL VISITS During a routine prenatal visit:  You will be weighed to make sure you and the baby are growing normally.  Your blood pressure will be taken.  Your abdomen will be measured to track your baby's growth.  The fetal heartbeat will be listened to starting around week 10 or 12 of your pregnancy.  Test results from any previous visits will be discussed. Your health care provider may ask you:  How you are feeling.  If you are feeling the baby move.  If you have had any abnormal symptoms, such as leaking fluid, bleeding, severe headaches, or abdominal cramping.  If you are using any tobacco products,   including cigarettes, chewing tobacco, and electronic cigarettes.  If you have any questions. Other tests that may be performed during your first trimester include:  Blood tests to find your blood type and to check for the presence of any previous infections. They will also be used to check for low iron levels (anemia) and Rh antibodies. Later in the pregnancy, blood tests for diabetes will be done along with other tests if problems develop.  Urine tests to check for infections, diabetes, or protein in the urine.  An ultrasound to confirm the proper growth  and development of the baby.  An amniocentesis to check for possible genetic problems.  Fetal screens for spina bifida and Down syndrome.  You may need other tests to make sure you and the baby are doing well.  HIV (human immunodeficiency virus) testing. Routine prenatal testing includes screening for HIV, unless you choose not to have this test. HOME CARE INSTRUCTIONS  Medicines  Follow your health care provider's instructions regarding medicine use. Specific medicines may be either safe or unsafe to take during pregnancy.  Take your prenatal vitamins as directed.  If you develop constipation, try taking a stool softener if your health care provider approves. Diet  Eat regular, well-balanced meals. Choose a variety of foods, such as meat or vegetable-based protein, fish, milk and low-fat dairy products, vegetables, fruits, and whole grain breads and cereals. Your health care provider will help you determine the amount of weight gain that is right for you.  Avoid raw meat and uncooked cheese. These carry germs that can cause birth defects in the baby.  Eating four or five small meals rather than three large meals a day may help relieve nausea and vomiting. If you start to feel nauseous, eating a few soda crackers can be helpful. Drinking liquids between meals instead of during meals also seems to help nausea and vomiting.  If you develop constipation, eat more high-fiber foods, such as fresh vegetables or fruit and whole grains. Drink enough fluids to keep your urine clear or pale yellow. Activity and Exercise  Exercise only as directed by your health care provider. Exercising will help you:  Control your weight.  Stay in shape.  Be prepared for labor and delivery.  Experiencing pain or cramping in the lower abdomen or low back is a good sign that you should stop exercising. Check with your health care provider before continuing normal exercises.  Try to avoid standing for long  periods of time. Move your legs often if you must stand in one place for a long time.  Avoid heavy lifting.  Wear low-heeled shoes, and practice good posture.  You may continue to have sex unless your health care provider directs you otherwise. Relief of Pain or Discomfort  Wear a good support bra for breast tenderness.   Take warm sitz baths to soothe any pain or discomfort caused by hemorrhoids. Use hemorrhoid cream if your health care provider approves.   Rest with your legs elevated if you have leg cramps or low back pain.  If you develop varicose veins in your legs, wear support hose. Elevate your feet for 15 minutes, 3-4 times a day. Limit salt in your diet. Prenatal Care  Schedule your prenatal visits by the twelfth week of pregnancy. They are usually scheduled monthly at first, then more often in the last 2 months before delivery.  Write down your questions. Take them to your prenatal visits.  Keep all your prenatal visits as directed by your   health care provider. Safety  Wear your seat belt at all times when driving.  Make a list of emergency phone numbers, including numbers for family, friends, the hospital, and police and fire departments. General Tips  Ask your health care provider for a referral to a local prenatal education class. Begin classes no later than at the beginning of month 6 of your pregnancy.  Ask for help if you have counseling or nutritional needs during pregnancy. Your health care provider can offer advice or refer you to specialists for help with various needs.  Do not use hot tubs, steam rooms, or saunas.  Do not douche or use tampons or scented sanitary pads.  Do not cross your legs for long periods of time.  Avoid cat litter boxes and soil used by cats. These carry germs that can cause birth defects in the baby and possibly loss of the fetus by miscarriage or stillbirth.  Avoid all smoking, herbs, alcohol, and medicines not prescribed by  your health care provider. Chemicals in these affect the formation and growth of the baby.  Do not use any tobacco products, including cigarettes, chewing tobacco, and electronic cigarettes. If you need help quitting, ask your health care provider. You may receive counseling support and other resources to help you quit.  Schedule a dentist appointment. At home, brush your teeth with a soft toothbrush and be gentle when you floss. SEEK MEDICAL CARE IF:   You have dizziness.  You have mild pelvic cramps, pelvic pressure, or nagging pain in the abdominal area.  You have persistent nausea, vomiting, or diarrhea.  You have a bad smelling vaginal discharge.  You have pain with urination.  You notice increased swelling in your face, hands, legs, or ankles. SEEK IMMEDIATE MEDICAL CARE IF:   You have a fever.  You are leaking fluid from your vagina.  You have spotting or bleeding from your vagina.  You have severe abdominal cramping or pain.  You have rapid weight gain or loss.  You vomit blood or material that looks like coffee grounds.  You are exposed to German measles and have never had them.  You are exposed to fifth disease or chickenpox.  You develop a severe headache.  You have shortness of breath.  You have any kind of trauma, such as from a fall or a car accident.   This information is not intended to replace advice given to you by your health care provider. Make sure you discuss any questions you have with your health care provider.   Document Released: 01/05/2001 Document Revised: 02/01/2014 Document Reviewed: 11/21/2012 Elsevier Interactive Patient Education 2016 Elsevier Inc.  

## 2015-07-30 NOTE — Progress Notes (Signed)
Ultrasounds Results Note  SUBJECTIVE HPI:  [name] is a 30 y.o. [id number] at 6669w4d by LMP who [facility] for followup ultrasound results. The patient denies abdominal pain or vaginal bleeding.  Upon review of the patient's records, patient was first seen in [facility]U on 07/18/15 for abdominal pain.   BHCG on that day was 185.  Ultrasound showed nothing in uterus.  Last seen in MAU on 6/25./17. BHCG was 489.  Repeat ultrasound was performed earlier today.   Past Medical History  Diagnosis Date  . Miscarriage 08/26/2010  . Diabetes mellitus without complication (HCC)   . SAB (spontaneous abortion) 2012    x 2 in 2012 - no surgery required  . Headache(784.0)     otc med prn   Past Surgical History  Procedure Laterality Date  . Cesarean section N/A 05/09/2012    Procedure: Primary CESAREAN SECTION of baby boy  at 0435 APGAR 9/9;  Surgeon: Lavina Hammanodd Meisinger, MD;  Location: [facility];  Service: Obstetrics;  Laterality: N/A;  . Tonsillectomy    . Cesarean section N/A 05/18/2013    Procedure: REPEAT CESAREAN SECTION;  Surgeon: Oliver PilaKathy W Richardson, MD;  Location: [facility];  Service: Obstetrics;  Laterality: N/A;   Social History   Social History  . Marital Status: Married    Spouse Name: N/A  . Number of Children: N/A  . Years of Education: N/A   Occupational History  . Not on file.   Social History Main Topics  . Smoking status: Never Smoker   . Smokeless tobacco: Former NeurosurgeonUser    Quit date: 08/23/2012     Comment: Hookah 2-3 times per week  . Alcohol Use: No  . Drug Use: No  . Sexual Activity: Yes    Birth Control/ Protection: None   Other Topics Concern  . 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  hydrocortisone (ANUSOL-HC) 2.5 % rectal cream Apply rectally 2 times daily (Patient not taking: Reported on 07/18/2015) 28.35 g 0  . ranitidine (ZANTAC) 150 MG tablet Take 1 tablet (150 mg total) by mouth 2 (two) times daily. (Patient not taking: Reported on 07/18/2015) 60 tablet 0   No current facility-administered medications on file prior to visit.   No Known Allergies  I have reviewed patient's Past Medical Hx, Surgical Hx, Family Hx, Social Hx, medications and allergies.   Review of Systems   Constitutional: Negative for fever and chills.  Gastrointestinal: Negative for nausea, vomiting, abdominal pain, diarrhea and constipation.  Genitourinary: Negative for dysuria.  Musculoskeletal: Negative for back pain.  Neurological: Negative for dizziness and weakness.    Physical Exam  LMP 06/14/2015 (Exact Date)  GENERAL: Well-developed, well-nourished female in no acute distress.  HEENT: Normocephalic, atraumatic.   LUNGS: Effort normal ABDOMEN: soft, non-tender HEART: Regular rate  SKIN: Warm, dry and without erythema PSYCH: Normal mood and affect NEURO: Alert and oriented x 4  LAB RESULTS No results found for this or any previous visit (from the past 24 hour(s)).  IMAGING [place] Ob Comp Less 14 Wks  07/18/2015  CLINICAL DATA:  Abdominal pain for 2 weeks. Pain is in the left lower quadrant. Evaluate for an ectopic  pregnancy. History of miscarriages. Gestational age by LMP is 4 weeks and 6 days. EXAM: OBSTETRIC <14 WK [place] AND TRANSVAGINAL OB US TECHNIQUE: Both transabdominal and transvaginal ultrasound examinations were performed for complete evaluation of the gestation as well as the maternal uterus, adnexal regions, and pelvic cul-de-sac. Transvaginal technique was performed to assess early pregnancy. COMPARISON:  None. FINDINGS: Intrauterine gestational sac: None Maternal uterus/adnexae: Uterus measures 9.3 x 4.4 x 4.9 cm. Small irregular shaped fluid collection within the endometrial  cavity near the fundus. Evidence for a small nabothian cyst. No definite intrauterine gestational sac. Echotexture in the uterus is grossly normal without large fibroids. Normal appearance of the right ovary measuring 3.5 x 2.2 x 2.4 cm. Evidence for a corpus luteal cyst in the right ovary. Normal appearance of the left ovary measuring 2.9 x 1.7 x 1.7 cm. No free fluid. Endometrium roughly measures 17 mm. No findings are suggestive for an ectopic pregnancy. IMPRESSION: Small irregular-shaped fluid collection near the uterine fundus. Findings are suspicious but not yet definitive for failed pregnancy. Recommend follow-up US in 10-14 days for definitive diagnosis. This recommendation follows SRU consensus guidelines: Diagnostic Criteria for Nonviable Pregnancy Early in the First Trimester. Malva Limes Med 2013; 841:3244-01. Electronically Signed   By: Richarda Overlie M.D.   On: 07/18/2015 13:31   US Ob Transvaginal  07/30/2015  CLINICAL DATA:  Pregnancy of unknown location, for viability EXAM: OBSTETRIC <14 WK ULTRASOUND TECHNIQUE: Transabdominal ultrasound was performed for evaluation of the gestation as well as the maternal uterus and adnexal regions. COMPARISON:  07/18/2015 FINDINGS: Intrauterine gestational sac: Single Yolk sac:  Present Embryo:  Not visualized MSD: 12  mm   6 w   0  d Subchorionic hemorrhage:  None visualized. Maternal uterus/adnexae: Bilateral ovaries are within normal limits, noting a right corpus luteal cyst. No free fluid. IMPRESSION: Single intrauterine gestational sac with yolk sac, measuring 6 weeks 0 days by mean sac diameter. No fetal pole is visualized. Consider follow-up pelvic ultrasound in 14 days to confirm viability as clinically warranted. Electronically Signed   By: Charline Bills M.D.   On: 07/30/2015 10:09   US Ob Transvaginal  07/18/2015  CLINICAL DATA:  Abdominal pain for 2 weeks. Pain is in the left lower quadrant. Evaluate for an ectopic pregnancy. History of miscarriages.  Gestational age by LMP is 4 weeks and 6 days. EXAM: OBSTETRIC <14 WK [place] AND TRANSVAGINAL OB US TECHNIQUE: Both transabdominal and transvaginal ultrasound examinations were performed for complete evaluation of the gestation as well as the maternal uterus, adnexal regions, and pelvic cul-de-sac. Transvaginal technique was performed to assess early pregnancy. COMPARISON:  None. FINDINGS: Intrauterine gestational sac: None Maternal uterus/adnexae: Uterus measures 9.3 x 4.4 x 4.9 cm. Small irregular shaped fluid collection within the endometrial cavity near the fundus. Evidence for a small nabothian cyst. No definite intrauterine gestational sac. Echotexture in the uterus is grossly normal without large fibroids. Normal appearance of the right ovary measuring 3.5 x 2.2 x 2.4 cm. Evidence for a corpus luteal cyst in the right ovary. Normal appearance of the left ovary measuring 2.9 x 1.7 x 1.7 cm. No free fluid. Endometrium roughly measures 17 mm. No findings are suggestive for an ectopic pregnancy. IMPRESSION: Small irregular-shaped fluid collection near the uterine fundus. Findings are suspicious but not yet definitive for failed pregnancy. Recommend follow-up US in 10-14 days for definitive diagnosis. This recommendation follows SRU consensus guidelines: Diagnostic Criteria for Nonviable Pregnancy Early in the First Trimester. N Engl  J Med 2013; 657:8469-62; 369:1443-51. Electronically Signed   By: Richarda OverlieAdam  Henn M.D.   On: 07/18/2015 13:31    ASSESSMENT 1. Abdominal pain affecting pregnancy, antepartum     PLAN Discharge home in stable condition Patient advised to start/continue taking prenatal vitamins DIscussed US findings. Discussed it might be a normal pregnancy that just has a very small fetus, or it could be a abnormal pregnancy  Patient advised to [facility] as possible-- plans care with Dr Senaida Oresichardson.   Patient to follow up with them according to their plan for US in 2-4  weeks.  Aviva SignsMarie L Williams, CNM  07/30/2015  11:15 AM

## 2015-08-11 ENCOUNTER — Encounter (HOSPITAL_COMMUNITY): Payer: Self-pay | Admitting: *Deleted

## 2015-08-11 ENCOUNTER — Inpatient Hospital Stay (HOSPITAL_COMMUNITY)
Admission: AD | Admit: 2015-08-11 | Discharge: 2015-08-11 | Disposition: A | Discharge status: Home / Self Care | Payer: Medicaid Other | Source: Ambulatory Visit | Attending: Obstetrics and Gynecology | Admitting: Obstetrics and Gynecology

## 2015-08-11 ENCOUNTER — Inpatient Hospital Stay (HOSPITAL_COMMUNITY): Payer: Medicaid Other

## 2015-08-11 DIAGNOSIS — K219 Gastro-esophageal reflux disease without esophagitis: Secondary | ICD-10-CM

## 2015-08-11 DIAGNOSIS — R109 Unspecified abdominal pain: Secondary | ICD-10-CM

## 2015-08-11 DIAGNOSIS — O2341 Unspecified infection of urinary tract in pregnancy, first trimester: Secondary | ICD-10-CM

## 2015-08-11 DIAGNOSIS — O26899 Other specified pregnancy related conditions, unspecified trimester: Secondary | ICD-10-CM

## 2015-08-11 DIAGNOSIS — O26891 Other specified pregnancy related conditions, first trimester: Secondary | ICD-10-CM | POA: Diagnosis not present

## 2015-08-11 DIAGNOSIS — Z3A08 8 weeks gestation of pregnancy: Secondary | ICD-10-CM | POA: Insufficient documentation

## 2015-08-11 DIAGNOSIS — N390 Urinary tract infection, site not specified: Secondary | ICD-10-CM

## 2015-08-11 DIAGNOSIS — O24911 Unspecified diabetes mellitus in pregnancy, first trimester: Secondary | ICD-10-CM | POA: Diagnosis not present

## 2015-08-11 DIAGNOSIS — O9989 Other specified diseases and conditions complicating pregnancy, childbirth and the puerperium: Secondary | ICD-10-CM

## 2015-08-11 DIAGNOSIS — O9962 Diseases of the digestive system complicating childbirth: Secondary | ICD-10-CM | POA: Insufficient documentation

## 2015-08-11 LAB — URINALYSIS, ROUTINE W REFLEX MICROSCOPIC
Bilirubin Urine: NEGATIVE
GLUCOSE, UA: NEGATIVE mg/dL
Ketones, ur: NEGATIVE mg/dL
Nitrite: NEGATIVE
PH: 6 (ref 5.0–8.0)
Protein, ur: NEGATIVE mg/dL
SPECIFIC GRAVITY, URINE: 1.015 (ref 1.005–1.030)

## 2015-08-11 LAB — URINE MICROSCOPIC-ADD ON

## 2015-08-11 MED ORDER — RANITIDINE HCL 150 MG PO TABS: 150.0000 mg | ORAL_TABLET | Freq: Two times a day (BID) | ORAL | Status: DC

## 2015-08-11 MED ORDER — CEPHALEXIN 500 MG PO CAPS: 500.0000 mg | ORAL_CAPSULE | Freq: Four times a day (QID) | ORAL | Status: DC

## 2015-08-11 NOTE — MAU Note (Signed)
Has pain in lower abd, sometimes on left side.  This morning  It was a  Lot of pain.

## 2015-08-11 NOTE — MAU Provider Note (Signed)
History     CSN: [id number]  Arrival date and time: 08/11/15 1655   First Provider Initiated Contact with Patient 08/11/15 1754      Chief Complaint  Patient presents with  . Abdominal Pain   HPI    [name] is a 30 y.o. female with a history of GERD, [id number] @ 3760w2d here with abdominal pain that started this morning. The pain comes and goes, the pain is sharp at times. She denies vaginal bleeding. She currently rates her pain 5/10. The pain is worse in the LLQ. She has not taken anything for the pain, she denies the need for pain medication at this time.   She has been seen in MAU with this pregnancy with her initial visit to MAU due to abdominal pain. Her last US showed a yolk sac only. She is worried.    OB History    Gravida Para Term Preterm AB TAB SAB Ectopic Multiple Living   5 2 2  2  2   2       Past Medical History  Diagnosis Date  . Miscarriage 08/26/2010  . Diabetes mellitus without complication (HCC)   . SAB (spontaneous abortion) 2012    x 2 in 2012 - no surgery required  . Headache(784.0)     otc med prn    Past Surgical History  Procedure Laterality Date  . Cesarean section N/A 05/09/2012    Procedure: Primary CESAREAN SECTION of baby boy  at 0435 APGAR 9/9;  Surgeon: Lavina Hammanodd Meisinger, MD;  Location: [facility];  Service: Obstetrics;  Laterality: N/A;  . Tonsillectomy    . Cesarean section N/A 05/18/2013    Procedure: REPEAT CESAREAN SECTION;  Surgeon: Oliver PilaKathy W Richardson, MD;  Location: [facility];  Service: Obstetrics;  Laterality: N/A;    Family History  Problem Relation Age of Onset  . Miscarriages / Stillbirths Sister   . Clotting disorder Sister   . Alcohol abuse Father   . Diabetes Father   . Hypertension Father   . Diabetes Maternal Grandfather     Social History  Substance Use Topics  . Smoking status: Never Smoker   . Smokeless tobacco: Former NeurosurgeonUser    Quit date: 08/23/2012     Comment: Hookah 2-3 times per week  . Alcohol Use: No     Allergies: No Known Allergies  Prescriptions prior to admission  Medication Sig Dispense Refill Last Dose  . Prenatal Vit-Fe Fumarate-FA (PRENATAL MULTIVITAMIN) TABS tablet Take 1 tablet by mouth daily at 12 noon.   08/11/2015 at Unknown time  . hydrocortisone (ANUSOL-HC) 2.5 % rectal cream Apply rectally 2 times daily (Patient not taking: Reported on 07/18/2015) 28.35 g 0 Not Taking at Unknown time   Results for orders placed or performed during the hospital encounter of 08/11/15 (from the past 48 hour(s))  Urinalysis, Routine w reflex microscopic (not at [facility])     Status: Abnormal   Collection Time: 08/11/15  6:05 PM  Result Value Ref Range   Color, Urine YELLOW YELLOW   APPearance CLEAR CLEAR   Specific Gravity, Urine 1.015 1.005 - 1.030   pH 6.0 5.0 - 8.0   Glucose, UA NEGATIVE NEGATIVE mg/dL   Hgb urine dipstick TRACE (A) NEGATIVE   Bilirubin Urine NEGATIVE NEGATIVE   Ketones, ur NEGATIVE NEGATIVE mg/dL   Protein, ur NEGATIVE NEGATIVE mg/dL   Nitrite NEGATIVE NEGATIVE   Leukocytes, UA MODERATE (A) NEGATIVE  Urine microscopic-add on     Status: Abnormal  Collection Time: 08/11/15  6:05 PM  Result Value Ref Range   Squamous Epithelial / LPF 0-5 (A) NONE SEEN   WBC, UA 6-30 0 - 5 WBC/hpf   RBC / HPF 0-5 0 - 5 RBC/hpf   Bacteria, UA RARE (A) NONE SEEN   US Ob Transvaginal  08/11/2015  CLINICAL DATA:  Pregnant, left lower quadrant pain EXAM: TRANSVAGINAL OB ULTRASOUND TECHNIQUE: Transvaginal ultrasound was performed for complete evaluation of the gestation as well as the maternal uterus, adnexal regions, and pelvic cul-de-sac. COMPARISON:  07/30/2015 FINDINGS: Intrauterine gestational sac: Single Yolk sac:  Present Embryo:  Present Cardiac Activity: Present Heart Rate: 166 bpm CRL:   13.6  mm   7 w 5 d                  [place] EDC: 03/24/2016 Subchorionic hemorrhage:  Small subchronic hemorrhage. Maternal uterus/adnexae: Bilateral ovaries are within normal limits, noting a probable  right corpus luteal cyst. No free fluid. IMPRESSION: Single live intrauterine gestation, with estimated gestational age [redacted] weeks 5 days by crown-rump length. Electronically Signed   By: Charline Bills M.D.   On: 08/11/2015 19:04    Review of Systems  Constitutional: Negative for fever and chills.  Gastrointestinal: Positive for heartburn, nausea, vomiting (Occasional Vomiting ) and abdominal pain. Negative for diarrhea and constipation.  Genitourinary: Positive for dysuria, urgency and frequency.   Physical Exam   Blood pressure 115/64, pulse 84, temperature 98.6 F (37 C), temperature source Oral, resp. rate 20, weight 214 lb (97.07 kg), last menstrual period 06/14/2015, SpO2 100 %.  Physical Exam  Constitutional: She is oriented to person, place, and time. She appears well-developed and well-nourished.  Non-toxic appearance. She does not have a sickly appearance. She does not appear ill. No distress.  HENT:  Head: Normocephalic.  Eyes: Pupils are equal, round, and reactive to light.  Respiratory: Effort normal.  GI: Soft. Normal appearance. There is tenderness in the epigastric area and suprapubic area. There is no rigidity, no guarding and no CVA tenderness.  Musculoskeletal: Normal range of motion.  Neurological: She is alert and oriented to person, place, and time.  Skin: Skin is warm. She is not diaphoretic.  Psychiatric: Her behavior is normal.    MAU Course  Procedures  None  MDM  Urine culture pending.  [place] Discussed findings with Dr. Jackelyn Knife.    Assessment and Plan   A:  1. UTI (lower urinary tract infection)   2. Abdominal pain in pregnancy, antepartum   3. Gastroesophageal reflux disease, esophagitis presence not specified     P:  Discharge home in stable condition Rx: Zantac, Keflex Return precautions discussed Follow up with OB as scheduled First trimester warning signs Small, frequent meals.  GERD diet discussed   Duane Lope,  NP 08/11/2015 8:04 PM

## 2015-08-11 NOTE — Discharge Instructions (Signed)
Pregnancy and Urinary Tract Infection °A urinary tract infection (UTI) is a bacterial infection of the urinary tract. Infection of the urinary tract can include the ureters, kidneys (pyelonephritis), bladder (cystitis), and urethra (urethritis). All pregnant women should be screened for bacteria in the urinary tract. Identifying and treating a UTI will decrease the risk of preterm labor and developing more serious infections in both the mother and baby. °CAUSES °Bacteria germs cause almost all UTIs.  °RISK FACTORS °Many factors can increase your chances of getting a UTI during pregnancy. These include: °· Having a short urethra. °· Poor toilet and hygiene habits. °· Sexual intercourse. °· Blockage of urine along the urinary tract. °· Problems with the pelvic muscles or nerves. °· Diabetes. °· Obesity. °· Bladder problems after having several children. °· Previous history of UTI. °SIGNS AND SYMPTOMS  °· Pain, burning, or a stinging feeling when urinating. °· Suddenly feeling the need to urinate right away (urgency). °· Loss of bladder control (urinary incontinence). °· Frequent urination, more than is common with pregnancy. °· Lower abdominal or back discomfort. °· Cloudy urine. °· Blood in the urine (hematuria). °· Fever.  °When the kidneys are infected, the symptoms may be: °· Back pain. °· Flank pain on the right side more so than the left. °· Fever. °· Chills. °· Nausea. °· Vomiting. °DIAGNOSIS  °A urinary tract infection is usually diagnosed through urine tests. Additional tests and procedures are sometimes done. These may include: °· Ultrasound exam of the kidneys, ureters, bladder, and urethra. °· Looking in the bladder with a lighted tube (cystoscopy). °TREATMENT °Typically, UTIs can be treated with antibiotic medicines.  °HOME CARE INSTRUCTIONS  °· Only take over-the-counter or prescription medicines as directed by your health care provider. If you were prescribed antibiotics, take them as directed. Finish  them even if you start to feel better. °· Drink enough fluids to keep your urine clear or pale yellow. °· Do not have sexual intercourse until the infection is gone and your health care provider says it is okay. °· Make sure you are tested for UTIs throughout your pregnancy. These infections often come back.  °Preventing a UTI in the Future °· Practice good toilet habits. Always wipe from front to back. Use the tissue only once. °· Do not hold your urine. Empty your bladder as soon as possible when the urge comes. °· Do not douche or use deodorant sprays. °· Wash with soap and warm water around the genital area and the anus. °· Empty your bladder before and after sexual intercourse. °· Wear underwear with a cotton crotch. °· Avoid caffeine and carbonated drinks. They can irritate the bladder. °· Drink cranberry juice or take cranberry pills. This may decrease the risk of getting a UTI. °· Do not drink alcohol. °· Keep all your appointments and tests as scheduled.  °SEEK MEDICAL CARE IF:  °· Your symptoms get worse. °· You are still having fevers 2 or more days after treatment begins. °· You have a rash. °· You feel that you are having problems with medicines prescribed. °· You have abnormal vaginal discharge. °SEEK IMMEDIATE MEDICAL CARE IF:  °· You have back or flank pain. °· You have chills. °· You have blood in your urine. °· You have nausea and vomiting. °· You have contractions of your uterus. °· You have a gush of fluid from the vagina. °MAKE SURE YOU: °· Understand these instructions.   °· Will watch your condition.   °· Will get help right away if you are not doing   well or get worse.    This information is not intended to replace advice given to you by your health care provider. Make sure you discuss any questions you have with your health care provider.   Document Released: 05/08/2010 Document Revised: 11/01/2012 Document Reviewed: 08/10/2012 Elsevier Interactive Patient Education 2016 Elsevier  Inc. Subchorionic Hematoma A subchorionic hematoma is a gathering of blood between the outer wall of the placenta and the inner wall of the womb (uterus). The placenta is the organ that connects the fetus to the wall of the uterus. The placenta performs the feeding, breathing (oxygen to the fetus), and waste removal (excretory work) of the fetus.  Subchorionic hematoma is the most common abnormality found on a result from ultrasonography done during the first trimester or early second trimester of pregnancy. If there has been little or no vaginal bleeding, early small hematomas usually shrink on their own and do not affect your baby or pregnancy. The blood is gradually absorbed over 1-2 weeks. When bleeding starts later in pregnancy or the hematoma is larger or occurs in an older pregnant woman, the outcome may not be as good. Larger hematomas may get bigger, which increases the chances for miscarriage. Subchorionic hematoma also increases the risk of premature detachment of the placenta from the uterus, preterm (premature) labor, and stillbirth. HOME CARE INSTRUCTIONS  Stay on bed rest if your health care provider recommends this. Although bed rest will not prevent more bleeding or prevent a miscarriage, your health care provider may recommend bed rest until you are advised otherwise.  Avoid heavy lifting (more than 10 lb [4.5 kg]), exercise, sexual intercourse, or douching as directed by your health care provider.  Keep track of the number of pads you use each day and how soaked (saturated) they are. Write down this information.  Do not use tampons.  Keep all follow-up appointments as directed by your health care provider. Your health care provider may ask you to have follow-up blood tests or ultrasound tests or both. SEEK IMMEDIATE MEDICAL CARE IF:  You have severe cramps in your stomach, back, abdomen, or pelvis.  You have a fever.  You pass large clots or tissue. Save any tissue for your  health care provider to look at.  Your bleeding increases or you become lightheaded, feel weak, or have fainting episodes.   This information is not intended to replace advice given to you by your health care provider. Make sure you discuss any questions you have with your health care provider.   Document Released: 04/28/2006 Document Revised: 02/01/2014 Document Reviewed: 08/10/2012 Elsevier Interactive Patient Education [employer]2016 Elsevier Inc.

## 2015-08-13 LAB — CULTURE, OB URINE: SPECIAL REQUESTS: NORMAL

## 2015-08-19 ENCOUNTER — Encounter: Payer: Self-pay | Admitting: Obstetrics and Gynecology

## 2015-08-19 ENCOUNTER — Ambulatory Visit (INDEPENDENT_AMBULATORY_CARE_PROVIDER_SITE_OTHER): Payer: Medicaid Other | Admitting: Obstetrics and Gynecology

## 2015-08-19 VITALS — BP 103/52 | HR 72 | Temp 98.7°F | Wt 212.0 lb

## 2015-08-19 DIAGNOSIS — J0100 Acute maxillary sinusitis, unspecified: Secondary | ICD-10-CM

## 2015-08-19 MED ORDER — SALINE SPRAY 0.65 % NA SOLN: 1.0000 | NASAL | 0 refills | Status: DC | PRN

## 2015-08-19 NOTE — Progress Notes (Signed)
   Subjective:   Patient ID: Marissa Carpenter, female    DOB: 05-Jun-1985, 30 y.o.   MRN: [id number]  Patient presents for Same Day Appointment. Video interpretor Asmaa [id number] used.  Chief Complaint  Patient presents with  . Sinus Problem    HPI: #Sinus pressure Started about one week ago left-sided face pressure Has associated sore throat Medications tried: none Progression: the same Strep throat exposure: unknown No sick contacts  Able to eat and drink adequately  States that she was recently seen in ED and given Abx for UTI  Having green mucous from her nose  Symptoms Fever: yes, yesterday 100.5 Cough: dry cough for about a week Runny nose: yes Drooling: no Sinus pain: yes Headache: yes Sneezing: yes  Significant medical history - patient is [redacted] wks pregnant Never Smoker  Review of Systems   See HPI for ROS.   Past medical history, surgical, family, and social history reviewed and updated in the EMR as appropriate.  Objective:  BP (!) 103/52   Pulse 72   Temp 98.7 F (37.1 C) (Oral)   Wt 212 lb (96.2 kg)   LMP 06/14/2015 (Exact Date)   SpO2 100%   BMI 36.39 kg/m  Vitals and nursing note reviewed  Physical Exam  Constitutional: She is well-developed, well-nourished, and in no distress.  HENT:  Nose: Mucosal edema present. Left sinus exhibits maxillary sinus tenderness.  Mouth/Throat: Oropharynx is clear and moist.  Neck: Normal range of motion. Neck supple.  Cardiovascular: Normal rate and regular rhythm.   Pulmonary/Chest: Effort normal and breath sounds normal.  Lymphadenopathy:    She has no cervical adenopathy.    Assessment & Plan:  1. Acute maxillary sinusitis, recurrence not specified Symptoms and exam consistent with acute sinusitis. Patient is well-appearing and afebrile. Most likely viral in etiology but cannot rule out bacterial infection. Currently on Keflex for UTI. Will not prescribe additional Abx at this time as Keflex does cover Strep.  Conservative treatment at this time to include nasal saline spray. Return precautions discussed. Handout given.     Caryl Ada, DO 08/19/2015, 2:01 PM PGY-3, [facility]

## 2015-08-19 NOTE — Patient Instructions (Addendum)
   Nasal saline prescribed to wash out sinuses.   Take tylenol for fever or pain  Keflex will hopefully treat your sinus infection too if it is bacterial.  Return to clinic if symptoms worsen or are not improved after completing antibiotics  Sinusitis, Adult Sinusitis is redness, soreness, and puffiness (inflammation) of the air pockets in the bones of your face (sinuses). The redness, soreness, and puffiness can cause air and mucus to get trapped in your sinuses. This can allow germs to grow and cause an infection.  HOME CARE   Drink enough fluids to keep your pee (urine) clear or pale yellow.  Use a humidifier in your home.  Run a hot shower to create steam in the bathroom. Sit in the bathroom with the door closed. Breathe in the steam 3-4 times a day.  Put a warm, moist washcloth on your face 3-4 times a day, or as told by your doctor.  Use salt water sprays (saline sprays) to wet the thick fluid in your nose. This can help the sinuses drain.  Only take medicine as told by your doctor. GET HELP RIGHT AWAY IF:   Your pain gets worse.  You have very bad headaches.  You are sick to your stomach (nauseous).  You throw up (vomit).  You are very sleepy (drowsy) all the time.  Your face is puffy (swollen).  Your vision changes.  You have a stiff neck.  You have trouble breathing. MAKE SURE YOU:   Understand these instructions.  Will watch your condition.  Will get help right away if you are not doing well or get worse.   This information is not intended to replace advice given to you by your health care provider. Make sure you discuss any questions you have with your health care provider.   Document Released: 06/30/2007 Document Revised: 02/01/2014 Document Reviewed: 08/17/2011 Elsevier Interactive Patient Education [employer]

## 2015-08-21 LAB — OB RESULTS CONSOLE HEPATITIS B SURFACE ANTIGEN: Hepatitis B Surface Ag: NEGATIVE

## 2015-08-21 LAB — OB RESULTS CONSOLE ABO/RH: RH Type: POSITIVE

## 2015-08-21 LAB — OB RESULTS CONSOLE GC/CHLAMYDIA
CHLAMYDIA, DNA PROBE: NEGATIVE
Gonorrhea: NEGATIVE

## 2015-08-21 LAB — OB RESULTS CONSOLE HIV ANTIBODY (ROUTINE TESTING): HIV: NONREACTIVE

## 2015-08-21 LAB — OB RESULTS CONSOLE ANTIBODY SCREEN: Antibody Screen: NEGATIVE

## 2015-08-21 LAB — OB RESULTS CONSOLE RUBELLA ANTIBODY, IGM: Rubella: IMMUNE

## 2015-08-21 LAB — OB RESULTS CONSOLE RPR: RPR: NONREACTIVE

## 2015-09-11 ENCOUNTER — Encounter: Payer: Self-pay | Admitting: Obstetrics and Gynecology

## 2015-09-11 ENCOUNTER — Ambulatory Visit (INDEPENDENT_AMBULATORY_CARE_PROVIDER_SITE_OTHER): Payer: Medicaid Other | Admitting: Obstetrics and Gynecology

## 2015-09-11 VITALS — BP 107/60 | HR 75 | Temp 98.5°F | Wt 214.0 lb

## 2015-09-11 DIAGNOSIS — J0100 Acute maxillary sinusitis, unspecified: Secondary | ICD-10-CM | POA: Diagnosis present

## 2015-09-11 MED ORDER — AMOXICILLIN-POT CLAVULANATE 875-125 MG PO TABS: 1.0000 | ORAL_TABLET | Freq: Two times a day (BID) | ORAL | 0 refills | Status: AC

## 2015-09-11 NOTE — Progress Notes (Signed)
   Subjective:   Patient ID: Marissa Carpenter, female    DOB: 09/21/1985, 30 y.o.   MRN: [id number]  Patient presents for Same Day Appointment. Video interpretor Asmaa [id number] used.  Chief Complaint  Patient presents with  . Sinusitis    HPI: #Sinus pressure Used to have sinus pain now going on 3 weeks left-sided face pressure Has associated headache Medications tried: Completed course of Keflex for UTI without resolution of symptoms Progression: Worsening No sick contacts  Able to eat and drink adequately  Now also having nosebleeds. States that she tried to blow thick mucous out her nose No known environmental or drug allergies  Symptoms Fever: no but prior had low-grade fever Cough: Yes Runny nose: yes Drooling: no Sinus pain: yes Headache: yes Sneezing: yes  Significant medical history - patient is [redacted] wks pregnant Never Smoker  Review of Systems   See HPI for ROS.   Past medical history, surgical, family, and social history reviewed and updated in the EMR as appropriate.  Objective:  BP 107/60   Pulse 75   Temp 98.5 F (36.9 C) (Oral)   Wt 214 lb (97.1 kg)   LMP 06/14/2015 (Exact Date)   SpO2 99%   BMI 36.73 kg/m  Vitals and nursing note reviewed  Physical Exam  Constitutional: She is well-developed, well-nourished, and in no distress.  HENT:  Nose: Mucosal edema present. Left sinus exhibits maxillary sinus tenderness and frontal sinus tenderness.  Mouth/Throat: Oropharynx is clear and moist.  Neck: Normal range of motion. Neck supple.  Cardiovascular: Normal rate and regular rhythm.   Pulmonary/Chest: Effort normal and breath sounds normal.  Lymphadenopathy:    She has no cervical adenopathy.    Assessment & Plan:  1. Acute maxillary sinusitis, recurrence not specified Sinusitis that was not previously treated with conservative measures and Keflex (was on for UTI and has some strep coverage). Patient is well-appearing and afebrile. Now likely a bacterial  infection with the persistence of symptoms. Rx for Augmentin given. Continue nasal saline. Follow-up prn if not improved after finishing Abx. Return precautions discussed. Handout given.    Caryl AdaJazma Phelps, DO 09/11/2015, 7:08 AM PGY-3, [facility]

## 2015-09-11 NOTE — Patient Instructions (Addendum)
Antibiotics sent to pharmacy Continue nasal saline to loosen mucous Avoid blowing nose hard to prevent nose bleeds Take tylenol for headache   If not improved after antibiotics completed come back in for further evaluation    Sinusitis, Adult Sinusitis is redness, soreness, and puffiness (inflammation) of the air pockets in the bones of your face (sinuses). The redness, soreness, and puffiness can cause air and mucus to get trapped in your sinuses. This can allow germs to grow and cause an infection.  HOME CARE   Drink enough fluids to keep your pee (urine) clear or pale yellow.  Use a humidifier in your home.  Run a hot shower to create steam in the bathroom. Sit in the bathroom with the door closed. Breathe in the steam 3-4 times a day.  Put a warm, moist washcloth on your face 3-4 times a day, or as told by your doctor.  Use salt water sprays (saline sprays) to wet the thick fluid in your nose. This can help the sinuses drain.  Only take medicine as told by your doctor. GET HELP RIGHT AWAY IF:   Your pain gets worse.  You have very bad headaches.  You are sick to your stomach (nauseous).  You throw up (vomit).  You are very sleepy (drowsy) all the time.  Your face is puffy (swollen).  Your vision changes.  You have a stiff neck.  You have trouble breathing. MAKE SURE YOU:   Understand these instructions.  Will watch your condition.  Will get help right away if you are not doing well or get worse.   This information is not intended to replace advice given to you by your health care provider. Make sure you discuss any questions you have with your health care provider.   Document Released: 06/30/2007 Document Revised: 02/01/2014 Document Reviewed: 08/17/2011 Elsevier Interactive Patient Education [employer]2016 Elsevier Inc.

## 2015-09-17 ENCOUNTER — Encounter: Payer: Self-pay | Admitting: Family Medicine

## 2015-09-17 ENCOUNTER — Ambulatory Visit (INDEPENDENT_AMBULATORY_CARE_PROVIDER_SITE_OTHER): Payer: Medicaid Other | Admitting: Family Medicine

## 2015-09-17 VITALS — BP 117/66 | HR 88 | Temp 98.4°F | Wt 219.2 lb

## 2015-09-17 DIAGNOSIS — R51 Headache: Secondary | ICD-10-CM

## 2015-09-17 DIAGNOSIS — R519 Headache, unspecified: Secondary | ICD-10-CM

## 2015-09-17 HISTORY — DX: Headache, unspecified: R51.9

## 2015-09-17 MED ORDER — CETIRIZINE HCL 10 MG PO TABS: 10.0000 mg | ORAL_TABLET | Freq: Every day | ORAL | 2 refills | Status: DC

## 2015-09-17 NOTE — Patient Instructions (Signed)
It was a pleasure seeing you today in our clinic. Today we discussed your facial pain. Here is the treatment plan we have discussed and agreed upon together:   - I've prescribed you Zyrtec. Take 1 tablet every night before bed. - I have sent in a order to have x-rays done of your sinuses. Please have these performed sometime this week. - Have sent a referral to ENT. He will be contacted to set up an appointment later this week or next week. He can occasionally take a month or 2 before they have an available appointment. - Continue using the nasal saline.

## 2015-09-17 NOTE — Assessment & Plan Note (Signed)
Patient is here with persistent left-sided facial pain. She has completed courses of Keflex and Augmentin for this issue in the past without resolution of symptoms. Etiology currently unknown however sinus infection seems like an appropriate diagnosis patient had not artery completed courses of antibiotics without improvement. Some concerns for possible polyp development. - X-rays of her sinuses ordered. - Encourage continued nasal saline. - Zyrtec daily - Referral to ENT place today.

## 2015-09-17 NOTE — Progress Notes (Signed)
   HPI  CC: Facial pain Patient is a 30 year old Arabic speaking female who is currently pregnant, she is currently complaining of left-sided facial and nasal pain. She states this is been going on for many months and she was seen in our office recently for the same issue. She has currently completed a course of Keflex and then most recently Augmentin. She denies any significant benefit from these medications. Pain is located to the left base of the nose and extends laterally below the eye. She also has some discomfort along her frontal sinus. She endorses no recent fevers, chills, cough, shortness of breath, sore throat. She states that she occasionally has some headaches. Pain seems to be worse at night but will be present every day intermittently throughout the day.  Review of Systems   See HPI for ROS. All other systems reviewed and are negative.  CC, SH/smoking status, and VS noted  Objective: BP 117/66   Pulse 88   Temp 98.4 F (36.9 C) (Oral)   Wt 219 lb 3.2 oz (99.4 kg)   LMP 06/14/2015 (Exact Date)   BMI 37.63 kg/m  Gen: NAD, alert, cooperative, and pleasant. HEENT: NCAT, EOMI, PERRL, OP clear, funduscopic exam unremarkable, TMs clear bilaterally, no LAD, TTP along the left-side of the nose extending across the zygomatic process as well as along the brow. CV: RRR, no murmur Resp: CTAB, no wheezes, non-labored Neuro: Alert and oriented, Speech clear, cranial nerves II through XII intact including bilateral visual fields.  Assessment and plan:  Left facial pain Patient is here with persistent left-sided facial pain. She has completed courses of Keflex and Augmentin for this issue in the past without resolution of symptoms. Etiology currently unknown however sinus infection seems like an appropriate diagnosis patient had not artery completed courses of antibiotics without improvement. Some concerns for possible polyp development. - X-rays of her sinuses ordered. - Encourage  continued nasal saline. - Zyrtec daily - Referral to ENT place today.   Orders Placed This Encounter  Procedures  . DG Sinuses Complete    Pregnancy Protocol    Standing Status:   Future    Standing Expiration Date:   11/16/2016    Order Specific Question:   Reason for Exam (SYMPTOM  OR DIAGNOSIS REQUIRED)    Answer:   Left Facial pain (PREGNANCY PROTOCOL)    Order Specific Question:   Is the patient pregnant?    Answer:   Yes    Comments:   Pregnancy protocol!    Order Specific Question:   Preferred imaging location?    Answer:   [facility]  . Ambulatory referral to ENT    Referral Priority:   Routine    Referral Type:   Consultation    Referral Reason:   Specialty Services Required    Requested Specialty:   Otolaryngology    Number of Visits Requested:   1    Meds ordered this encounter  Medications  . cetirizine (ZYRTEC) 10 MG tablet    Sig: Take 1 tablet (10 mg total) by mouth daily.    Dispense:  30 tablet    Refill:  2     Kathee DeltonIan D Biagio Snelson, MD,MS,  PGY3 09/17/2015 1:10 PM

## 2015-09-18 ENCOUNTER — Other Ambulatory Visit: Payer: Self-pay | Admitting: Family Medicine

## 2015-09-18 ENCOUNTER — Ambulatory Visit
Admission: RE | Admit: 2015-09-18 | Discharge: 2015-09-18 | Disposition: A | Discharge status: Home / Self Care | Payer: Medicaid Other | Source: Ambulatory Visit | Attending: Family Medicine | Admitting: Family Medicine

## 2015-09-18 DIAGNOSIS — R51 Headache: Principal | ICD-10-CM

## 2015-09-18 DIAGNOSIS — R519 Headache, unspecified: Secondary | ICD-10-CM

## 2015-10-02 ENCOUNTER — Telehealth: Payer: Self-pay | Admitting: Obstetrics and Gynecology

## 2015-10-02 NOTE — Telephone Encounter (Signed)
Would like x-ray results

## 2015-10-02 NOTE — Telephone Encounter (Signed)
Dr. Wende MottMcKeag had ordered this imaging but I reviewed it. Results were negative. No signs of sinus abnormalities. Please inform patient.

## 2015-10-03 NOTE — Telephone Encounter (Signed)
Using Arabic interpreter Rosanne SackKasey [phone], I informed pt of negative imagining results.

## 2016-02-16 ENCOUNTER — Ambulatory Visit (INDEPENDENT_AMBULATORY_CARE_PROVIDER_SITE_OTHER): Payer: Medicaid Other | Admitting: Obstetrics and Gynecology

## 2016-02-16 ENCOUNTER — Encounter: Payer: Self-pay | Admitting: Obstetrics and Gynecology

## 2016-02-16 VITALS — BP 100/52 | HR 92 | Temp 98.2°F | Ht 64.0 in | Wt 248.2 lb

## 2016-02-16 DIAGNOSIS — H539 Unspecified visual disturbance: Secondary | ICD-10-CM | POA: Diagnosis present

## 2016-02-16 LAB — OB RESULTS CONSOLE GBS: STREP GROUP B AG: NEGATIVE

## 2016-02-16 NOTE — Progress Notes (Signed)
     Subjective: Chief Complaint  Patient presents with  . Blurred Vision    HPI: Marissa Carpenter is a 31 y.o. presenting to clinic today to discuss the following:  #Vision concerns Patient states that she is having trouble seeing. This has been going on for the past 3-4 months. Far away objects appear blurry to her. Has never had an eye exam and has never worn glasses. Wants referral to an eye doctor. Denies any eye pain, discharge from eye, fevers, headache.   No other concerns  PMH significant for pregnancy currrently. No diabteres or hypertension.    ROS noted in HPI.  Past Medical, Surgical, Social, and Family History Reviewed & Updated per EMR. History  Smoking Status  . Never Smoker  Smokeless Tobacco  . Former NeurosurgeonUser  . Quit date: 08/23/2012    Comment: Hookah 2-3 times per week    Objective: BP (!) 100/52 (BP Location: Right Arm, Patient Position: Sitting, Cuff Size: Large)   Pulse 92   Temp 98.2 F (36.8 C) (Oral)   Ht 5\' 4"  (1.626 m)   Wt 248 lb 3.2 oz (112.6 kg)   LMP 06/14/2015 (Exact Date)   SpO2 99%   BMI 42.60 kg/m  Vitals and nursing notes reviewed  Physical Exam  Constitutional: She is well-developed, well-nourished, and in no distress.  Eyes: Conjunctivae, EOM and lids are normal. Pupils are equal, round, and reactive to light.  Abdominal:  Gravid uterus   Assessment/Plan: Please see problem based Assessment and Plan PATIENT EDUCATION PROVIDED: See AVS    Orders Placed This Encounter  Procedures  . Ambulatory referral to Optometry    Referral Priority:   Routine    Referral Type:   Vision Training and development officer(Optometry)    Referral Reason:   Specialty Services Required    Requested Specialty:   Optometry    Number of Visits Requested:   1    No orders of the defined types were placed in this encounter.   Caryl AdaJazma Phelps, DO 02/16/2016, 9:42 AM PGY-3, [facility]

## 2016-02-16 NOTE — Progress Notes (Deleted)
31 y.o. year old female presents for well woman/preventative visit and annual GYN examination. . Acute Concerns:  1. Vision concerns - trouble seeing. Far objects get blurry. For the past 3-4 months. Never worn glasses or had eyes checked.   Pregnancy.   Diet:  Exercise:  Sexual History: Birth history:  LMP:  Birth Control:  POA/Living Will:  Social:  Social History   Social History  . Marital status: Married    Spouse name: N/A  . Number of children: N/A  . Years of education: N/A   Social History Main Topics  . Smoking status: Never Smoker  . Smokeless tobacco: Former NeurosurgeonUser    Quit date: 08/23/2012     Comment: Hookah 2-3 times per week  . Alcohol use No  . Drug use: No  . Sexual activity: Yes    Birth control/ protection: None   Other Topics Concern  . 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   Physical Exam: VITALS: GEN: HEENT: CARDIAC: RESP: BREAST:Exam performed in the presence of a chaperone.  ABD: GU/GYN:Exam performed in the presence of a chaperone.  EXT: SKIN:  ASSESSMENT & PLAN: 31 y.o. female presents for annual well woman/preventative exam and GYN exam. Please see problem specific assessment and plan.

## 2016-02-16 NOTE — Patient Instructions (Signed)
Someone will call you about setting up an eye exam  Follow-up as needed

## 2016-02-17 DIAGNOSIS — H539 Unspecified visual disturbance: Secondary | ICD-10-CM | POA: Insufficient documentation

## 2016-02-17 NOTE — Assessment & Plan Note (Signed)
Complaining of blurred vision. No other associated symptoms. Only with distance. Concern for nearsightedness. Placed referral for patient to go to optometry for eye exam. She only has pregnancy Medicaid however so unsure if insurance will pay for this. Some visual impairment with eye chart noted (20/25 both eyes).

## 2016-03-03 ENCOUNTER — Encounter (HOSPITAL_COMMUNITY): Payer: Self-pay

## 2016-03-03 NOTE — Pre-Procedure Instructions (Signed)
[phone] interpreter number

## 2016-03-15 ENCOUNTER — Encounter (HOSPITAL_COMMUNITY)
Admission: RE | Admit: 2016-03-15 | Discharge: 2016-03-15 | Disposition: A | Discharge status: Home / Self Care | Payer: Medicaid Other | Source: Ambulatory Visit | Attending: Obstetrics and Gynecology | Admitting: Obstetrics and Gynecology

## 2016-03-15 LAB — COMPREHENSIVE METABOLIC PANEL
ALK PHOS: 95 U/L (ref 38–126)
ALT: 13 U/L — AB (ref 14–54)
AST: 17 U/L (ref 15–41)
Albumin: 2.8 g/dL — ABNORMAL LOW (ref 3.5–5.0)
Anion gap: 9 (ref 5–15)
BILIRUBIN TOTAL: 0.3 mg/dL (ref 0.3–1.2)
BUN: 7 mg/dL (ref 6–20)
CHLORIDE: 104 mmol/L (ref 101–111)
CO2: 23 mmol/L (ref 22–32)
Calcium: 8.8 mg/dL — ABNORMAL LOW (ref 8.9–10.3)
Creatinine, Ser: 0.5 mg/dL (ref 0.44–1.00)
Glucose, Bld: 92 mg/dL (ref 65–99)
POTASSIUM: 3.7 mmol/L (ref 3.5–5.1)
Sodium: 136 mmol/L (ref 135–145)
Total Protein: 6 g/dL — ABNORMAL LOW (ref 6.5–8.1)

## 2016-03-15 LAB — TYPE AND SCREEN
ABO/RH(D): A POS
Antibody Screen: NEGATIVE

## 2016-03-15 LAB — CBC
HCT: 35.2 % — ABNORMAL LOW (ref 36.0–46.0)
Hemoglobin: 11.9 g/dL — ABNORMAL LOW (ref 12.0–15.0)
MCH: 27.2 pg (ref 26.0–34.0)
MCHC: 33.8 g/dL (ref 30.0–36.0)
MCV: 80.4 fL (ref 78.0–100.0)
PLATELETS: 256 10*3/uL (ref 150–400)
RBC: 4.38 MIL/uL (ref 3.87–5.11)
RDW: 16.6 % — ABNORMAL HIGH (ref 11.5–15.5)
WBC: 13.8 10*3/uL — AB (ref 4.0–10.5)

## 2016-03-15 NOTE — Anesthesia Preprocedure Evaluation (Addendum)
Anesthesia Evaluation  Patient identified by MRN, date of birth, ID band Patient awake    Reviewed: Allergy & Precautions, H&P , Patient's Chart, lab work & pertinent test results  Airway Mallampati: III  TM Distance: >3 FB Neck ROM: full    Dental no notable dental hx.    Pulmonary    Pulmonary exam normal breath sounds clear to auscultation       Cardiovascular Exercise Tolerance: Good  Rhythm:regular Rate:Normal     Neuro/Psych    GI/Hepatic GERD  ,  Endo/Other  diabetes, GestationalMorbid obesity  Renal/GU      Musculoskeletal   Abdominal   Peds  Hematology   Anesthesia Other Findings   Reproductive/Obstetrics                            Anesthesia Physical Anesthesia Plan  ASA: III  Anesthesia Plan: Spinal, Epidural and Combined Spinal and Epidural   Post-op Pain Management:    Induction:   Airway Management Planned:   Additional Equipment:   Intra-op Plan:   Post-operative Plan:   Informed Consent: I have reviewed the patients History and Physical, chart, labs and discussed the procedure including the risks, benefits and alternatives for the proposed anesthesia with the patient or authorized representative who has indicated his/her understanding and acceptance.   Dental Advisory Given  Plan Discussed with: CRNA  Anesthesia Plan Comments: (Lab work and procedure  confirmed with CRNA in room; platelets okay. Discussed spinal anesthetic, and patient consents to the procedure:  included risk of possible headache,backache, failed block, allergic reaction, and nerve injury. This patient was asked if she had any questions or concerns before the procedure started.  )        Anesthesia Quick Evaluation

## 2016-03-15 NOTE — H&P (Signed)
Marissa Carpenter is a 31 y.o. female [id number] at 39+ weeks (EDD 03/20/16 by LMP c/Carpenter 7 week US)  presenting for scheduled repeat c-section with h/o c-section x 2.  Prenatal care also complicated by GDM which has been controlled on Insulin Novolin and novolog.  She has had reassuring fetal testing with NST's 2x a week.    OB History    Gravida Para Term Preterm AB Living   5 2 2   2 2    SAB TAB Ectopic Multiple Live Births   2       2    SAB x 2 2012 2014 Low Transverse c-section 9# 2015 Low Transverse c-section 9#7oz  Past Medical History:  Diagnosis Date  . Diabetes mellitus without complication (HCC)   . Gestational diabetes   . Headache(784.0)    otc med prn  . Miscarriage 08/26/2010  . SAB (spontaneous abortion) 2012   x 2 in 2012 - no surgery required   Past Surgical History:  Procedure Laterality Date  . CESAREAN SECTION N/A 05/09/2012   Procedure: Primary CESAREAN SECTION of baby boy  at 0435 APGAR 9/9;  Surgeon: Lavina Hammanodd Meisinger, MD;  Location: [facility];  Service: Obstetrics;  Laterality: N/A;  . CESAREAN SECTION N/A 05/18/2013   Procedure: REPEAT CESAREAN SECTION;  Surgeon: Oliver PilaKathy Carpenter Elzora Cullins, MD;  Location: [facility];  Service: Obstetrics;  Laterality: N/A;  . TONSILLECTOMY     Family History: family history includes Alcohol abuse in her father; Clotting disorder in her sister; Diabetes in her father; Hypertension in her father; Miscarriages / [place] in her sister. Social History:  reports that she has never smoked. She quit smokeless tobacco use about 3 years ago. She reports that she does not drink alcohol or use drugs.     Maternal Diabetes: Yes:  Diabetes Type:  Insulin/Medication controlled Genetic Screening: Normal Maternal Ultrasounds/Referrals: Normal Fetal Ultrasounds or other Referrals:  None Maternal Substance Abuse:  No Significant Maternal Medications:  Meds include: Other:  Insulin Significant Maternal Lab Results:  None Other Comments:  None  Review of Systems   Gastrointestinal: Negative for abdominal pain.   Maternal Medical History:  Contractions: Perceived severity is mild.    Fetal activity: Perceived fetal activity is normal.    Prenatal Complications - Diabetes: gestational. Diabetes is managed by insulin injections.        Last menstrual period 06/14/2015. Maternal Exam:  Uterine Assessment: Contraction strength is mild.  Contraction frequency is rare.   Abdomen: Patient reports no abdominal tenderness. Surgical scars: low transverse.   Fetal presentation: vertex  Introitus: Normal vulva. Normal vagina.    Physical Exam  Constitutional: She appears well-developed.  Cardiovascular: Normal rate.   Respiratory: Effort normal.  GI: Soft.  Genitourinary: Vagina normal.  Neurological: She is alert.  Psychiatric: She has a normal mood and affect.    Prenatal labs: ABO, Rh: --/--/A POS (02/19 1125) Antibody: NEG (02/19 1125) Rubella: Immune (07/27 0000) RPR: Nonreactive (07/27 0000)  HBsAg: Negative (07/27 0000)  HIV: Non-reactive (07/27 0000)  GBS: Negative (01/22 0000)  Early one hour 168, began FS First trimester screen and AFP negative  Assessment/Plan: Patient  was counseled regarding the risks and benefits of C-section and the procedure was reviewed in detail.  Risks of bleeding, infection, and possible damage to bowel and bladder were reviewed,  The patient would accept a blood transfusion if needed.  She desires to proceed.  She declines sterilization.   Oliver PilaRICHARDSON,Marissa Carpenter 03/15/2016, 5:41 PM

## 2016-03-15 NOTE — Patient Instructions (Signed)
20 Marissa Carpenter  03/15/2016   Your procedure is scheduled on:  03/16/2016  [facility].  Pick up the phone at the desk and dial [phone].   Call this number if you have problems the morning of surgery: [phone]   Remember:   Do not eat food:After Midnight.  Do not drink clear liquids: After Midnight.  Take these medicines the morning of surgery with A SIP OF WATER: take 8 units insulin tonight and NONE in the morning.  May take zantac in the morning   Do not wear jewelry, make-up or nail polish.  Do not wear lotions, powders, or perfumes. Do not wear deodorant.  Do not shave 48 hours prior to surgery.  Do not bring valuables to the hospital.  [facility] is not   responsible for any belongings or valuables brought to the hospital.  Contacts, dentures or bridgework may not be worn into surgery.  Leave suitcase in the car. After surgery it may be brought to your room.  For patients admitted to the hospital, checkout time is 11:00 AM the day of              discharge.   Patients discharged the day of surgery will not be allowed to drive             home.  Name and phone number of your driver: na  Special Instructions:   N/A   Please read over the following fact sheets that you were given:   Surgical Site Infection Prevention

## 2016-03-16 ENCOUNTER — Encounter (HOSPITAL_COMMUNITY)
Admission: RE | Disposition: A | Discharge status: Home / Self Care | Payer: Self-pay | Source: Ambulatory Visit | Attending: Obstetrics and Gynecology

## 2016-03-16 ENCOUNTER — Encounter (HOSPITAL_COMMUNITY): Payer: Self-pay | Admitting: *Deleted

## 2016-03-16 ENCOUNTER — Inpatient Hospital Stay (HOSPITAL_COMMUNITY): Payer: Medicaid Other | Admitting: Anesthesiology

## 2016-03-16 ENCOUNTER — Inpatient Hospital Stay (HOSPITAL_COMMUNITY)
Admission: RE | Admit: 2016-03-16 | Discharge: 2016-03-18 | DRG: 765 | Disposition: A | Discharge status: Home / Self Care | Payer: Medicaid Other | Source: Ambulatory Visit | Attending: Obstetrics and Gynecology | Admitting: Obstetrics and Gynecology

## 2016-03-16 DIAGNOSIS — O9962 Diseases of the digestive system complicating childbirth: Secondary | ICD-10-CM | POA: Diagnosis not present

## 2016-03-16 DIAGNOSIS — Z6841 Body Mass Index (BMI) 40.0 and over, adult: Secondary | ICD-10-CM | POA: Diagnosis not present

## 2016-03-16 DIAGNOSIS — O24424 Gestational diabetes mellitus in childbirth, insulin controlled: Secondary | ICD-10-CM | POA: Diagnosis present

## 2016-03-16 DIAGNOSIS — Z833 Family history of diabetes mellitus: Secondary | ICD-10-CM | POA: Diagnosis not present

## 2016-03-16 DIAGNOSIS — Z8249 Family history of ischemic heart disease and other diseases of the circulatory system: Secondary | ICD-10-CM | POA: Diagnosis not present

## 2016-03-16 DIAGNOSIS — K219 Gastro-esophageal reflux disease without esophagitis: Secondary | ICD-10-CM | POA: Diagnosis not present

## 2016-03-16 DIAGNOSIS — O34211 Maternal care for low transverse scar from previous cesarean delivery: Principal | ICD-10-CM | POA: Diagnosis present

## 2016-03-16 DIAGNOSIS — Z98891 History of uterine scar from previous surgery: Secondary | ICD-10-CM

## 2016-03-16 DIAGNOSIS — Z3A39 39 weeks gestation of pregnancy: Secondary | ICD-10-CM

## 2016-03-16 DIAGNOSIS — O99214 Obesity complicating childbirth: Secondary | ICD-10-CM | POA: Diagnosis not present

## 2016-03-16 LAB — GLUCOSE, CAPILLARY
GLUCOSE-CAPILLARY: 95 mg/dL (ref 65–99)
GLUCOSE-CAPILLARY: 109 mg/dL — AB (ref 65–99)
Glucose-Capillary: 99 mg/dL (ref 65–99)

## 2016-03-16 LAB — RPR: RPR: NONREACTIVE

## 2016-03-16 SURGERY — Surgical Case
Anesthesia: Epidural

## 2016-03-16 MED ORDER — LACTATED RINGERS IV SOLN
INTRAVENOUS | Status: DC | PRN
  Administered 2016-03-16 (×3): via INTRAVENOUS

## 2016-03-16 MED ORDER — HYDROMORPHONE HCL 1 MG/ML IJ SOLN
0.2500 mg | INTRAMUSCULAR | Status: DC | PRN
  Administered 2016-03-16 (×2): 0.5 mg via INTRAVENOUS

## 2016-03-16 MED ORDER — IBUPROFEN 600 MG PO TABS
600.0000 mg | ORAL_TABLET | Freq: Four times a day (QID) | ORAL | Status: DC
  Administered 2016-03-16 – 2016-03-18 (×6): 600 mg via ORAL
  Filled 2016-03-16 (×7): qty 1

## 2016-03-16 MED ORDER — SCOPOLAMINE 1 MG/3DAYS TD PT72
MEDICATED_PATCH | TRANSDERMAL | Status: DC | PRN
  Administered 2016-03-16: 1 via TRANSDERMAL

## 2016-03-16 MED ORDER — ONDANSETRON HCL 4 MG/2ML IJ SOLN
4.0000 mg | Freq: Three times a day (TID) | INTRAMUSCULAR | Status: DC | PRN
Start: 2016-03-16 — End: 2016-03-18

## 2016-03-16 MED ORDER — SOD CITRATE-CITRIC ACID 500-334 MG/5ML PO SOLN
30.0000 mL | Freq: Once | ORAL | Status: AC
  Administered 2016-03-16: 30 mL via ORAL
  Filled 2016-03-16: qty 15

## 2016-03-16 MED ORDER — MORPHINE SULFATE (PF) 0.5 MG/ML IJ SOLN
INTRAMUSCULAR | Status: AC
Start: 2016-03-16 — End: 2016-03-16
  Filled 2016-03-16: qty 10

## 2016-03-16 MED ORDER — ONDANSETRON HCL 4 MG/2ML IJ SOLN
INTRAMUSCULAR | Status: AC
  Filled 2016-03-16: qty 2

## 2016-03-16 MED ORDER — SCOPOLAMINE 1 MG/3DAYS TD PT72: 1.0000 | MEDICATED_PATCH | Freq: Once | TRANSDERMAL | Status: DC

## 2016-03-16 MED ORDER — SIMETHICONE 80 MG PO CHEW
80.0000 mg | CHEWABLE_TABLET | Freq: Three times a day (TID) | ORAL | Status: DC
  Administered 2016-03-17 – 2016-03-18 (×3): 80 mg via ORAL
  Filled 2016-03-16 (×2): qty 1

## 2016-03-16 MED ORDER — FENTANYL CITRATE (PF) 100 MCG/2ML IJ SOLN
INTRAMUSCULAR | Status: DC | PRN
  Administered 2016-03-16: 25 ug via INTRAVENOUS
  Administered 2016-03-16: 25 ug via EPIDURAL
  Administered 2016-03-16: 50 ug via INTRAVENOUS

## 2016-03-16 MED ORDER — NALBUPHINE HCL 10 MG/ML IJ SOLN: 5.0000 mg | INTRAMUSCULAR | Status: DC | PRN

## 2016-03-16 MED ORDER — DIPHENHYDRAMINE HCL 25 MG PO CAPS
25.0000 mg | ORAL_CAPSULE | ORAL | Status: DC | PRN
  Filled 2016-03-16: qty 1

## 2016-03-16 MED ORDER — NALOXONE HCL 0.4 MG/ML IJ SOLN: 0.4000 mg | INTRAMUSCULAR | Status: DC | PRN

## 2016-03-16 MED ORDER — NALOXONE HCL 2 MG/2ML IJ SOSY
1.0000 ug/kg/h | PREFILLED_SYRINGE | INTRAVENOUS | Status: DC | PRN
  Filled 2016-03-16: qty 2

## 2016-03-16 MED ORDER — DIPHENHYDRAMINE HCL 50 MG/ML IJ SOLN: 12.5000 mg | INTRAMUSCULAR | Status: DC | PRN

## 2016-03-16 MED ORDER — DIPHENHYDRAMINE HCL 25 MG PO CAPS
25.0000 mg | ORAL_CAPSULE | Freq: Four times a day (QID) | ORAL | Status: DC | PRN
  Filled 2016-03-16: qty 1

## 2016-03-16 MED ORDER — OXYTOCIN 10 UNIT/ML IJ SOLN
INTRAMUSCULAR | Status: DC | PRN
  Administered 2016-03-16: 40 [IU] via INTRAVENOUS

## 2016-03-16 MED ORDER — ACETAMINOPHEN 325 MG PO TABS: 650.0000 mg | ORAL_TABLET | ORAL | Status: DC | PRN

## 2016-03-16 MED ORDER — MEPERIDINE HCL 25 MG/ML IJ SOLN: 6.2500 mg | INTRAMUSCULAR | Status: DC | PRN

## 2016-03-16 MED ORDER — TETANUS-DIPHTH-ACELL PERTUSSIS 5-2.5-18.5 LF-MCG/0.5 IM SUSP: 0.5000 mL | Freq: Once | INTRAMUSCULAR | Status: DC

## 2016-03-16 MED ORDER — ONDANSETRON HCL 4 MG/2ML IJ SOLN
INTRAMUSCULAR | Status: DC | PRN
  Administered 2016-03-16: 4 mg via INTRAVENOUS

## 2016-03-16 MED ORDER — PHENYLEPHRINE 8 MG IN D5W 100 ML (0.08MG/ML) PREMIX OPTIME
INJECTION | INTRAVENOUS | Status: AC
  Filled 2016-03-16: qty 100

## 2016-03-16 MED ORDER — OXYCODONE HCL 5 MG PO TABS: 10.0000 mg | ORAL_TABLET | ORAL | Status: DC | PRN

## 2016-03-16 MED ORDER — SCOPOLAMINE 1 MG/3DAYS TD PT72
MEDICATED_PATCH | TRANSDERMAL | Status: AC
Start: 2016-03-16 — End: 2016-03-16
  Filled 2016-03-16: qty 1

## 2016-03-16 MED ORDER — NALBUPHINE HCL 10 MG/ML IJ SOLN: 5.0000 mg | Freq: Once | INTRAMUSCULAR | Status: DC | PRN

## 2016-03-16 MED ORDER — SENNOSIDES-DOCUSATE SODIUM 8.6-50 MG PO TABS
2.0000 | ORAL_TABLET | ORAL | Status: DC
  Administered 2016-03-17 (×2): 2 via ORAL
  Filled 2016-03-16 (×2): qty 2

## 2016-03-16 MED ORDER — ACETAMINOPHEN 500 MG PO TABS
1000.0000 mg | ORAL_TABLET | Freq: Four times a day (QID) | ORAL | Status: AC
  Administered 2016-03-16 – 2016-03-17 (×2): 1000 mg via ORAL
  Filled 2016-03-16 (×3): qty 2

## 2016-03-16 MED ORDER — ZOLPIDEM TARTRATE 5 MG PO TABS: 5.0000 mg | ORAL_TABLET | Freq: Every evening | ORAL | Status: DC | PRN

## 2016-03-16 MED ORDER — OXYTOCIN 10 UNIT/ML IJ SOLN
INTRAMUSCULAR | Status: AC
Start: 2016-03-16 — End: 2016-03-16
  Filled 2016-03-16: qty 4

## 2016-03-16 MED ORDER — PHENYLEPHRINE 8 MG IN D5W 100 ML (0.08MG/ML) PREMIX OPTIME
INJECTION | INTRAVENOUS | Status: DC | PRN
  Administered 2016-03-16: 60 ug/min via INTRAVENOUS

## 2016-03-16 MED ORDER — PRENATAL MULTIVITAMIN CH
1.0000 | ORAL_TABLET | Freq: Every day | ORAL | Status: DC
  Administered 2016-03-18: 1 via ORAL
  Filled 2016-03-16: qty 1

## 2016-03-16 MED ORDER — LACTATED RINGERS IV SOLN
INTRAVENOUS | Status: DC
  Administered 2016-03-16 – 2016-03-17 (×2): via INTRAVENOUS

## 2016-03-16 MED ORDER — HYDROMORPHONE HCL 1 MG/ML IJ SOLN
INTRAMUSCULAR | Status: AC
  Filled 2016-03-16: qty 1

## 2016-03-16 MED ORDER — MENTHOL 3 MG MT LOZG: 1.0000 | LOZENGE | OROMUCOSAL | Status: DC | PRN

## 2016-03-16 MED ORDER — CEFAZOLIN SODIUM-DEXTROSE 2-4 GM/100ML-% IV SOLN
2.0000 g | INTRAVENOUS | Status: AC
  Administered 2016-03-16: 2 g via INTRAVENOUS
  Filled 2016-03-16: qty 100

## 2016-03-16 MED ORDER — FENTANYL CITRATE (PF) 100 MCG/2ML IJ SOLN
INTRAMUSCULAR | Status: AC
  Filled 2016-03-16: qty 2

## 2016-03-16 MED ORDER — OXYTOCIN 40 UNITS IN LACTATED RINGERS INFUSION - SIMPLE MED: 2.5000 [IU]/h | INTRAVENOUS | Status: AC

## 2016-03-16 MED ORDER — OXYCODONE HCL 5 MG PO TABS: 5.0000 mg | ORAL_TABLET | ORAL | Status: DC | PRN

## 2016-03-16 MED ORDER — SIMETHICONE 80 MG PO CHEW: 80.0000 mg | CHEWABLE_TABLET | ORAL | Status: DC | PRN

## 2016-03-16 MED ORDER — LACTATED RINGERS IV SOLN
INTRAVENOUS | Status: DC
  Administered 2016-03-16: 08:00:00 via INTRAVENOUS

## 2016-03-16 MED ORDER — SODIUM CHLORIDE 0.9% FLUSH: 3.0000 mL | INTRAVENOUS | Status: DC | PRN

## 2016-03-16 MED ORDER — SIMETHICONE 80 MG PO CHEW
80.0000 mg | CHEWABLE_TABLET | ORAL | Status: DC
  Administered 2016-03-17: 80 mg via ORAL
  Filled 2016-03-16 (×2): qty 1

## 2016-03-16 MED ORDER — COCONUT OIL OIL
1.0000 | TOPICAL_OIL | Status: DC | PRN
Start: 2016-03-16 — End: 2016-03-18

## 2016-03-16 SURGICAL SUPPLY — 37 items
BENZOIN TINCTURE PRP APPL 2/3 (GAUZE/BANDAGES/DRESSINGS) ×3 IMPLANT
CHLORAPREP W/TINT 26ML (MISCELLANEOUS) ×3 IMPLANT
CLAMP CORD UMBIL (MISCELLANEOUS) IMPLANT
CLOSURE STERI STRIP 1/2 X4 (GAUZE/BANDAGES/DRESSINGS) ×3 IMPLANT
CLOSURE WOUND 1/2 X4 (GAUZE/BANDAGES/DRESSINGS)
CLOTH BEACON ORANGE TIMEOUT ST (SAFETY) ×3 IMPLANT
DRSG OPSITE POSTOP 4X10 (GAUZE/BANDAGES/DRESSINGS) ×3 IMPLANT
ELECT REM PT RETURN 9FT ADLT (ELECTROSURGICAL) ×3
ELECTRODE REM PT RTRN 9FT ADLT (ELECTROSURGICAL) ×1 IMPLANT
EXTRACTOR VACUUM KIWI (MISCELLANEOUS) IMPLANT
GLOVE BIO SURGEON STRL SZ 6.5 (GLOVE) ×2 IMPLANT
GLOVE BIO SURGEONS STRL SZ 6.5 (GLOVE) ×1
GLOVE BIOGEL PI IND STRL 7.0 (GLOVE) ×1 IMPLANT
GLOVE BIOGEL PI INDICATOR 7.0 (GLOVE) ×2
GOWN STRL REUS W/TWL LRG LVL3 (GOWN DISPOSABLE) ×6 IMPLANT
KIT ABG SYR 3ML LUER SLIP (SYRINGE) IMPLANT
NEEDLE HYPO 25X5/8 SAFETYGLIDE (NEEDLE) IMPLANT
NS IRRIG 1000ML POUR BTL (IV SOLUTION) ×3 IMPLANT
PACK C SECTION WH (CUSTOM PROCEDURE TRAY) ×3 IMPLANT
PAD OB MATERNITY 4.3X12.25 (PERSONAL CARE ITEMS) ×3 IMPLANT
PENCIL SMOKE EVAC W/HOLSTER (ELECTROSURGICAL) ×3 IMPLANT
RTRCTR C-SECT PINK 25CM LRG (MISCELLANEOUS) ×3 IMPLANT
STRIP CLOSURE SKIN 1/2X4 (GAUZE/BANDAGES/DRESSINGS) IMPLANT
SUT CHROMIC 1 CTX 36 (SUTURE) ×6 IMPLANT
SUT PLAIN 0 NONE (SUTURE) IMPLANT
SUT PLAIN 2 0 XLH (SUTURE) ×3 IMPLANT
SUT VIC AB 0 CT1 27 (SUTURE) ×4
SUT VIC AB 0 CT1 27XBRD ANBCTR (SUTURE) ×2 IMPLANT
SUT VIC AB 2-0 CT1 27 (SUTURE) ×2
SUT VIC AB 2-0 CT1 TAPERPNT 27 (SUTURE) ×1 IMPLANT
SUT VIC AB 3-0 CT1 27 (SUTURE)
SUT VIC AB 3-0 CT1 TAPERPNT 27 (SUTURE) IMPLANT
SUT VIC AB 3-0 SH 27 (SUTURE) ×2
SUT VIC AB 3-0 SH 27X BRD (SUTURE) ×1 IMPLANT
SUT VIC AB 4-0 KS 27 (SUTURE) ×3 IMPLANT
TOWEL OR 17X24 6PK STRL BLUE (TOWEL DISPOSABLE) ×3 IMPLANT
TRAY FOLEY CATH SILVER 14FR (SET/KITS/TRAYS/PACK) ×3 IMPLANT

## 2016-03-16 NOTE — Anesthesia Postprocedure Evaluation (Signed)
Anesthesia Post Note  Patient: Marissa Carpenter  Procedure(s) Performed: Procedure(s) (LRB): REPEAT CESAREAN SECTION (N/A)  Patient location during evaluation: Mother Baby Anesthesia Type: Spinal Level of consciousness: awake and alert and oriented Pain management: satisfactory to patient Vital Signs Assessment: post-procedure vital signs reviewed and stable Respiratory status: respiratory function stable and spontaneous breathing Cardiovascular status: blood pressure returned to baseline Postop Assessment: no headache, no backache, spinal receding, patient able to bend at knees and adequate PO intake Anesthetic complications: no        Last Vitals:  Vitals:   03/16/16 1240 03/16/16 1340  BP: (!) 98/46   Pulse: 93   Resp: 16 16  Temp: 37.7 C 37 C    Last Pain:  Vitals:   03/16/16 1340  TempSrc: Oral  PainSc: 3    Pain Goal: Patients Stated Pain Goal: 7 (03/16/16 0915)               Karleen DolphinFUSSELL,Dario Yono

## 2016-03-16 NOTE — Transfer of Care (Signed)
Immediate Anesthesia Transfer of Care Note  Patient: Marissa Carpenter  Procedure(s) Performed: Procedure(s) with comments: REPEAT CESAREAN SECTION (N/A) - MD requests RNFA  Patient Location: PACU  Anesthesia Type:Spinal  Level of Consciousness: awake, alert  and oriented  Airway & Oxygen Therapy: Patient Spontanous Breathing  Post-op Assessment: Report given to RN and Post -op Vital signs reviewed and stable  Post vital signs: Reviewed and stable  Last Vitals:  Vitals:   03/16/16 0539  BP: 126/70  Pulse: (!) 103  Temp: 36.7 C    Last Pain:  Vitals:   03/16/16 0539  TempSrc: Oral         Complications: No apparent anesthesia complications

## 2016-03-16 NOTE — Op Note (Signed)
Operative Note    Preoperative Diagnosis Term Pregnancy at 39+ weeks Gestational Diabetes Prior c-section x 2  Postoperative Diagnosis same  Procedure Repeat Low Transverse C-section  Surgeon Huel CoteKathy Easton Sivertson, MD  Anesthesia Spinal  Fluids: EBL 650ml UOP 200ml clear IVF 3300ml LR  Findings Uterus appeared normal.  Tubes and ovaries appeared normal.  Anterior mesenteric adhesions taken down.  Viable female infant in vertex position.  Apgars 8,9  Specimen Placenta to L&D  Procedure Note Patient was taken to the operating room where spinal anesthesia was obtained and found to be adequate by Allis clamp test. She was prepped and draped in the normal sterile fashion in the dorsal supine position with a leftward tilt. An appropriate time out was performed. A Pfannenstiel skin incision was then made through a pre-existing scar with the scalpel and carried through to the underlying layer of fascia by sharp dissection and Bovie cautery. The fascia was nicked in the midline and the incision was extended laterally with Mayo scissors. The inferior aspect of the incision was grasped Coker clamps and dissected off the underlying rectus muscles. In a similar fashion the superior aspect was dissected off the rectus muscles. Rectus muscles were separated in the midline and the peritoneal cavity entered bluntly. The peritoneal incision was then extended both superiorly and inferiorly with careful attention to avoid both bowel and bladder. The Alexis self-retaining wound retractor was then placed within the incision and the lower uterine segment exposed. The bladder flap was developed with Metzenbaum scissors and pushed away from the lower uterine segment. The lower uterine segment was then incised in a transverse fashion and the cavity itself entered bluntly. The incision was extended bluntly. The infant's head was then lifted and delivered from the incision without difficulty. The remainder of the  infant delivered and the nose and mouth bulb suctioned with the cord clamped and cut as well. The infant was handed off to the waiting pediatricians. The placenta was then spontaneously expressed from the uterus and the uterus cleared of all clots and debris with moist lap sponge. The uterine incision was then repaired in 2 layers the first layer was a running locked layer of 1-0 chromic and the second an imbricating layer of the same suture. The tubes and ovaries were inspected and the gutters cleared of all clots and debris. The uterine incision was inspected and found to be hemostatic. All instruments and sponges as well as the Alexis retractor were then removed from the abdomen. The rectus muscles and peritoneum were then reapproximated with a running suture of 2-0 Vicryl. The fascia was then closed with 0 Vicryl in a running fashion. Subcutaneous tissue was reapproximated with 3-0 plain in a running fashion. The skin was closed with a subcuticular stitch of 4-0 Vicryl on a Keith needle and then reinforced with benzoin and Steri-Strips. At the conclusion of the procedure all instruments and sponge counts were correct. Patient was taken to the recovery room in good condition with her baby accompanying her skin to skin.

## 2016-03-16 NOTE — Lactation Note (Signed)
This note was copied from a baby's chart. Lactation Consultation Note   Arabic interpreter at bedside.  Experienced BF mom with baby STS.  Mom speaks some English mostly Arabic.  Mom states that infant just nursed very well on both sides.  Mom declines to learn or demonstrate hand expression so LC showed mom Mom/Baby book and hand expression page.  BF basics reviewed.  Dad very encouraging.  BF brochure/resource sheet/ and support group info left for mom.  She states she does read some [place].  Mom  Very receptive to information and states she will call out for further help/assistance if needed.    Patient Name: Girl Leticia PennaRahil Dhami ZOXWR'UToday's Date: 03/16/2016 Reason for consult: Initial assessment   Maternal Data    Feeding    LATCH Score/Interventions                      Lactation Tools Discussed/Used     Consult Status Consult Status: Follow-up Date: 03/17/16 Follow-up type: In-patient    Maryruth HancockKelly Suzanne [facility] 03/16/2016, 1:43 PM

## 2016-03-16 NOTE — Anesthesia Procedure Notes (Signed)
Spinal  Patient location during procedure: OR Staffing Anesthesiologist: Cristela BlueJACKSON, Karina Nofsinger Preanesthetic Checklist Completed: patient identified, site marked, surgical consent, pre-op evaluation, timeout performed, IV checked, risks and benefits discussed and monitors and equipment checked Spinal Block Patient position: sitting Prep: DuraPrep Patient monitoring: cardiac monitor, continuous pulse ox, blood pressure and heart rate Approach: midline Location: L3-4 Injection technique: catheter Needle Needle type: Tuohy and Sprotte  Needle gauge: 24 G Needle length: 12.7 cm Needle insertion depth: 7 cm Catheter type: closed end flexible Catheter size: 19 g Catheter at skin depth: 14 cm Additional Notes Spinal Dosage in OR  Bupivicaine ml       1.6 PFMS04   mcg        100  Catheter passed easily; (-) asp

## 2016-03-16 NOTE — Anesthesia Postprocedure Evaluation (Signed)
Anesthesia Post Note  Patient: Marissa Carpenter  Procedure(s) Performed: Procedure(s) (LRB): REPEAT CESAREAN SECTION (N/A)  Patient location during evaluation: PACU Anesthesia Type: Spinal Level of consciousness: awake Pain management: satisfactory to patient Vital Signs Assessment: post-procedure vital signs reviewed and stable Respiratory status: spontaneous breathing Cardiovascular status: blood pressure returned to baseline Postop Assessment: no headache and spinal receding Anesthetic complications: no        Last Vitals:  Vitals:   03/16/16 1020 03/16/16 1025  BP:    Pulse: 93 87  Resp: 17 15  Temp:      Last Pain:  Vitals:   03/16/16 1015  TempSrc: Oral  PainSc:    Pain Goal: Patients Stated Pain Goal: 7 (03/16/16 0915)               Cristela BlueJACKSON,Gillie Fleites EDWARD

## 2016-03-16 NOTE — Progress Notes (Signed)
RN noticed patient had epidural in when she got up. Anesthesia called and per Dr Jean RosenthalJackson epidural can be pulled.  L&D notified.

## 2016-03-16 NOTE — Addendum Note (Signed)
Addendum  created 03/16/16 1545 by Graciela HusbandsWynn O Mahrukh Seguin, CRNA   Sign clinical note

## 2016-03-16 NOTE — Progress Notes (Signed)
Patient ID: Marissa Carpenter, female   DOB: 12/29/1985, 31 y.o.   MRN: [id number] Pt presents for repeat c-section.  FBS 95 this AM and pt reports no changes in dictated H&P.  Brief exam WNL and ready to proceed.

## 2016-03-17 ENCOUNTER — Encounter (HOSPITAL_COMMUNITY): Payer: Self-pay | Admitting: Obstetrics and Gynecology

## 2016-03-17 LAB — CBC
HEMATOCRIT: 31.4 % — AB (ref 36.0–46.0)
Hemoglobin: 10.6 g/dL — ABNORMAL LOW (ref 12.0–15.0)
MCH: 27 pg (ref 26.0–34.0)
MCHC: 33.8 g/dL (ref 30.0–36.0)
MCV: 79.9 fL (ref 78.0–100.0)
PLATELETS: 204 10*3/uL (ref 150–400)
RBC: 3.93 MIL/uL (ref 3.87–5.11)
RDW: 16.8 % — AB (ref 11.5–15.5)
WBC: 12.6 10*3/uL — AB (ref 4.0–10.5)

## 2016-03-17 LAB — BIRTH TISSUE RECOVERY COLLECTION (PLACENTA DONATION)

## 2016-03-17 MED ORDER — DIBUCAINE 1 % RE OINT: 1.0000 "application " | TOPICAL_OINTMENT | RECTAL | Status: DC | PRN

## 2016-03-17 MED ORDER — WITCH HAZEL-GLYCERIN EX PADS: 1.0000 "application " | MEDICATED_PAD | CUTANEOUS | Status: DC | PRN

## 2016-03-17 NOTE — Progress Notes (Signed)
Subjective: Postpartum Day #1: Cesarean Delivery Patient reports tolerating PO.    Objective: Vital signs in last 24 hours: Temp:  [97.2 F (36.2 C)-99.9 F (37.7 C)] 99.6 F (37.6 C) (02/21 0600) Pulse Rate:  [60-95] 60 (02/21 0600) Resp:  [15-27] 18 (02/21 0600) BP: (96-120)/(43-73) 99/44 (02/21 0600) SpO2:  [94 %-100 %] 97 % (02/21 0200)  Physical Exam:  General: alert Lochia: appropriate Uterine Fundus: firm Incision: dressing C/D/I   Recent Labs  03/15/16 1123 03/17/16 0553  HGB 11.9* 10.6*  HCT 35.2* 31.4*  All CBG are normal so far  Assessment/Plan: Status post Cesarean section. Doing well postoperatively.  Continue current care, ambulate, will d/c CBG.  Burney Calzadilla D 03/17/2016, 8:31 AM

## 2016-03-18 ENCOUNTER — Encounter (HOSPITAL_COMMUNITY): Payer: Self-pay | Admitting: *Deleted

## 2016-03-18 MED ORDER — IBUPROFEN 600 MG PO TABS: 600.0000 mg | ORAL_TABLET | Freq: Four times a day (QID) | ORAL | 1 refills | Status: DC | PRN

## 2016-03-18 NOTE — Lactation Note (Signed)
This note was copied from a baby's chart. Lactation Consultation Note Mom Breast/formula feeding. Mom sleeping, attempted to consult. RN stated doing well. Patient Name: Marissa Carpenter ZOXWR'UToday's Date: 03/18/2016     Maternal Data    Feeding    LATCH Score/Interventions                      Lactation Tools Discussed/Used     Consult Status      Charyl DancerCARVER, Quamaine Webb G 03/18/2016, 5:22 AM

## 2016-03-18 NOTE — Discharge Summary (Signed)
OB Discharge Summary     Patient Name: Marissa Carpenter DOB: [birth year] MRN: [id number]  Date of admission: 03/16/2016 Delivering MD: Huel Cote   Date of discharge: 03/18/2016  Admitting diagnosis: Repeat C-Section Intrauterine pregnancy: [redacted]w[redacted]d     Secondary diagnosis:  Active Problems:   S/P repeat low transverse C-section  Additional problems: gestational diabetes     Discharge diagnosis: Term Pregnancy Delivered and GDM A2                                                                                                Post partum procedures:none  Augmentation: n/a  Complications: None  Hospital course:  Sceduled C/S   31 y.o. yo [id number] at [redacted]w[redacted]d was admitted to the hospital 03/16/2016 for scheduled cesarean section with the following indication:Elective Repeat.  Membrane Rupture Time/Date: 8:15 AM ,03/16/2016   Patient delivered a Viable infant.03/16/2016  Details of operation can be found in separate operative note.  Pateint had an uncomplicated postpartum course.  She is ambulating, tolerating a regular diet, passing flatus, and urinating well. Patient is discharged home in stable condition on  03/18/16         Physical exam  Vitals:   03/17/16 0600 03/17/16 0830 03/17/16 1851 03/18/16 0544  BP: (!) 99/44 (!) 100/52 118/72 109/62  Pulse: 60 76 71 81  Resp: 18 18 18 18   Temp: 99.6 F (37.6 C) 97.8 F (36.6 C) 97.9 F (36.6 C) 98.8 F (37.1 C)  TempSrc: Oral Oral Oral   SpO2:      Weight:      Height:       General: alert, cooperative and no distress Lochia: appropriate Uterine Fundus: firm Incision: Dressing is clean, dry, and intact- scant old drainage only DVT Evaluation: No evidence of DVT seen on physical exam. Negative Homan's sign. Labs: Lab Results  Component Value Date   WBC 12.6 (H) 03/17/2016   HGB 10.6 (L) 03/17/2016   HCT 31.4 (L) 03/17/2016   MCV 79.9 03/17/2016   PLT 204 03/17/2016   CMP Latest Ref Rng & Units 03/15/2016  Glucose 65 - 99  mg/dL 92  BUN 6 - 20 mg/dL 7  Creatinine 1.47 - 8.29 mg/dL 5.62  Sodium 130 - 865 mmol/L 136  Potassium 3.5 - 5.1 mmol/L 3.7  Chloride 101 - 111 mmol/L 104  CO2 22 - 32 mmol/L 23  Calcium 8.9 - 10.3 mg/dL 7.8(I)  Total Protein 6.5 - 8.1 g/dL 6.0(L)  Total Bilirubin 0.3 - 1.2 mg/dL 0.3  Alkaline Phos 38 - 126 U/L 95  AST 15 - 41 U/L 17  ALT 14 - 54 U/L 13(L)    Discharge instruction: per After Visit Summary and "Baby and Me Booklet".  After visit meds:  Allergies as of 03/18/2016   No Known Allergies     Medication List    TAKE these medications   ibuprofen 600 MG tablet Commonly known as:  ADVIL,MOTRIN Take 1 tablet (600 mg total) by mouth every 6 (six) hours as needed for fever, headache, moderate pain or cramping. What changed:  medication strength  how  much to take  when to take this  reasons to take this   NOVOLIN N 100 UNIT/ML injection Generic drug:  insulin NPH Human Inject 16-22 Units into the skin 2 (two) times daily. 22 units in the morning & 16 units at night.   NOVOLOG 100 UNIT/ML injection Generic drug:  insulin aspart Inject 4 Units into the skin daily.   prenatal multivitamin Tabs tablet Take 1 tablet by mouth daily at 12 noon.   ranitidine 150 MG tablet Commonly known as:  ZANTAC Take 1 tablet (150 mg total) by mouth 2 (two) times daily. What changed:  when to take this  reasons to take this       Diet: carb modified diet  Activity: Advance as tolerated. Pelvic rest for 6 weeks.   Outpatient follow up:2 weeks and 6weeks Follow up Appt:No future appointments. Follow up Visit:No Follow-up on file.  Postpartum contraception: IUD Paragard  Newborn Data: Live born female  Birth Weight: 8 lb 9.7 oz (3904 g) APGAR: 9, 10  Baby Feeding: Breast Disposition:home with mother   03/18/2016 [facility] Hospital[facility]ecilia Worema ParkervilleBanga, DO

## 2016-03-18 NOTE — Progress Notes (Signed)
Patient ID: Marissa Carpenter, female   DOB: 12/17/1985, 31 y.o.   MRN: [id number] Pt doing well. She reports some pain on right lateral side - feels crampy. No fever, chills, nausea, vomiting, or headaches. Ambulating, tolerating diet and voiding well. Passing flatus. Lochia is mild. She is breastfeeding with no issues - bonding well with baby. Requesting early discharge to home today VSS ABD- dressing with scant drainage only, intact, mild tenderness as expected; no bruising or erythema on side, NTTP EXT - no homans, no edema  A/P: POD #2 s/p repeat c/s - stable         Heating pad for right side and rec ambulating to help with stretching and gas pain relief          Reviewed discharge instructions - f/u in two weeks for incision check and in 6weeks for pp visit           She would like paraguard for bc

## 2016-03-18 NOTE — Discharge Instructions (Signed)
Nothing in vagina for 6 weeks.  No sex, tampons, and douching.  Other instructions as in [facility] Discharge Booklet. °

## 2016-03-18 NOTE — Lactation Note (Signed)
This note was copied from a baby's chart. Lactation Consultation Note  Experience BF mother reports SN. Upon assessment it is noted that they are cracked. Comfort gels given to mother with instructions. Hand expression reviewed. Mom is able to easily hand expression colostrum. Hand pump given to mother with instructions. Engorgement prevention and relief also was reviewed. Mother is open to latch assistance but baby is asleep. Follow-up when baby is hungry. Patient Name: Marissa Carpenter Reason for consult: Follow-up assessment   Maternal Data Does the patient have breastfeeding experience prior to this delivery?: Yes  Feeding Feeding Type: Breast Fed  LATCH Score/Interventions Latch: Grasps breast easily, tongue down, lips flanged, rhythmical sucking.  Audible Swallowing: Spontaneous and intermittent  Type of Nipple: Everted at rest and after stimulation  Comfort (Breast/Nipple): Filling, red/small blisters or bruises, mild/mod discomfort  Problem noted: Mild/Moderate discomfort  Hold (Positioning): No assistance needed to correctly position infant at breast.  LATCH Score: 9  Lactation Tools Discussed/Used Pump Review: Setup, frequency, and cleaning   Consult Status Consult Status: PRN    Soyla DryerJoseph, Amadea Keagy Carpenter, 12:01 PM

## 2016-03-22 ENCOUNTER — Encounter (HOSPITAL_COMMUNITY): Payer: Self-pay | Admitting: *Deleted

## 2016-03-28 ENCOUNTER — Inpatient Hospital Stay (HOSPITAL_COMMUNITY)
Admission: AD | Admit: 2016-03-28 | Discharge: 2016-03-28 | Disposition: A | Discharge status: Home / Self Care | Payer: Medicaid Other | Source: Ambulatory Visit | Attending: Obstetrics and Gynecology | Admitting: Obstetrics and Gynecology

## 2016-03-28 DIAGNOSIS — Z811 Family history of alcohol abuse and dependence: Secondary | ICD-10-CM | POA: Diagnosis not present

## 2016-03-28 DIAGNOSIS — T814XXA Infection following a procedure, initial encounter: Secondary | ICD-10-CM | POA: Diagnosis not present

## 2016-03-28 DIAGNOSIS — Z833 Family history of diabetes mellitus: Secondary | ICD-10-CM | POA: Diagnosis not present

## 2016-03-28 DIAGNOSIS — E119 Type 2 diabetes mellitus without complications: Secondary | ICD-10-CM | POA: Diagnosis not present

## 2016-03-28 DIAGNOSIS — Z8249 Family history of ischemic heart disease and other diseases of the circulatory system: Secondary | ICD-10-CM | POA: Diagnosis not present

## 2016-03-28 DIAGNOSIS — O34219 Maternal care for unspecified type scar from previous cesarean delivery: Secondary | ICD-10-CM | POA: Diagnosis present

## 2016-03-28 DIAGNOSIS — IMO0001 Reserved for inherently not codable concepts without codable children: Secondary | ICD-10-CM

## 2016-03-28 DIAGNOSIS — R51 Headache: Secondary | ICD-10-CM | POA: Insufficient documentation

## 2016-03-28 MED ORDER — CEPHALEXIN 500 MG PO CAPS: 500.0000 mg | ORAL_CAPSULE | Freq: Four times a day (QID) | ORAL | 0 refills | Status: DC

## 2016-03-28 NOTE — MAU Provider Note (Signed)
History   S/P c/s of 03/16/16 in with c/o drainage from incision. Denies fever. Discharge is sl yellow in appearance.  CSN: [id number]  Arrival date & time 03/28/16  1730   None     Chief Complaint  Patient presents with  . Wound Check    HPI  Past Medical History:  Diagnosis Date  . Diabetes mellitus without complication (HCC)   . Gestational diabetes   . Headache(784.0)    otc med prn  . Miscarriage 08/26/2010  . SAB (spontaneous abortion) 2012   x 2 in 2012 - no surgery required    Past Surgical History:  Procedure Laterality Date  . CESAREAN SECTION N/A 05/09/2012   Procedure: Primary CESAREAN SECTION of baby boy  at 0435 APGAR 9/9;  Surgeon: Lavina Hammanodd Meisinger, MD;  Location: [facility];  Service: Obstetrics;  Laterality: N/A;  . CESAREAN SECTION N/A 05/18/2013   Procedure: REPEAT CESAREAN SECTION;  Surgeon: Oliver PilaKathy W Richardson, MD;  Location: [facility];  Service: Obstetrics;  Laterality: N/A;  . CESAREAN SECTION N/A 03/16/2016   Procedure: REPEAT CESAREAN SECTION;  Surgeon: Huel CoteKathy Richardson, MD;  [facility];  Service: Obstetrics;  Laterality: N/A;  MD requests RNFA  . TONSILLECTOMY      Family History  Problem Relation Age of Onset  . Miscarriages / Stillbirths Sister   . Clotting disorder Sister   . Alcohol abuse Father   . Diabetes Father   . Hypertension Father     Social History  Substance Use Topics  . Smoking status: Never Smoker  . Smokeless tobacco: Former NeurosurgeonUser    Quit date: 08/23/2012     Comment: Hookah 2-3 times per week  . Alcohol use No    OB History    Gravida Para Term Preterm AB Living   5 3 3   2 3    SAB TAB Ectopic Multiple Live Births   2       3      Review of Systems  Constitutional: Negative.   HENT: Negative.   Eyes: Negative.   Respiratory: Negative.   Cardiovascular: Negative.   Gastrointestinal: Positive for abdominal pain.  Endocrine: Negative.   Genitourinary: Negative.   Musculoskeletal: Negative.   Skin:  Negative.     Allergies  Patient has no known allergies.  Home Medications   Current Outpatient Rx  . Order #: [id number] Class: Normal    BP 114/70 (BP Location: Right Arm)   Pulse 71   Temp 98.1 F (36.7 C) (Oral)   Resp 18   Physical Exam  Constitutional: She is oriented to person, place, and time. She appears well-developed and well-nourished.  HENT:  Head: Normocephalic.  Eyes: Pupils are equal, round, and reactive to light.  Neck: Normal range of motion.  Cardiovascular: Normal rate, regular rhythm, normal heart sounds and intact distal pulses.   Pulmonary/Chest: Effort normal and breath sounds normal.  Abdominal: Soft. Bowel sounds are normal.  Incision red inflamed   Musculoskeletal: Normal range of motion.  Neurological: She is alert and oriented to person, place, and time. She has normal reflexes.  Skin: Skin is warm and dry.  Psychiatric: She has a normal mood and affect. Her behavior is normal. Judgment and thought content normal.    MAU Course  Procedures (including critical care time)  Labs Reviewed - No data to display No results found.   1. Postoperative wound infection, initial encounter       MDM  2 cm area encircling incision sl  red, warm to touch, with yellowish mucoid drainage. Area appears not to have been cleaned for several days. Area cleaned well with peroxide. Pt and husband given thorough instructions on how to care for her incision. POC discussed with Dr. Jackelyn Knife. To d/c home and follow up with Dr. Senaida Ores tomorrow.

## 2016-03-28 NOTE — MAU Note (Signed)
C/s 10 days ago, started having incision bleeding today, burning.

## 2016-03-28 NOTE — Discharge Instructions (Signed)
Wound Infection °A wound infection happens when germs start to grow in the wound. Germs that cause wound infections are most often bacteria. Other types of infections can occur as well. In some cases, infection can cause the wound to break open. Wound infections need treatment. If a wound infection is not treated, complications can happen. °Follow these instructions at home: °Medicines  °· Take or apply over-the-counter and prescription medicines only as told by your doctor. °· If you were prescribed antibiotic medicine, take or apply it as told by your doctor. Do not stop using the antibiotic even if your condition improves. °Wound care  °· Clean the wound each day or as told by your doctor. °¨ Wash the wound with mild soap and water. °¨ Rinse the wound with water to remove all soap. °¨ Pat the wound dry with a clean towel. Do not rub it. °· Follow instructions from your doctor about how to take care of your wound. Make sure you: °¨ Wash your hands with soap and water before you change your bandage (dressing). If you cannot use soap and water, use hand sanitizer. °¨ Change your bandage as told by your doctor. °¨ Leave stitches (sutures), skin glue, or skin tape (adhesive) strips in place if your wound has been closed. They may need to stay in place for 2 weeks or longer. If tape strips get loose and curl up, you may trim the loose edges. Do not remove tape strips completely unless your doctor says it is okay. Some wounds are left open to heal on their own. °· Check your wound every day for signs of infection. Watch for: °¨ More redness, swelling, or pain. °¨ More fluid or blood. °¨ Warmth. °¨ Pus or a bad smell. °General instructions  °· Keep the bandage dry until your doctor says it can be removed. °· Do not take baths, swim, use a hot tub, or do anything that would put your wound underwater until your doctor says it is okay. °· Raise (elevate) the injured area above the level of your heart while you are sitting  or lying down. °· Do not scratch or pick at the wound. °· Keep all follow-up visits as told by your doctor. This is important. °Contact a doctor if: °· Medicine does not help your pain. °· You have more redness, swelling, or pain in the area of your wound. °· You have more fluid or blood coming from your wound. °· Your wound feels warm to the touch. °· You have pus coming from your wound. °· You continue to notice a bad smell coming from your wound or your bandage. °· Your wound that was closed breaks open. °Get help right away if: °· You have a red streak going away from your wound. °· You have a fever. °This information is not intended to replace advice given to you by your health care provider. Make sure you discuss any questions you have with your health care provider. °Document Released: 10/21/2007 Document Revised: 06/19/2015 Document Reviewed: 07/01/2014 °Elsevier Interactive Patient Education © 2017 [employer]vier Inc. ° °

## 2016-03-28 NOTE — MAU Note (Signed)
Cleaned incision with peroxide and rinsed with NS, dry ABD pad applied to area.

## 2016-08-06 NOTE — Anesthesia Postprocedure Evaluation (Signed)
Anesthesia Post Note  Patient: Marissa Carpenter  Procedure(s) Performed: Procedure(s) (LRB): REPEAT CESAREAN SECTION (N/A)     Anesthesia Post Evaluation  Last Vitals:  Vitals:   03/17/16 1851 03/18/16 0544  BP: 118/72 109/62  Pulse: 71 81  Resp: 18 18  Temp: 36.6 C 37.1 C    Last Pain:  Vitals:   03/18/16 1257  TempSrc:   PainSc: 0-No pain                 Vianney Kopecky EDWARD

## 2016-08-06 NOTE — Addendum Note (Signed)
Addendum  created 08/06/16 1532 by Lawyer Washabaugh, MD   Sign clinical note    

## 2017-05-11 ENCOUNTER — Ambulatory Visit: Payer: Medicaid Other

## 2017-06-05 IMAGING — DX DG ABD PORTABLE 1V
1 series · 1 of 1 positions shown · non-contrast
Comparison: Portable abdominal film January 15, 2015

CLINICAL DATA: Small bowel obstruction, bilateral groin pain, 2
days of constipation.

EXAM:
PORTABLE ABDOMEN - 1 VIEW

[abdomen kub]
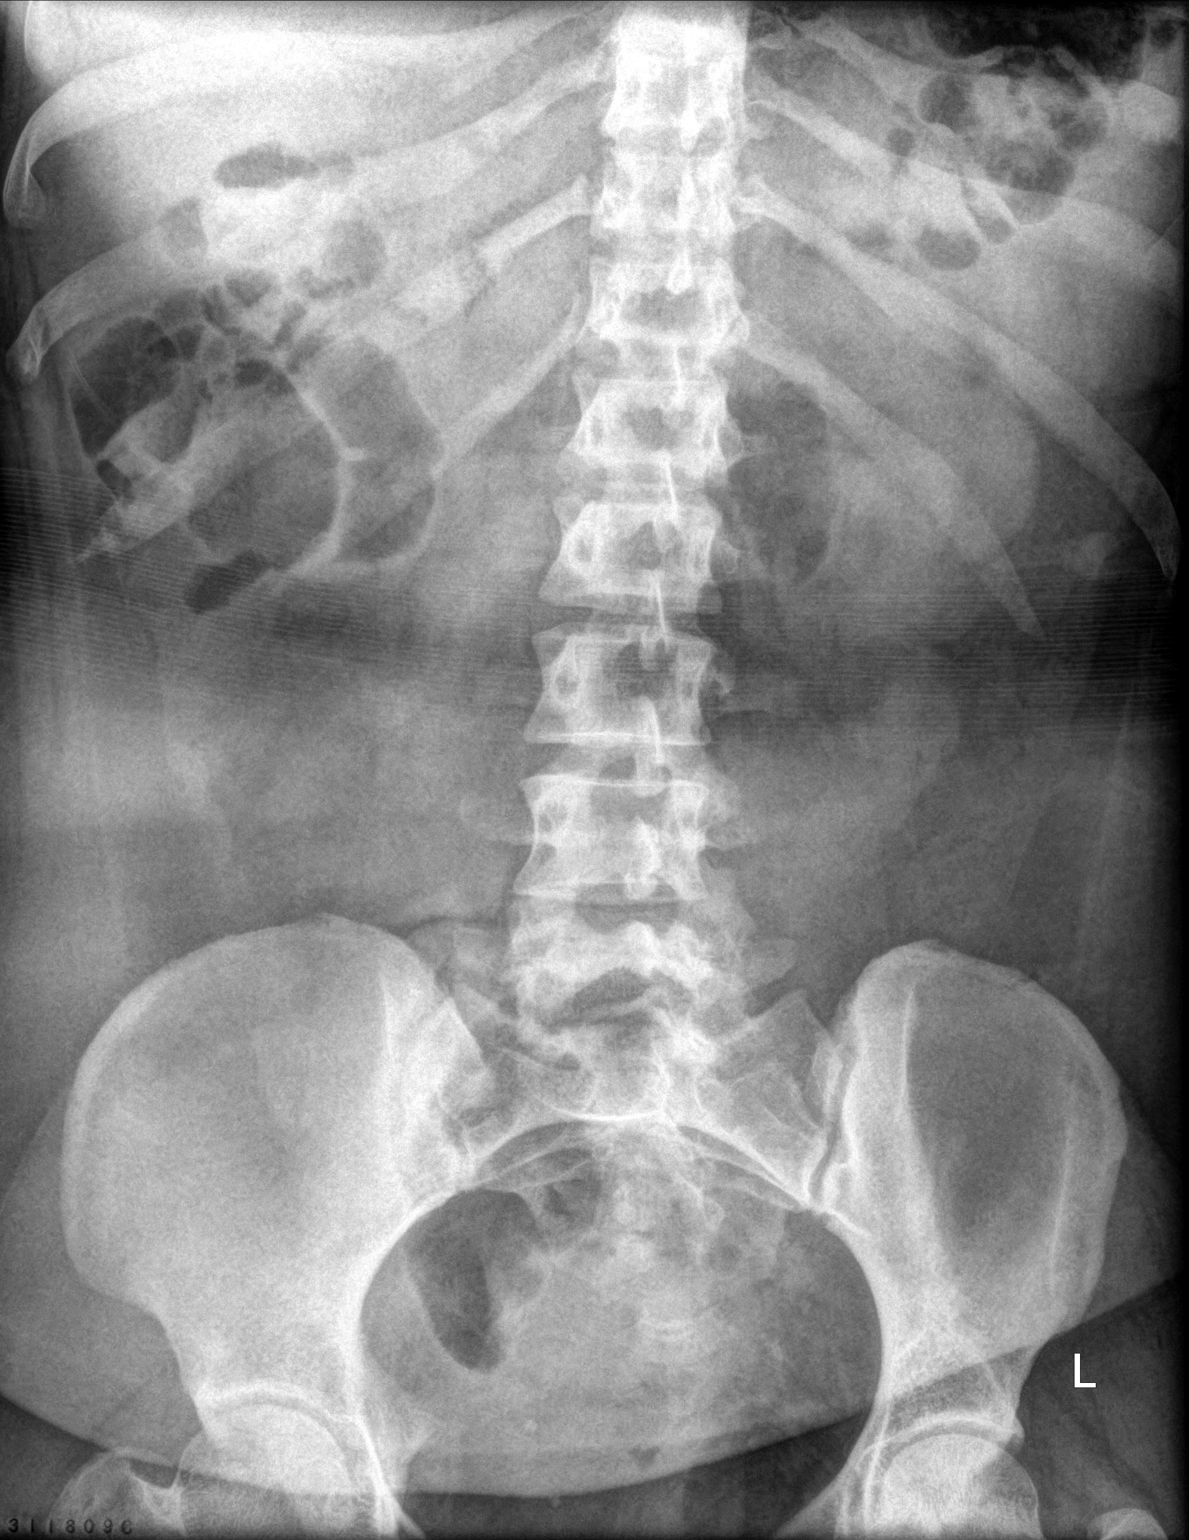

[1 of 1 positions shown; findings below may reference images not displayed]

FINDINGS: The study today is technically limited. There are loops of minimally
distended gas-filled bowel in the upper abdomen. There is gas and
stool in the normal calibered transverse colon. There is a small
amount of gas in the pelvis and to the left of the mid thoracic
spine. No soft tissue calcifications are observed.
IMPRESSION: Limited study without objective evidence of obstruction or
perforation. The colonic stool burden cannot be adequately assessed.

## 2017-06-11 ENCOUNTER — Encounter (HOSPITAL_COMMUNITY): Payer: Self-pay | Admitting: Emergency Medicine

## 2017-06-11 ENCOUNTER — Emergency Department (HOSPITAL_COMMUNITY)
Admission: EM | Admit: 2017-06-11 | Discharge: 2017-06-11 | Disposition: A | Discharge status: Home / Self Care | Payer: Medicaid Other | Attending: Emergency Medicine | Admitting: Emergency Medicine

## 2017-06-11 ENCOUNTER — Emergency Department (HOSPITAL_COMMUNITY): Payer: Medicaid Other

## 2017-06-11 ENCOUNTER — Emergency Department (HOSPITAL_COMMUNITY)
Admission: EM | Admit: 2017-06-11 | Discharge: 2017-06-11 | Discharge status: Against Medical Advice | Payer: Medicaid Other

## 2017-06-11 DIAGNOSIS — Y939 Activity, unspecified: Secondary | ICD-10-CM | POA: Insufficient documentation

## 2017-06-11 DIAGNOSIS — W1830XA Fall on same level, unspecified, initial encounter: Secondary | ICD-10-CM | POA: Insufficient documentation

## 2017-06-11 DIAGNOSIS — S93401A Sprain of unspecified ligament of right ankle, initial encounter: Secondary | ICD-10-CM

## 2017-06-11 DIAGNOSIS — E119 Type 2 diabetes mellitus without complications: Secondary | ICD-10-CM | POA: Insufficient documentation

## 2017-06-11 DIAGNOSIS — Y929 Unspecified place or not applicable: Secondary | ICD-10-CM | POA: Insufficient documentation

## 2017-06-11 DIAGNOSIS — Y999 Unspecified external cause status: Secondary | ICD-10-CM | POA: Insufficient documentation

## 2017-06-11 DIAGNOSIS — Z794 Long term (current) use of insulin: Secondary | ICD-10-CM | POA: Insufficient documentation

## 2017-06-11 MED ORDER — IBUPROFEN 400 MG PO TABS: 400.0000 mg | ORAL_TABLET | Freq: Four times a day (QID) | ORAL | 0 refills | Status: DC | PRN

## 2017-06-11 MED ORDER — ACETAMINOPHEN 325 MG PO TABS: 650.0000 mg | ORAL_TABLET | Freq: Four times a day (QID) | ORAL | 0 refills | Status: AC | PRN

## 2017-06-11 MED ORDER — LIDOCAINE 5 % EX PTCH: 1.0000 | MEDICATED_PATCH | CUTANEOUS | 0 refills | Status: DC

## 2017-06-11 NOTE — ED Provider Notes (Signed)
[facility] Provider Note   CSN: [id number] Arrival date & time: 06/11/17  1335     History   Chief Complaint Chief Complaint  Patient presents with  . Fall  . Ankle Pain    HPI Marissa Carpenter is a 32 y.o. female.  HPI   Patient is a 32 year old female presents the ED today complaining of right ankle pain that began suddenly yesterday after she had a mechanical fall.  States that she rolled her ankle and had sudden onset of pain.  She has tried taking ibuprofen yesterday for her symptoms with no improvement.  She is not taking any medication today as she is currently fasting.  Denies any numbness or weakness to the bilateral feet.  She has been ambulatory at home however has worsening pain when she tries to walk.  Denies any other injuries from the fall including no head injury or LOC.  No knee pain or other injuries.  Past Medical History:  Diagnosis Date  . Diabetes mellitus without complication (HCC)   . Gestational diabetes   . Headache(784.0)    otc med prn  . Miscarriage 08/26/2010  . SAB (spontaneous abortion) 2012   x 2 in 2012 - no surgery required    Patient Active Problem List   Diagnosis Date Noted  . S/P repeat low transverse C-section 03/16/2016  . Vision abnormalities 02/17/2016  . Left facial pain 09/17/2015  . Pregnancy 07/18/2015  . Obesity (BMI 30-39.9) 01/15/2015  . Metatarsalgia of right foot 11/18/2014  . Acanthosis nigricans 08/08/2013  . Low back pain on left side with sciatica 10/20/2012  . Hyperlipidemia 03/04/2011  . GERD (gastroesophageal reflux disease) 01/15/2011    Past Surgical History:  Procedure Laterality Date  . CESAREAN SECTION N/A 05/09/2012   Procedure: Primary CESAREAN SECTION of baby boy  at 0435 APGAR 9/9;  Surgeon: Lavina Hamman, MD;  Location: [facility];  Service: Obstetrics;  Laterality: N/A;  . CESAREAN SECTION N/A 05/18/2013   Procedure: REPEAT CESAREAN SECTION;  Surgeon: Oliver Pila,  MD;  Location: [facility];  Service: Obstetrics;  Laterality: N/A;  . CESAREAN SECTION N/A 03/16/2016   Procedure: REPEAT CESAREAN SECTION;  Surgeon: Huel Cote, MD;  Location: [facility];  Service: Obstetrics;  Laterality: N/A;  MD requests RNFA  . TONSILLECTOMY       OB History    Gravida  5   Para  3   Term  3   Preterm      AB  2   Living  3     SAB  2   TAB      Ectopic      Multiple      Live Births  3            Home Medications    Prior to Admission medications   Medication Sig Start Date End Date Taking? Authorizing Provider  acetaminophen (TYLENOL) 325 MG tablet Take 2 tablets (650 mg total) by mouth every 6 (six) hours as needed. Do not take more than  of tylenol per day 06/11/17   Cristie Mckinney S, PA-C  cephALEXin (KEFLEX) 500 MG capsule Take 1 capsule (500 mg total) by mouth 4 (four) times daily. 03/28/16   Montez Morita, CNM  ibuprofen (ADVIL,MOTRIN) 400 MG tablet Take 1 tablet (400 mg total) by mouth every 6 (six) hours as needed. 06/11/17   Emmani Lesueur S, PA-C  lidocaine (LIDODERM) 5 % Place 1 patch onto the skin  daily. Remove & Discard patch within 12 hours or as directed by MD 06/11/17   Clancy Leiner S, PA-C  NOVOLIN N 100 UNIT/ML injection Inject 16-22 Units into the skin 2 (two) times daily. 22 units in the morning & 16 units at night. 02/10/16   [provider]  NOVOLOG 100 UNIT/ML injection Inject 4 Units into the skin daily. 02/26/16   [provider]  Prenatal Vit-Fe Fumarate-FA (PRENATAL MULTIVITAMIN) TABS tablet Take 1 tablet by mouth daily at 12 noon.    [provider]  ranitidine (ZANTAC) 150 MG tablet Take 1 tablet (150 mg total) by mouth 2 (two) times daily. Patient taking differently: Take 150 mg by mouth 2 (two) times daily as needed for heartburn.  08/11/15   Rasch, Harolyn Rutherford, NP    Family History Family History  Problem Relation Age of Onset  . Miscarriages / Stillbirths Sister   .  Clotting disorder Sister   . Alcohol abuse Father   . Diabetes Father   . Hypertension Father     Social History Social History   Tobacco Use  . Smoking status: Never Smoker  . Smokeless tobacco: Former Neurosurgeon  . Tobacco comment: Hookah 2-3 times per week  Substance Use Topics  . Alcohol use: No  . Drug use: No     Allergies   Patient has no known allergies.   Review of Systems Review of Systems  Constitutional: Negative for fever.  Musculoskeletal:       Right ankle pain, right ankle swelling  Neurological: Negative for weakness and numbness.       No head trauma or loc     Physical Exam Updated Vital Signs BP 109/76 (BP Location: Left Arm)   Pulse 77   Temp 98.8 F (37.1 C) (Oral)   Resp 18   LMP 05/29/2017   SpO2 100%   Physical Exam  Constitutional: She appears well-developed and well-nourished. No distress.  HENT:  Head: Normocephalic and atraumatic.  Eyes: Conjunctivae are normal.  Neck: Neck supple.  Cardiovascular: Normal rate and regular rhythm.  No murmur heard. Pulmonary/Chest: Effort normal and breath sounds normal. No respiratory distress.  Abdominal: Soft. There is no tenderness.  Musculoskeletal:  Right ankle: Swelling noted to lateral malleolus. Diffuse ttp to medial and lateral malleolus, worse to lateral aspect. No ttp throughout the foot. Achilles tendon intact. No ttp to fibula or tibia and no obvious deformity on exam. Dorsiflexion/plantarflexion intact and strength 5/5 bilat however is painful. DP pulses and sensation intact bilaterally.   Neurological: She is alert.  Skin: Skin is warm and dry.  Psychiatric: She has a normal mood and affect.  Nursing note and vitals reviewed.    ED Treatments / Results  Labs (all labs ordered are listed, but only abnormal results are displayed) Labs Reviewed - No data to display  EKG None  Radiology Dg Ankle Complete Right  Result Date: 06/11/2017 CLINICAL DATA:  Right lateral ankle pain  after fall yesterday. EXAM: RIGHT ANKLE - COMPLETE 3+ VIEW COMPARISON:  Right foot x-rays dated November 05, 2014. FINDINGS: No acute fracture or dislocation. The ankle mortise is symmetric. The talar dome is intact. Joint spaces are preserved. Moderate tibiotalar joint effusion. Bone mineralization is normal. Prominent soft tissue swelling over the lateral malleolus. IMPRESSION: 1. Prominent soft tissue swelling over the lateral malleolus and moderate tibiotalar joint effusion. 2. No acute osseous abnormality. Electronically Signed   By: Obie Dredge M.D.   On: 06/11/2017 14:36  Procedures Procedures (including critical care time) SPLINT APPLICATION Date/Time: 4:34 PM Authorized by: Karrie Meres Consent: Verbal consent obtained. Risks and benefits: risks, benefits and alternatives were discussed Consent given by: patient Splint applied by: orthopedic technician Location details: RLE Splint type: ASO splint Supplies used: ASO ankle splint Post-procedure: The splinted body part was neurovascularly unchanged following the procedure. Patient tolerance: Patient tolerated the procedure well with no immediate complications.   Medications Ordered in ED Medications - No data to display   Initial Impression / Assessment and Plan / ED Course  I have reviewed the triage vital signs and the nursing notes.  Pertinent labs & imaging results that were available during my care of the patient were reviewed by me and considered in my medical decision making (see chart for details).   Final Clinical Impressions(s) / ED Diagnoses   Final diagnoses:  Sprain of right ankle, unspecified ligament, initial encounter   Patient presenting with ankle pain after twisting ankle prior to arrival.  Vital signs stable and patient nontoxic-appearing.  X-ray of left ankle negative for acute fracture. soft tissue swelling over the lateral malleolus and moderate tibiotalar joint effusion noted.   A splint was  applied and crutches given.  OrthO follow-up given and patient advised to follow-up with either PCP or orthopedics in 1 week for reevaluation.  Advised Tylenol, ibuprofen, lidocaine patches, and rice protocol for pain.  Advised to return to the ER for any new or worsening symptoms in the meantime.  All questions were answered and patient understands plan and reasons to return.  ED Discharge Orders        Ordered    acetaminophen (TYLENOL) 325 MG tablet  Every 6 hours PRN     06/11/17 1624    ibuprofen (ADVIL,MOTRIN) 400 MG tablet  Every 6 hours PRN     06/11/17 1624    lidocaine (LIDODERM) 5 %  Every 24 hours     06/11/17 1624       Emmory Solivan S, PA-C 06/11/17 1634    Pricilla Loveless, MD 06/11/17 2338

## 2017-06-11 NOTE — ED Triage Notes (Signed)
Patient c/o right ankle pain after trip and fall yesterday. Denies head injury and LOC. Ambulatory.

## 2017-06-11 NOTE — Discharge Instructions (Addendum)
You may alternate taking Tylenol and Ibuprofen as needed for pain control. You may take 400-600 mg of ibuprofen every 6 hours and [phone] mg of Tylenol every 6 hours. Do not exceed 4000 mg of Tylenol daily as this can lead to liver damage. Also, make sure to take Ibuprofen with meals as it can cause an upset stomach. Do not take other NSAIDs while taking Ibuprofen such as (Aleve, Naprosyn, Aspirin, Celebrex, etc) and do not take more than the prescribed dose as this can lead to ulcers and bleeding in your GI tract. You may use warm and cold compresses to help with your symptoms.   You were also given a prescription for lidocaine patches which you may use once daily to the affected area.  You were also given a ankle splint and crutches for your pain.  Please follow up with your primary doctor within the next 7-10 days for re-evaluation and further treatment of your symptoms.  You were also given a referral to the orthopedic doctor whom you can call to make an appointment with for follow-up.  You may need to have repeat x-rays completed to rule out an occult fracture.  Please return to the ER sooner if you have any new or worsening symptoms.

## 2017-06-15 ENCOUNTER — Ambulatory Visit: Payer: Medicaid Other

## 2017-06-15 ENCOUNTER — Other Ambulatory Visit: Payer: Self-pay | Admitting: Family Medicine

## 2017-06-15 ENCOUNTER — Telehealth: Payer: Self-pay | Admitting: Family Medicine

## 2017-06-15 DIAGNOSIS — K0889 Other specified disorders of teeth and supporting structures: Secondary | ICD-10-CM

## 2017-06-15 NOTE — Telephone Encounter (Signed)
Will forward to MD to place referral. Jazmin Hartsell,CMA  

## 2017-06-15 NOTE — Telephone Encounter (Signed)
Referral placed.

## 2017-06-15 NOTE — Telephone Encounter (Signed)
Pt would like to have a referral to see the dentist. She has the [facility] card. She is having pain with one of her teeth.

## 2017-07-19 ENCOUNTER — Encounter: Payer: Self-pay | Admitting: Internal Medicine

## 2017-07-19 ENCOUNTER — Ambulatory Visit (INDEPENDENT_AMBULATORY_CARE_PROVIDER_SITE_OTHER): Payer: Self-pay | Admitting: Internal Medicine

## 2017-07-19 ENCOUNTER — Other Ambulatory Visit: Payer: Self-pay

## 2017-07-19 VITALS — BP 118/70 | HR 74 | Temp 98.8°F | Ht 64.0 in | Wt 202.8 lb

## 2017-07-19 DIAGNOSIS — L03316 Cellulitis of umbilicus: Secondary | ICD-10-CM

## 2017-07-19 MED ORDER — DOXYCYCLINE HYCLATE 100 MG PO TABS: 100.0000 mg | ORAL_TABLET | Freq: Two times a day (BID) | ORAL | 0 refills | Status: DC

## 2017-07-19 NOTE — Progress Notes (Signed)
   Subjective:    Marissa Carpenter - 32 y.o. female MRN [id number]  Date of birth: 04/24/1985  HPI  Marissa Carpenter is here for redness around her belly button. Noticed it two days ago and it has continued to spread on the abdominal wall. She has noticed some purulent drainage with a foul odor. The area is painful. Denies fevers, chills, vomiting.   -  reports that she has never smoked. She quit smokeless tobacco use about 4 years ago. - Review of Systems: Per HPI. - Past Medical History: Patient Active Problem List   Diagnosis Date Noted  . S/P repeat low transverse C-section 03/16/2016  . Vision abnormalities 02/17/2016  . Left facial pain 09/17/2015  . Pregnancy 07/18/2015  . Obesity (BMI 30-39.9) 01/15/2015  . Metatarsalgia of right foot 11/18/2014  . Acanthosis nigricans 08/08/2013  . Low back pain on left side with sciatica 10/20/2012  . Hyperlipidemia 03/04/2011  . GERD (gastroesophageal reflux disease) 01/15/2011   - Medications: reviewed and updated   Objective:   Physical Exam BP 118/70   Pulse 74   Temp 98.8 F (37.1 C) (Oral)   Ht 5\' 4"  (1.626 m)   Wt 202 lb 12.8 oz (92 kg)   LMP 07/19/2017 (Exact Date)   SpO2 99%   BMI 34.81 kg/m  Gen: NAD, alert, cooperative with exam, well-appearing Abd: SNTND, BS present, no guarding or organomegaly Skin: umbilicus with surrounded by circular area of erythema approximately 10 cm in diameter with minimal amount of purulent drainage present    Assessment & Plan:   1. Cellulitis of umbilicus Exam appears consistent with cellulitis. Will treat with 10 day course of Doxycyline. Strict return precautions discussed.  - doxycycline (VIBRA-TABS) 100 MG tablet; Take 1 tablet (100 mg total) by mouth 2 (two) times daily.  Dispense: 20 tablet; Refill: 0   Marcy Sirenatherine Bobbye Petti, D.O. 07/19/2017, 3:20 PM PGY-3, [facility]

## 2017-07-19 NOTE — Patient Instructions (Signed)
I have prescribed an antibiotic, Doxycyline, that you should take twice per day for ten days. If the redness continues to spread, the drainage gets worse, you are unable to keep the antibiotic down, or you have fevers you should be seen again.    Cellulitis, Adult Cellulitis is a skin infection. The infected area is usually red and sore. This condition occurs most often in the arms and lower legs. It is very important to get treated for this condition. Follow these instructions at home:  Take over-the-counter and prescription medicines only as told by your doctor.  If you were prescribed an antibiotic medicine, take it as told by your doctor. Do not stop taking the antibiotic even if you start to feel better.  Drink enough fluid to keep your pee (urine) clear or pale yellow.  Do not touch or rub the infected area.  Raise (elevate) the infected area above the level of your heart while you are sitting or lying down.  Place warm or cold wet cloths (warm or cold compresses) on the infected area. Do this as told by your doctor.  Keep all follow-up visits as told by your doctor. This is important. These visits let your doctor make sure your infection is not getting worse. Contact a doctor if:  You have a fever.  Your symptoms do not get better after 1-2 days of treatment.  Your bone or joint under the infected area starts to hurt after the skin has healed.  Your infection comes back. This can happen in the same area or another area.  You have a swollen bump in the infected area.  You have new symptoms.  You feel ill and also have muscle aches and pains. Get help right away if:  Your symptoms get worse.  You feel very sleepy.  You throw up (vomit) or have watery poop (diarrhea) for a long time.  There are red streaks coming from the infected area.  Your red area gets larger.  Your red area turns darker. This information is not intended to replace advice given to you by your  health care provider. Make sure you discuss any questions you have with your health care provider. Document Released: 06/30/2007 Document Revised: 06/19/2015 Document Reviewed: 11/20/2014 Elsevier Interactive Patient Education  2018 [employer]

## 2017-09-27 ENCOUNTER — Ambulatory Visit: Payer: Medicaid Other

## 2017-11-08 ENCOUNTER — Telehealth: Payer: Self-pay | Admitting: Family Medicine

## 2017-11-08 ENCOUNTER — Other Ambulatory Visit: Payer: Self-pay | Admitting: Family Medicine

## 2017-11-08 DIAGNOSIS — Z9189 Other specified personal risk factors, not elsewhere classified: Secondary | ICD-10-CM

## 2017-11-08 NOTE — Telephone Encounter (Signed)
Will forward to MD. Jazmin Hartsell,CMA  

## 2017-11-08 NOTE — Telephone Encounter (Signed)
Order placed

## 2017-11-08 NOTE — Telephone Encounter (Signed)
Pt with [facility] card - would like a referral to a dentist. Please advise

## 2017-12-05 IMAGING — US US OB TRANSVAGINAL
1 series · 15 of 28 positions shown · non-contrast
Comparison: None.

CLINICAL DATA: Abdominal pain for 2 weeks. Pain is in the left
lower quadrant. Evaluate for an ectopic pregnancy. History of
miscarriages. Gestational age by LMP is 4 weeks and 6 days.

EXAM:
OBSTETRIC <14 WK US AND TRANSVAGINAL OB US
TECHNIQUE: Both transabdominal and transvaginal ultrasound examinations were
performed for complete evaluation of the gestation as well as the
maternal uterus, adnexal regions, and pelvic cul-de-sac.
Transvaginal technique was performed to assess early pregnancy.

[Series 1: us ob transvaginal · 15 of 86 slices shown]
[im 1/86]
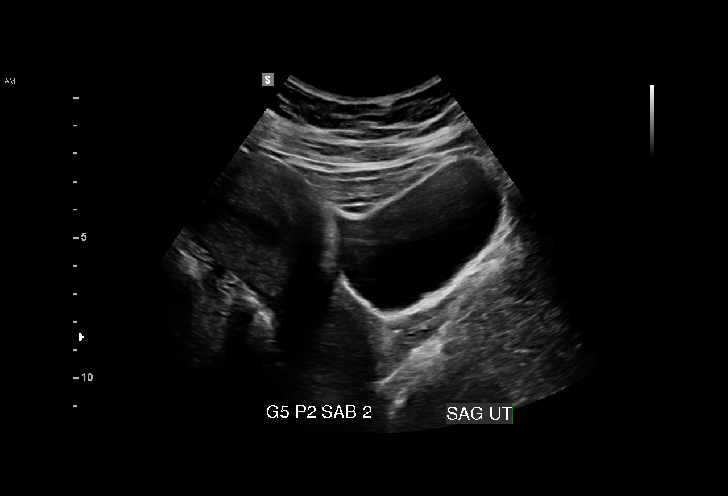
[im 7/86]
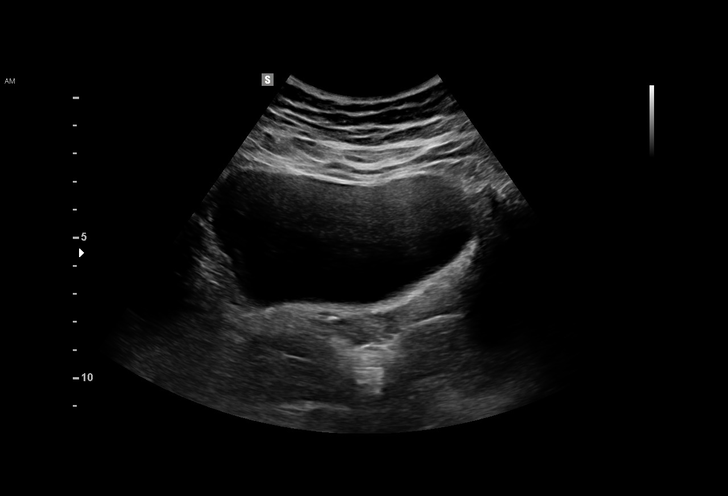
[im 13/86]
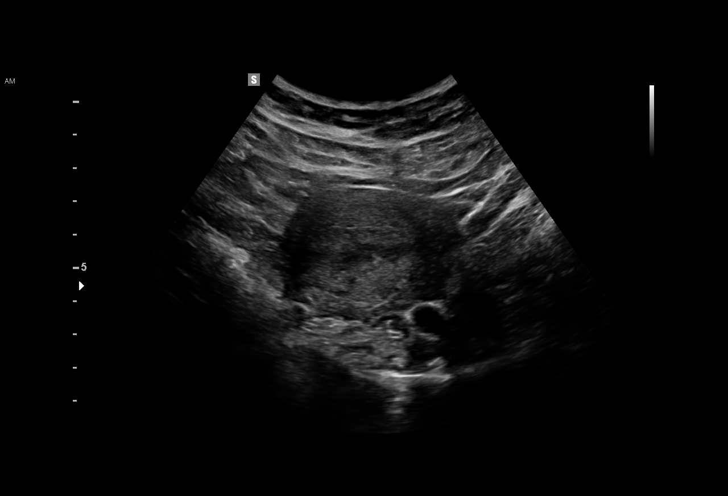
[im 19/86]
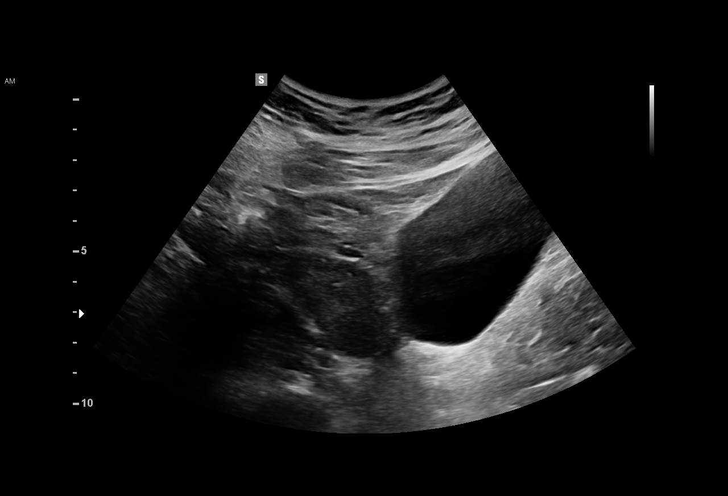
[im 26/86]
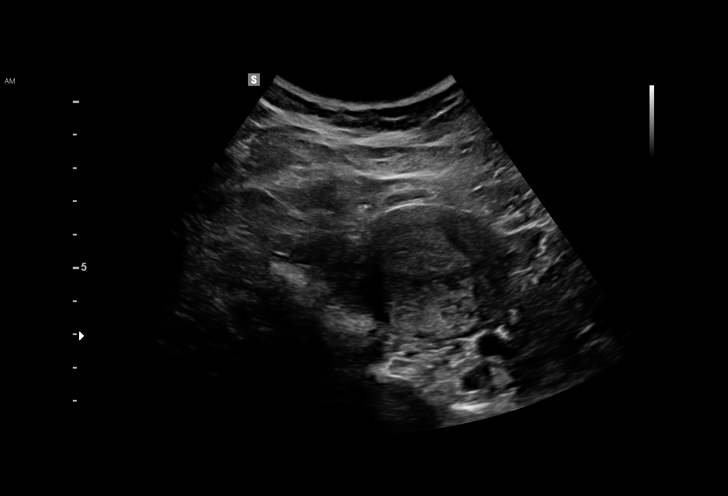
[im 32/86]
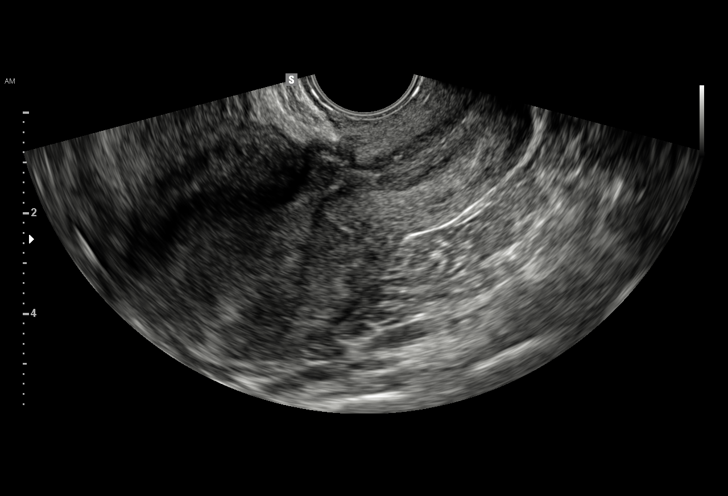
[im 38/86]
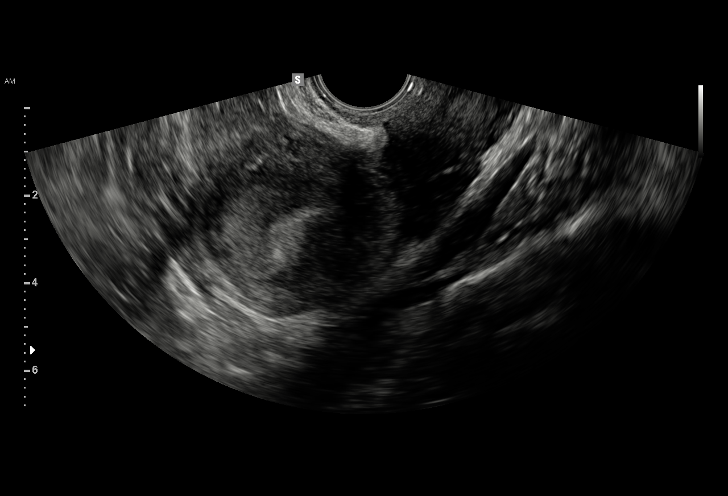
[im 45/86]
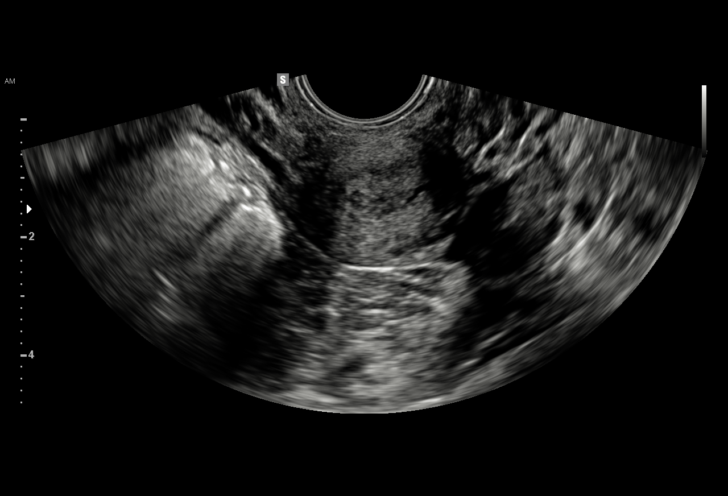
[im 48/86]
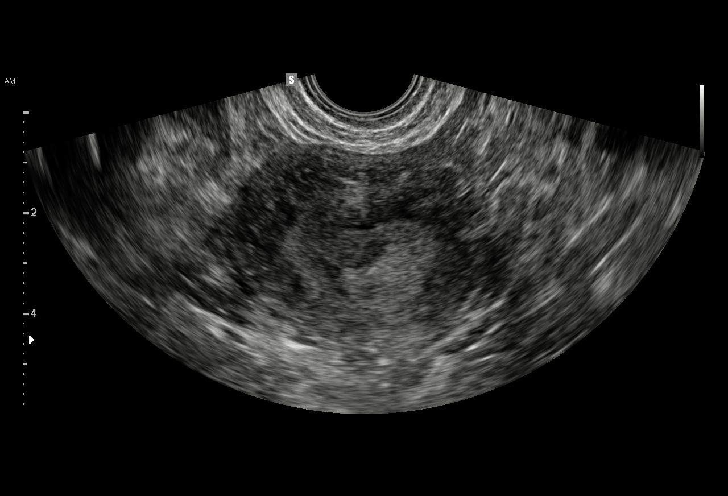
[im 54/86]
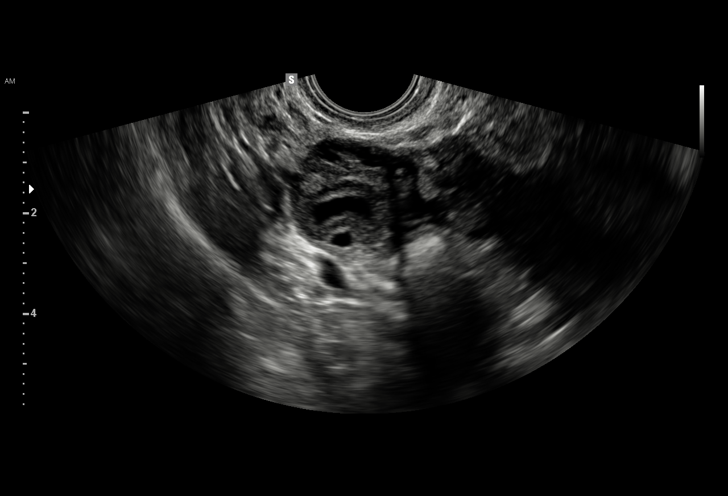
[im 60/86]
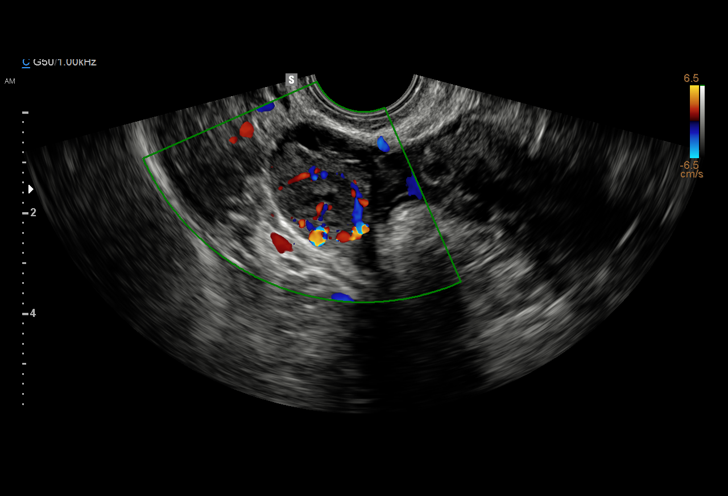
[im 67/86]
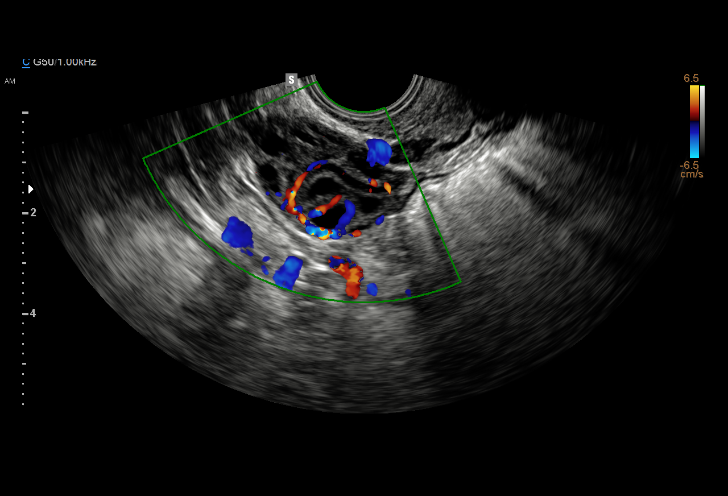
[im 73/86]
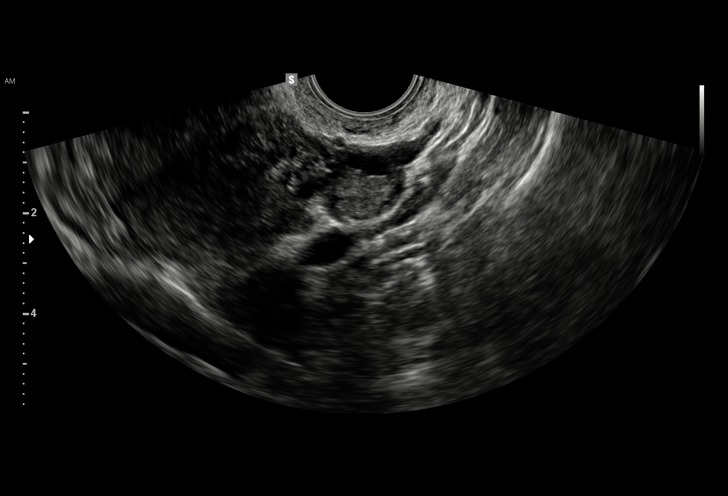
[im 79/86]
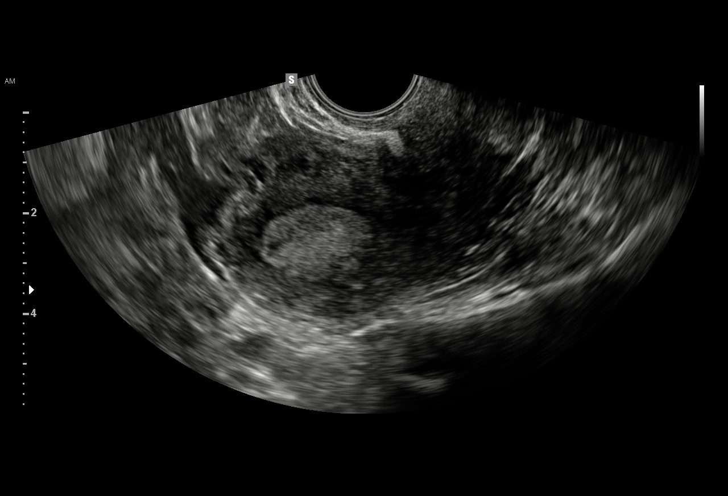
[im 86/86]
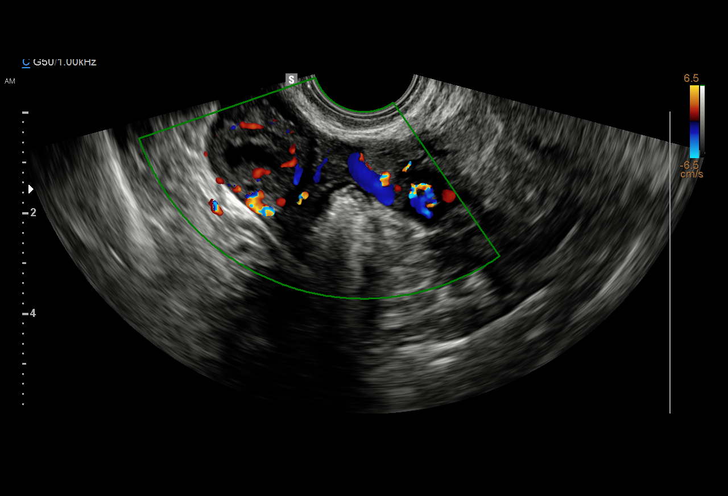

[15 of 28 positions shown; findings below may reference images not displayed]

FINDINGS: Intrauterine gestational sac: None

Maternal uterus/adnexae: Uterus measures 9.3 x 4.4 x 4.9 cm. Small
irregular shaped fluid collection within the endometrial cavity near
the fundus. Evidence for a small nabothian cyst. No definite
intrauterine gestational sac. Echotexture in the uterus is grossly
normal without large fibroids. Normal appearance of the right ovary
measuring 3.5 x 2.2 x 2.4 cm. Evidence for a corpus luteal cyst in
the right ovary. Normal appearance of the left ovary measuring 2.9 x
1.7 x 1.7 cm. No free fluid. Endometrium roughly measures 17 mm. No
findings are suggestive for an ectopic pregnancy.
IMPRESSION: Small irregular-shaped fluid collection near the uterine fundus.
Findings are suspicious but not yet definitive for failed pregnancy.
Recommend follow-up US in 10-14 days for definitive diagnosis. This
recommendation follows SRU consensus guidelines: Diagnostic Criteria
for Nonviable Pregnancy Early in the First Trimester. N Engl J Med

## 2017-12-19 NOTE — Progress Notes (Signed)
Subjective:   Patient ID: Marissa Carpenter    DOB: 1985/03/14, 32 y.o. female   MRN: [id number]  Marissa Carpenter is a 32 y.o. female with a history of GERD, HLD, obesity here for   Vaginal bleeding Patient endorses heavier periods with ParaGard placed in 04/2016.  Also noticing heavier flow with some spotting for the past 15 days which is longer cycle than she typically has.  Uses 5-6 pads in the beginning of her cycle which are soaked through.  She denies any difficulties breathing or palpitations.  Does endorse some fatigue that keeps her from exercising.  She does not take iron supplementation or prenatal vitamins.  She is eating and drinking normally.  Denies abnormal vaginal discharge without concern for STIs.  Denies fevers or vision changes.  Does endorse some pelvic and back pain during periods.  Wants her IUD strings checked.  Breast Pain Endorsing pain along the underside of her left breast for the past week.  Has noticed some blood surrounding the nipple however denies any discharge or blood from the nipple.  Typically will have pain during periods, however states this pain feels different than normal.  She is not currently breast-feeding.  She has not noticed any rashes or color changes, however notes that sometimes her breast itches.  She has not noticed any abnormal lumps or bumps.  She has never had a mammogram.  Denies any family history of breast cancer.  Review of Systems:  Per HPI.  PMFSH, medications and smoking status reviewed.  Objective:   BP 98/64   Pulse 73   Temp 98.1 F (36.7 C) (Oral)   Ht 5\' 4"  (1.626 m)   Wt 195 lb 3.2 oz (88.5 kg)   SpO2 98%   BMI 33.51 kg/m  Vitals and nursing note reviewed.  General: well nourished, well developed, in no acute distress with non-toxic appearance CV: regular rate and rhythm without murmurs, rubs, or gallops Lungs: clear to auscultation bilaterally with normal work of breathing Breasts: breasts appear normal, no suspicious masses, no  skin or nipple changes or axillary nodes. TTP along underside of left breast. GYN:  External genitalia within normal limits.  Vaginal mucosa pink, moist, normal rugae.  Nonfriable cervix without lesions, no discharge or bleeding noted on speculum exam.  IUD strings noted.  No cervical motion tenderness.  Skin: warm, dry, no rashes or lesions Extremities: warm and well perfused, normal tone MSK: ROM grossly intact, strength intact, gait normal Neuro: Alert and oriented, speech normal  Assessment & Plan:   Vaginal bleeding Heavier flow likely due to copper IUD.  Normal exam without bleeding.  Discussed alternative forms of birth control including hormonal IUD, however patient prefers to continue using ParaGard.  Will check CBC and TFTs to assess other etiologies for increased bleeding.  No current concern for STIs.  Breast pain Normal exam.  Given pain different from normal, asymmetrical and tenderness on exam, will obtain diagnostic mammogram with probable ultrasound.  Orders Placed This Encounter  Procedures  . MM Digital Diagnostic Unilat L    Standing Status:   Future    Standing Expiration Date:   02/20/2019    Order Specific Question:   Reason for Exam (SYMPTOM  OR DIAGNOSIS REQUIRED)    Answer:   breast pain    Order Specific Question:   Is the patient pregnant?    Answer:   No    Order Specific Question:   Preferred imaging location?    Answer:   [facility]  . CBC  . TSH  . T4, Free   No orders of the defined types were placed in this encounter.   Ellwood DenseAlison Jaleeyah Munce, DO PGY-2, [facility] 12/20/2017 9:39 AM

## 2017-12-20 ENCOUNTER — Encounter: Payer: Self-pay | Admitting: Family Medicine

## 2017-12-20 ENCOUNTER — Other Ambulatory Visit: Payer: Self-pay

## 2017-12-20 ENCOUNTER — Ambulatory Visit (INDEPENDENT_AMBULATORY_CARE_PROVIDER_SITE_OTHER): Payer: Self-pay | Admitting: Family Medicine

## 2017-12-20 VITALS — BP 98/64 | HR 73 | Temp 98.1°F | Ht 64.0 in | Wt 195.2 lb

## 2017-12-20 DIAGNOSIS — N644 Mastodynia: Secondary | ICD-10-CM | POA: Insufficient documentation

## 2017-12-20 DIAGNOSIS — N939 Abnormal uterine and vaginal bleeding, unspecified: Secondary | ICD-10-CM | POA: Insufficient documentation

## 2017-12-20 NOTE — Assessment & Plan Note (Signed)
Normal exam.  Given pain different from normal, asymmetrical and tenderness on exam, will obtain diagnostic mammogram with probable ultrasound.

## 2017-12-20 NOTE — Assessment & Plan Note (Signed)
Heavier flow likely due to copper IUD.  Normal exam without bleeding.  Discussed alternative forms of birth control including hormonal IUD, however patient prefers to continue using ParaGard.  Will check CBC and TFTs to assess other etiologies for increased bleeding.  No current concern for STIs.

## 2017-12-20 NOTE — Patient Instructions (Addendum)
It was great to see you!  Our plans for today:  - We are obtaining a diagnostic mammogram to better assess your breast tissue - We are checking some labs today, we will call you or send you a letter if they are abnormal.  - If you are particularly bothered by the amount you are bleeding, consider switching to a different IUD for birth control. - Make a follow up appointment to follow up diabetes.  Take care and seek immediate care sooner if you develop any concerns.   Dr. Mollie Germanyumball [facility]

## 2017-12-21 ENCOUNTER — Encounter: Payer: Self-pay | Admitting: Family Medicine

## 2017-12-21 LAB — TSH: TSH: 2.42 u[IU]/mL (ref 0.450–4.500)

## 2017-12-21 LAB — T4, FREE: Free T4: 1.24 ng/dL (ref 0.82–1.77)

## 2017-12-21 LAB — CBC
HEMATOCRIT: 37.9 % (ref 34.0–46.6)
Hemoglobin: 12.1 g/dL (ref 11.1–15.9)
MCH: 25.6 pg — AB (ref 26.6–33.0)
MCHC: 31.9 g/dL (ref 31.5–35.7)
MCV: 80 fL (ref 79–97)
PLATELETS: 261 10*3/uL (ref 150–450)
RBC: 4.72 x10E6/uL (ref 3.77–5.28)
RDW: 13.7 % (ref 12.3–15.4)
WBC: 6.1 10*3/uL (ref 3.4–10.8)

## 2017-12-27 ENCOUNTER — Other Ambulatory Visit: Payer: Self-pay | Admitting: Family Medicine

## 2017-12-27 DIAGNOSIS — N644 Mastodynia: Secondary | ICD-10-CM

## 2018-01-09 ENCOUNTER — Other Ambulatory Visit (HOSPITAL_COMMUNITY): Payer: Self-pay | Admitting: *Deleted

## 2018-01-09 DIAGNOSIS — N6452 Nipple discharge: Secondary | ICD-10-CM

## 2018-01-13 ENCOUNTER — Ambulatory Visit
Admission: RE | Admit: 2018-01-13 | Discharge: 2018-01-13 | Disposition: A | Discharge status: Home / Self Care | Payer: No Typology Code available for payment source | Source: Ambulatory Visit | Attending: Obstetrics and Gynecology | Admitting: Obstetrics and Gynecology

## 2018-01-13 ENCOUNTER — Ambulatory Visit: Payer: Medicaid Other

## 2018-01-13 ENCOUNTER — Encounter (HOSPITAL_COMMUNITY): Payer: Self-pay

## 2018-01-13 ENCOUNTER — Ambulatory Visit (HOSPITAL_COMMUNITY)
Admission: RE | Admit: 2018-01-13 | Discharge: 2018-01-13 | Disposition: A | Discharge status: Home / Self Care | Payer: Medicaid Other | Source: Ambulatory Visit | Attending: Obstetrics and Gynecology | Admitting: Obstetrics and Gynecology

## 2018-01-13 VITALS — BP 124/74 | Ht 64.0 in | Wt 196.0 lb

## 2018-01-13 DIAGNOSIS — N644 Mastodynia: Secondary | ICD-10-CM

## 2018-01-13 DIAGNOSIS — Z1239 Encounter for other screening for malignant neoplasm of breast: Secondary | ICD-10-CM

## 2018-01-13 DIAGNOSIS — N6452 Nipple discharge: Secondary | ICD-10-CM

## 2018-01-13 NOTE — Progress Notes (Signed)
Complaints of left breast pain x one month. Patient states the pain comes and goes. Patient states the pain is worse at night. Patient rates the pain at a 4-7 out of 10.  Pap Smear: Pap smear not completed today. Last Pap [facility] and normal with negative HPV. Per patient has no history of an abnormal Pap smear. Last Pap smear result is in Epic.  Physical exam: Breasts Breasts symmetrical. No skin abnormalities bilateral breasts. No nipple retraction bilateral breasts. No nipple discharge bilateral breasts. No lymphadenopathy. No lumps palpated bilateral breasts. Complaints of left lower breast pain on exam. [facility] for [facility] diagnostic mamm[facility]. Appointment scheduled for Friday, January 13, 2018 at 1300.        Pelvic/Bimanual No Pap smear completed today since last Pap smear and HPV typing was 02/28/2015. Pap smear not indicated per BCCCP guidelines.   Smoking History: Patient has never smoked.  Patient Navigation: Patient education provided. Access to services provided for patient through BCCCP program.   Breast and Cervical Cancer Risk Assessment: Patient has no family history of breast cancer, known genetic mutations, or radiation treatment to the chest before age 32. Patient has no history of cervical dysplasia, immunocompromised, or DES exposure in-utero. Breast Cancer risk assessment completed. No breast cancer risk calculated due to patient is less than 32 years old.

## 2018-01-13 NOTE — Patient Instructions (Signed)
Explained breast self awareness with Marissa Carpenter. Patient did not need a Pap smear today due to last Pap smear and HPV typing was 02/28/2015. Let her know BCCCP will cover Pap smears and HPV typing every 5 years unless has a history of abnormal Pap smears. [facility] for a diagnostic mamm[facility]. Appointment scheduled for Friday, January 13, 2018 at 1300. Patient aware of appointment and will be there. Marissa Carpenter verbalized understanding.  [name], Kathaleen Maserhristine Poll, RN 2:44 PM

## 2018-01-27 ENCOUNTER — Encounter (HOSPITAL_COMMUNITY): Payer: Self-pay | Admitting: *Deleted

## 2018-10-23 ENCOUNTER — Ambulatory Visit: Payer: No Typology Code available for payment source | Admitting: Family Medicine

## 2018-10-23 NOTE — Progress Notes (Deleted)
  Subjective:   Patient ID: Marissa Carpenter    DOB: 05-Jul-1985, 33 y.o. female   MRN: [id number]  Marissa Carpenter is a 33 y.o. female with a history of GERD, HLD, obesity here for   HEADACHE  Headache started *** days ago Pain is *** Keeps from doing:  *** Location: *** Medications tried: *** Patient thinks cause of headache might be: ***  Head trauma: *** Sudden onset: *** Previous similar headaches: *** Taking blood thinners: *** History of cancer: ***  Symptoms Nose congestion stuffiness:  *** Nausea vomiting: *** Photophobia: *** Noise sensitivity: *** Double vision or loss of vision: *** Fever: *** Neck Stiffness: *** Trouble walking or speaking: ***   Review of Systems:  Per HPI.  Medications and smoking status reviewed.  Objective:   There were no vitals taken for this visit. Vitals and nursing note reviewed.  General: well nourished, well developed, in no acute distress with non-toxic appearance HEENT: normocephalic, atraumatic, moist mucous membranes Neck: supple, non-tender without lymphadenopathy CV: regular rate and rhythm without murmurs, rubs, or gallops, no lower extremity edema Lungs: clear to auscultation bilaterally with normal work of breathing Abdomen: soft, non-tender, non-distended, no masses or organomegaly palpable, normoactive bowel sounds Skin: warm, dry, no rashes or lesions Extremities: warm and well perfused, normal tone MSK: ROM grossly intact, gait normal Neuro: Alert and oriented, speech normal  Assessment & Plan:   No problem-specific Assessment & Plan notes found for this encounter.  No orders of the defined types were placed in this encounter.  No orders of the defined types were placed in this encounter.   Rory Percy, DO PGY-3, [facility] 10/23/2018 12:10 PM

## 2018-12-05 ENCOUNTER — Telehealth (INDEPENDENT_AMBULATORY_CARE_PROVIDER_SITE_OTHER): Payer: Self-pay | Admitting: Family Medicine

## 2018-12-05 DIAGNOSIS — L309 Dermatitis, unspecified: Secondary | ICD-10-CM

## 2018-12-05 MED ORDER — TRIAMCINOLONE ACETONIDE 0.5 % EX OINT: 1.0000 "application " | TOPICAL_OINTMENT | Freq: Two times a day (BID) | CUTANEOUS | 0 refills | Status: DC

## 2018-12-05 NOTE — Progress Notes (Signed)
[place] Telemedicine Visit  Patient consented to have virtual visit. Method of visit: Video  Encounter participants: Patient: Marissa Carpenter - located at home in [place] Provider: Nuala Alpha - located at [facility] Others (if applicable): none  Chief Complaint: red spot on neck  HPI: Patient is a 33y/o old female with PMH of GERD, HLD, Obesity 2 months on the back of her neck that is red. Had something similar 5 years ago and it was diagnosed as Eczema. She states this rash is similar. Her rash is red, scaly, itchy, flaky, and dry and located just on the back of her neck, no other areas. No fever, chills, nausea, vomiting, abdominal pain, or diarrhea.   ROS: per HPI  Pertinent PMHx: Eczema  Exam:  Gen: NAD Respiratory: NWOB, speaks in full sentences Skin: erythematous, dry, scaly patch on the back of the neck  Assessment/Plan:  Eczema Consistent with eczema - Triamcinolone ointment BID for 14 days - Good skin hygiene and hydration    Time spent during visit with patient: >15 minutes  Harolyn Rutherford, DO [facility], PGY-3

## 2018-12-05 NOTE — Assessment & Plan Note (Signed)
Consistent with eczema - Triamcinolone ointment BID for 14 days - Good skin hygiene and hydration

## 2018-12-18 ENCOUNTER — Emergency Department (HOSPITAL_COMMUNITY)
Admission: EM | Admit: 2018-12-18 | Discharge: 2018-12-19 | Disposition: A | Discharge status: Home / Self Care | Payer: No Typology Code available for payment source | Attending: Emergency Medicine | Admitting: Emergency Medicine

## 2018-12-18 ENCOUNTER — Encounter (HOSPITAL_COMMUNITY): Payer: Self-pay | Admitting: Emergency Medicine

## 2018-12-18 ENCOUNTER — Other Ambulatory Visit: Payer: Self-pay

## 2018-12-18 DIAGNOSIS — E119 Type 2 diabetes mellitus without complications: Secondary | ICD-10-CM | POA: Insufficient documentation

## 2018-12-18 DIAGNOSIS — Z79899 Other long term (current) drug therapy: Secondary | ICD-10-CM | POA: Insufficient documentation

## 2018-12-18 DIAGNOSIS — F17290 Nicotine dependence, other tobacco product, uncomplicated: Secondary | ICD-10-CM | POA: Insufficient documentation

## 2018-12-18 DIAGNOSIS — R1032 Left lower quadrant pain: Secondary | ICD-10-CM | POA: Insufficient documentation

## 2018-12-18 DIAGNOSIS — R109 Unspecified abdominal pain: Secondary | ICD-10-CM

## 2018-12-18 DIAGNOSIS — Z794 Long term (current) use of insulin: Secondary | ICD-10-CM | POA: Insufficient documentation

## 2018-12-18 LAB — CBC
HCT: 37.7 % (ref 36.0–46.0)
Hemoglobin: 12.3 g/dL (ref 12.0–15.0)
MCH: 26.7 pg (ref 26.0–34.0)
MCHC: 32.6 g/dL (ref 30.0–36.0)
MCV: 81.8 fL (ref 80.0–100.0)
Platelets: 283 10*3/uL (ref 150–400)
RBC: 4.61 MIL/uL (ref 3.87–5.11)
RDW: 13.6 % (ref 11.5–15.5)
WBC: 8.3 10*3/uL (ref 4.0–10.5)
nRBC: 0 % (ref 0.0–0.2)

## 2018-12-18 LAB — URINALYSIS, ROUTINE W REFLEX MICROSCOPIC
Bilirubin Urine: NEGATIVE
Glucose, UA: NEGATIVE mg/dL
Hgb urine dipstick: NEGATIVE
Ketones, ur: NEGATIVE mg/dL
Leukocytes,Ua: NEGATIVE
Nitrite: NEGATIVE
Protein, ur: NEGATIVE mg/dL
Specific Gravity, Urine: 1.002 — ABNORMAL LOW (ref 1.005–1.030)
pH: 7 (ref 5.0–8.0)

## 2018-12-18 LAB — COMPREHENSIVE METABOLIC PANEL
ALT: 11 U/L (ref 0–44)
AST: 13 U/L — ABNORMAL LOW (ref 15–41)
Albumin: 3.6 g/dL (ref 3.5–5.0)
Alkaline Phosphatase: 51 U/L (ref 38–126)
Anion gap: 8 (ref 5–15)
BUN: 9 mg/dL (ref 6–20)
CO2: 26 mmol/L (ref 22–32)
Calcium: 9.3 mg/dL (ref 8.9–10.3)
Chloride: 105 mmol/L (ref 98–111)
Creatinine, Ser: 0.7 mg/dL (ref 0.44–1.00)
GFR calc Af Amer: >60 mL/min (ref >60)
GFR calc non Af Amer: >60 mL/min (ref >60)
Glucose, Bld: 102 mg/dL — ABNORMAL HIGH (ref 70–99)
Potassium: 3.8 mmol/L (ref 3.5–5.1)
Sodium: 139 mmol/L (ref 135–145)
Total Bilirubin: 0.4 mg/dL (ref 0.3–1.2)
Total Protein: 6.4 g/dL — ABNORMAL LOW (ref 6.5–8.1)

## 2018-12-18 LAB — I-STAT BETA HCG BLOOD, ED (MC, WL, AP ONLY): I-stat hCG, quantitative: <5 m[IU]/mL (ref <5)

## 2018-12-18 LAB — LIPASE, BLOOD: Lipase: 36 U/L (ref 11–51)

## 2018-12-18 MED ORDER — SODIUM CHLORIDE 0.9% FLUSH
3.0000 mL | Freq: Once | INTRAVENOUS | Status: AC
  Administered 2018-12-19: 3 mL via INTRAVENOUS

## 2018-12-18 NOTE — ED Triage Notes (Signed)
Pt c/o left flank pain that radiates to her abdomen. Also c/o pain with urination. Denies nausea/vomiting/diarrhea.

## 2018-12-19 ENCOUNTER — Emergency Department (HOSPITAL_COMMUNITY): Payer: No Typology Code available for payment source

## 2018-12-19 MED ORDER — SODIUM CHLORIDE 0.9 % IV BOLUS
1000.0000 mL | Freq: Once | INTRAVENOUS | Status: AC
  Administered 2018-12-19: 1000 mL via INTRAVENOUS

## 2018-12-19 MED ORDER — KETOROLAC TROMETHAMINE 30 MG/ML IJ SOLN
30.0000 mg | Freq: Once | INTRAMUSCULAR | Status: AC
  Administered 2018-12-19: 10:00:00 30 mg via INTRAVENOUS
  Filled 2018-12-19: qty 1

## 2018-12-19 NOTE — ED Notes (Signed)
Pt returned from CT °

## 2018-12-19 NOTE — ED Provider Notes (Addendum)
[facility] EMERGENCY DEPARTMENT Provider Note   CSN: [id number] Arrival date & time: 12/18/18  2211     History   Chief Complaint Chief Complaint  Patient presents with   Flank Pain    HPI Marissa Carpenter is a 33 y.o. female.     HPI Patient presents to the emergency department with with left flank pain that started yesterday.  The patient states that she had no other symptoms of flank pain.  She states that she is not had any vomiting or nausea.  Patient denies any fevers, vaginal bleeding or vaginal discharge.  The patient states that she did not take any medications prior to arrival for her symptoms.  The patient denies chest pain, shortness of breath, headache,blurred vision, neck pain, fever, cough, weakness, numbness, dizziness, anorexia, edema, nausea, vomiting, diarrhea, rash, back pain, dysuria, hematemesis, bloody stool, near syncope, or syncope. Past Medical History:  Diagnosis Date   Diabetes mellitus without complication (HCC)    Gestational diabetes    Headache(784.0)    otc med prn   Miscarriage 08/26/2010   SAB (spontaneous abortion) 2012   x 2 in 2012 - no surgery required    Patient Active Problem List   Diagnosis Date Noted   Eczema 12/05/2018   Vaginal bleeding 12/20/2017   Breast pain 12/20/2017   S/P repeat low transverse C-section 03/16/2016   Vision abnormalities 02/17/2016   Left facial pain 09/17/2015   Obesity (BMI 30-39.9) 01/15/2015   Metatarsalgia of right foot 11/18/2014   Acanthosis nigricans 08/08/2013   Low back pain on left side with sciatica 10/20/2012   Hyperlipidemia 03/04/2011   GERD (gastroesophageal reflux disease) 01/15/2011    Past Surgical History:  Procedure Laterality Date   CESAREAN SECTION N/A 05/09/2012   Procedure: Primary CESAREAN SECTION of baby boy  at 0435 APGAR 9/9;  Surgeon: Lavina Hamman, MD;  Location: [facility]S;  Service: Obstetrics;  Laterality: N/A;   CESAREAN SECTION N/A  05/18/2013   Procedure: REPEAT CESAREAN SECTION;  Surgeon: Oliver Pila, MD;  Location: [facility] ORS;  Service: Obstetrics;  Laterality: N/A;   CESAREAN SECTION N/A 03/16/2016   Procedure: REPEAT CESAREAN SECTION;  Surgeon: Huel Cote, MD;  Location: [facility] BIRTHING SUITES;  Service: Obstetrics;  Laterality: N/A;  MD requests RNFA   TONSILLECTOMY       OB History    Gravida  5   Para  3   Term  3   Preterm      AB  2   Living  3     SAB  2   TAB      Ectopic      Multiple      Live Births  3            Home Medications    Prior to Admission medications   Medication Sig Start Date End Date Taking? Authorizing Provider  acetaminophen (TYLENOL) 325 MG tablet Take 2 tablets (650 mg total) by mouth every 6 (six) hours as needed. Do not take more than 4000mg  of tylenol per day 06/11/17   Couture, Cortni S, PA-C  cephALEXin (KEFLEX) 500 MG capsule Take 1 capsule (500 mg total) by mouth 4 (four) times daily. Patient not taking: Reported on 01/13/2018 03/28/16   05/28/16, CNM  doxycycline (VIBRA-TABS) 100 MG tablet Take 1 tablet (100 mg total) by mouth 2 (two) times daily. Patient not taking: Reported on 01/13/2018 07/19/17   07/21/17, DO  ibuprofen (ADVIL,MOTRIN)  400 MG tablet Take 1 tablet (400 mg total) by mouth every 6 (six) hours as needed. Patient not taking: Reported on 01/13/2018 06/11/17   Couture, Cortni S, PA-C  lidocaine (LIDODERM) 5 % Place 1 patch onto the skin daily. Remove & Discard patch within 12 hours or as directed by MD Patient not taking: Reported on 01/13/2018 06/11/17   Couture, Cortni S, PA-C  NOVOLIN N 100 UNIT/ML injection Inject 16-22 Units into the skin 2 (two) times daily. 22 units in the morning & 16 units at night. 02/10/16   [provider]  NOVOLOG 100 UNIT/ML injection Inject 4 Units into the skin daily. 02/26/16   [provider]  Prenatal Vit-Fe Fumarate-FA (PRENATAL MULTIVITAMIN) TABS tablet Take 1  tablet by mouth daily at 12 noon.    [provider]  ranitidine (ZANTAC) 150 MG tablet Take 1 tablet (150 mg total) by mouth 2 (two) times daily. Patient not taking: Reported on 01/13/2018 08/11/15   Rasch, Anderson Malta I, NP  triamcinolone ointment (KENALOG) 0.5 % Apply 1 application topically 2 (two) times daily. 12/05/18   Nuala Alpha, DO    Family History Family History  Problem Relation Age of Onset   84 / [place] Sister    Clotting disorder Sister    Alcohol abuse Father    Diabetes Father    Hypertension Father     Social History Social History   Tobacco Use   Smoking status: Never Smoker   Smokeless tobacco: Former Systems developer   Tobacco comment: Hookah 2-3 times per week  Substance Use Topics   Alcohol use: No   Drug use: No     Allergies   Patient has no known allergies.   Review of Systems Review of Systems All other systems negative except as documented in the HPI. All pertinent positives and negatives as reviewed in the HPI.  Physical Exam Updated Vital Signs BP 109/70    Pulse 64    Temp 98.2 F (36.8 C) (Oral)    Resp 14    SpO2 100%   Physical Exam Vitals signs and nursing note reviewed.  Constitutional:      General: She is not in acute distress.    Appearance: She is well-developed.  HENT:     Head: Normocephalic and atraumatic.  Eyes:     Pupils: Pupils are equal, round, and reactive to light.  Neck:     Musculoskeletal: Normal range of motion and neck supple.  Cardiovascular:     Rate and Rhythm: Normal rate and regular rhythm.     Heart sounds: Normal heart sounds. No murmur. No friction rub. No gallop.   Pulmonary:     Effort: Pulmonary effort is normal. No respiratory distress.     Breath sounds: Normal breath sounds. No wheezing.  Abdominal:     General: Bowel sounds are normal. There is no distension.     Palpations: Abdomen is soft.     Tenderness: There is abdominal tenderness. There is no guarding or  rebound.  Skin:    General: Skin is warm and dry.     Capillary Refill: Capillary refill takes less than 2 seconds.     Findings: No erythema or rash.  Neurological:     Mental Status: She is alert and oriented to person, place, and time.     Motor: No abnormal muscle tone.     Coordination: Coordination normal.  Psychiatric:        Behavior: Behavior normal.  ED Treatments / Results  Labs (all labs ordered are listed, but only abnormal results are displayed) Labs Reviewed  COMPREHENSIVE METABOLIC PANEL - Abnormal; Notable for the following components:      Result Value   Glucose, Bld 102 (*)    Total Protein 6.4 (*)    AST 13 (*)    All other components within normal limits  URINALYSIS, ROUTINE W REFLEX MICROSCOPIC - Abnormal; Notable for the following components:   Color, Urine COLORLESS (*)    Specific Gravity, Urine 1.002 (*)    All other components within normal limits  LIPASE, BLOOD  CBC  I-STAT BETA HCG BLOOD, ED ([facility], [facility], [facility] ONLY)    EKG None  Radiology Ct Renal Stone Study  Result Date: 12/19/2018 CLINICAL DATA:  Left flank pain EXAM: CT ABDOMEN AND PELVIS WITHOUT CONTRAST TECHNIQUE: Multidetector CT imaging of the abdomen and pelvis was performed following the standard protocol without IV contrast. COMPARISON:  2017 FINDINGS: Lower chest: No acute abnormality. Hepatobiliary: No focal liver abnormality is seen. No gallstones, gallbladder wall thickening, or biliary dilatation. Pancreas: Unremarkable. No pancreatic ductal dilatation or surrounding inflammatory changes. Spleen: Normal in size without focal abnormality. Adrenals/Urinary Tract: Adrenal glands are unremarkable. No hydronephrosis. No renal or ureteral calculi identified. Bladder is decompressed and not well evaluated. Stomach/Bowel: Stomach is within normal limits. Appendix appears normal. No evidence of bowel wall thickening, distention, or inflammatory changes. Vascular/Lymphatic: No significant  vascular findings are present. No enlarged abdominal or pelvic lymph nodes. Reproductive: Intrauterine device is present.  No adnexal mass. Other: No abdominal wall hernia or abnormality. No abdominopelvic ascites. Musculoskeletal: Unremarkable. IMPRESSION: No calculi or hydronephrosis. Electronically Signed   By: Guadlupe SpanishPraneil  Patel M.D.   On: 12/19/2018 08:28    Procedures Procedures (including critical care time)  Medications Ordered in ED Medications  sodium chloride flush (NS) 0.9 % injection 3 mL (has no administration in time range)  ketorolac (TORADOL) 30 MG/ML injection 30 mg (has no administration in time range)  sodium chloride 0.9 % bolus 1,000 mL (1,000 mLs Intravenous New Bag/Given 12/19/18 0745)     Initial Impression / Assessment and Plan / ED Course  I have reviewed the triage vital signs and the nursing notes.  Pertinent labs & imaging results that were available during my care of the patient were reviewed by me and considered in my medical decision making (see chart for details).       Patient has left flank and abdominal discomfort.  The patient CT scan does not show any abnormality at this time.  Patient the patient does not have a specific cause of her flank or abdominal pain.  Due to her cultural believes the patient did not want a pelvic exam here in the emergency department.  Patient will be advised to return here for any worsening her condition.  I did advise her that this could be an evolving process that is yet to fully declare itself.  I did order an ultrasound of the patient's abdomen and pelvis looking for any signs of intrapelvic issue such as ovarian torsion or other abnormality and this was negative. Final Clinical Impressions(s) / ED Diagnoses   Final diagnoses:  None    ED Discharge Orders    None       Charlestine NightLawyer, Jordyn Hofacker, PA-C 12/19/18 0911    Charlestine NightLawyer, Hazim Treadway, PA-C 12/19/18 1040    Jacalyn LefevreHaviland, Julie, MD 12/19/18 [phone]

## 2018-12-19 NOTE — ED Notes (Addendum)
Pt transported to CT ?

## 2018-12-19 NOTE — ED Notes (Signed)
Pt transported to US

## 2018-12-19 NOTE — Discharge Instructions (Addendum)
Follow-up with a primary doctor.  Your CT scan and laboratory testing did not show any significant abnormalities.  This could be an evolving process that is yet to fully declare itself.  Return here as needed for any worsening in your condition.  Your ultrasound did not show any abnormality.

## 2018-12-19 NOTE — ED Notes (Signed)
Patient verbalizes understanding of discharge instructions. Opportunity for questioning and answers were provided. Armband removed by staff, pt discharged from ED. Ambulated out to lobby  

## 2018-12-20 ENCOUNTER — Other Ambulatory Visit: Payer: Self-pay

## 2018-12-20 DIAGNOSIS — Z20822 Contact with and (suspected) exposure to covid-19: Secondary | ICD-10-CM

## 2018-12-22 LAB — NOVEL CORONAVIRUS, NAA: SARS-CoV-2, NAA: NOT DETECTED

## 2019-01-05 ENCOUNTER — Encounter: Payer: Self-pay | Admitting: Family Medicine

## 2019-01-05 ENCOUNTER — Other Ambulatory Visit: Payer: Self-pay

## 2019-01-05 ENCOUNTER — Ambulatory Visit (INDEPENDENT_AMBULATORY_CARE_PROVIDER_SITE_OTHER): Payer: Self-pay | Admitting: Family Medicine

## 2019-01-05 VITALS — BP 110/64 | HR 84

## 2019-01-05 DIAGNOSIS — R5383 Other fatigue: Secondary | ICD-10-CM | POA: Insufficient documentation

## 2019-01-05 NOTE — Assessment & Plan Note (Signed)
Likely secondary to poor sleep hygiene.  PHQ score 7 today, although mostly for difficulty sleeping and decreased energy, so depression not likely etiology, denies current stressors.  Does have history of heavy menstrual bleeding, will obtain CBC and TSH with prior normal baseline.  Interesting that she continues to have heavy bleeding with Mirena IUD, would recommend pelvic exam +/- OCP trial if continues at follow-up.  Discussed appropriate sleep hygiene, see AVS for details.  Patient not interested in trying melatonin at this time.  Will call with results of lab work and have patient follow-up in 1 month.

## 2019-01-05 NOTE — Patient Instructions (Signed)
It was great to see you!  Our plans for today:  - We are checking some labs today, we will call you or send you a letter with these results. - Try the following to help you sleep better:  - limit naps during the day  - no screens (TV, phone, tablet, computer) at least 1-2 hours before bedtime.  - have a quiet and dark sleeping environment.  - no large meals or drinks about 1 hour before bed.  - Avoid caffeine after 3pm (this includes green tea).  - Exercise or move your body regularly every day.  - You can also try melatonin 10 mg over the counter. Take this 1-2 hours before bed. You can increase to 20mg  if this is not helpful. - If you are lying in bed for 30 mins-1 hour and aren't falling asleep, get out of bed and do something relaxing like reading (NO TV!) until you are tired.  Come back in 1 month to see how you are doing.   Take care and seek immediate care sooner if you develop any concerns.   Dr. [facility]

## 2019-01-05 NOTE — Progress Notes (Signed)
Subjective:   Patient ID: Marissa Carpenter    DOB: 10-02-1985, 33 y.o. female   MRN: [id number]  Marissa Carpenter is a 33 y.o. female with a history of GERD, eczema, HLD, obesity here for well woman/preventative visit  FATIGUE Patient complains of fatigue for the last 3 months. The Tiredness is described as sleepiness. Feels like doing activities but can't: yes Does not feel like doing things: no Feels the fatigue is due to: Vitamin deficiency  Symptoms Fever: no Sweating at night: Sometimes Weight Loss: did have intentional weight loss last year through diet and exercise Shortness of Breath: yes, sometimes Coughing up Blood: no Muscle Pain or Weakness: pain but no weakness Black or bloody Stools: no Severe Snoring or Daytime Sleepiness: no Feeling Down: sometimes Not enjoying things: No Rash: no Leg or Joint Swelling: no Chest Pain or irregular heart beat:  No Has history of heavy menstrual bleeding, currently with Mirena IUD although does report she still bleeds with this.  Denies recent stressors.  Reports she does not have a regular bedtime routine.  She goes to bed anywhere from 9-10 11 PM.  She usually wakes up around 2-3 AM when her toddler wakes up and finds it difficult to get back to sleep, usually goes back to sleep around 4-5 AM.  Wakes up at 6:30 AM with her children and will fall back asleep for a nap after they start school around 7:30 AM-10 AM.  Sometimes she will also take a nap in the afternoon around 3 or 5 PM.  Occasionally she will have green tea in the afternoons.  She often watches TV or looks at her phone prior to bed.  She has not tried any medications for sleep.  Review of Systems:  Per HPI.  Medications and smoking status reviewed.  Objective:   BP 110/64   Pulse 84   LMP 12/24/2018 (Exact Date)   SpO2 99%   Breastfeeding No  Vitals and nursing note reviewed.  General: Overweight female, in no acute distress with non-toxic appearance CV: regular rate and  rhythm without murmurs, rubs, or gallops Lungs: clear to auscultation bilaterally with normal work of breathing Skin: warm, dry, no rashes or lesions Extremities: warm and well perfused, normal tone MSK: ROM grossly intact, gait normal Neuro: Alert and oriented, speech normal Psych: Appropriately dressed and well-groomed, no flight of ideas with tangential thought process.  Depression screen [facility] 2/9 01/05/2019 12/20/2017 07/19/2017  Decreased Interest 0 0 0  Down, Depressed, Hopeless 1 0 0  PHQ - 2 Score 1 0 0  Altered sleeping 3 - -  Tired, decreased energy 3 - -  Change in appetite 0 - -  Feeling bad or failure about yourself  0 - -  Trouble concentrating 0 - -  Moving slowly or fidgety/restless 0 - -  Suicidal thoughts 0 - -  PHQ-9 Score 7 - -    Assessment & Plan:   Fatigue Likely secondary to poor sleep hygiene.  PHQ score 7 today, although mostly for difficulty sleeping and decreased energy, so depression not likely etiology, denies current stressors.  Does have history of heavy menstrual bleeding, will obtain CBC and TSH with prior normal baseline.  Interesting that she continues to have heavy bleeding with Mirena IUD, would recommend pelvic exam +/- OCP trial if continues at follow-up.  Discussed appropriate sleep hygiene, see AVS for details.  Patient not interested in trying melatonin at this time.  Will call with results of lab work and have  patient follow-up in 1 month.  Orders Placed This Encounter  Procedures  . CBC  . Basic Metabolic Panel  . TSH   No orders of the defined types were placed in this encounter.   Rory Percy, DO PGY-3, [facility] 01/05/2019 11:47 AM

## 2019-01-06 LAB — BASIC METABOLIC PANEL
BUN/Creatinine Ratio: 19 (ref 9–23)
BUN: 15 mg/dL (ref 6–20)
CO2: 22 mmol/L (ref 20–29)
Calcium: 9.2 mg/dL (ref 8.7–10.2)
Chloride: 103 mmol/L (ref 96–106)
Creatinine, Ser: 0.77 mg/dL (ref 0.57–1.00)
GFR calc Af Amer: 117 mL/min/{1.73_m2} (ref >59)
GFR calc non Af Amer: 102 mL/min/{1.73_m2} (ref >59)
Glucose: 101 mg/dL — ABNORMAL HIGH (ref 65–99)
Potassium: 4.2 mmol/L (ref 3.5–5.2)
Sodium: 141 mmol/L (ref 134–144)

## 2019-01-06 LAB — CBC
Hematocrit: 37.8 % (ref 34.0–46.6)
Hemoglobin: 12.7 g/dL (ref 11.1–15.9)
MCH: 26.7 pg (ref 26.6–33.0)
MCHC: 33.6 g/dL (ref 31.5–35.7)
MCV: 79 fL (ref 79–97)
Platelets: 295 10*3/uL (ref 150–450)
RBC: 4.76 x10E6/uL (ref 3.77–5.28)
RDW: 13.9 % (ref 11.7–15.4)
WBC: 6.2 10*3/uL (ref 3.4–10.8)

## 2019-01-06 LAB — TSH: TSH: 1.31 u[IU]/mL (ref 0.450–4.500)

## 2019-01-08 ENCOUNTER — Encounter: Payer: Self-pay | Admitting: *Deleted

## 2019-02-07 ENCOUNTER — Ambulatory Visit: Payer: Medicaid Other | Attending: Internal Medicine

## 2019-02-07 DIAGNOSIS — Z20822 Contact with and (suspected) exposure to covid-19: Secondary | ICD-10-CM

## 2019-02-08 LAB — NOVEL CORONAVIRUS, NAA: SARS-CoV-2, NAA: NOT DETECTED

## 2019-10-23 NOTE — Progress Notes (Signed)
    SUBJECTIVE:   CHIEF COMPLAINT / HPI:   Wound Drainage with abdominal pain C-section in 2018 5 days ago noted watery discharge and pain in the right side of her c-section scar She put alcohol on the area and it burned Reports a subjective fever, but did not check  Fatigued for two days  Pain is no longer there It is still draining fluid She is having pain across her lower abdomen into the back Having occasional dysuria No hematuria No changes in vaginal discharge, has an IUD in place (since 2018)  PERTINENT  PMH / PSH: GERD, HLD, eczema, obesity  OBJECTIVE:   BP 114/70   Pulse 79   Wt 210 lb 6.4 oz (95.4 kg)   SpO2 100%   BMI 36.12 kg/m    Physical Exam:  General: 34 y.o. female in NAD Lungs: Breathing comfortably on room air Abdomen: Soft, tenderness palpation of the suprapubic region, left CVA tenderness, low transverse C-section scar well-healed without open wound or drainage noted, no erythema surrounding scar, no tenderness to palpation of scar Skin: warm and dry Extremities: No edema  Results for orders placed or performed in visit on 10/24/19 (from the past 24 hour(s))  POCT urinalysis dipstick     Status: Abnormal   Collection Time: 10/24/19  9:15 AM  Result Value Ref Range   Color, UA yellow yellow   Clarity, UA cloudy (A) clear   Glucose, UA negative negative mg/dL   Bilirubin, UA negative negative   Ketones, POC UA negative negative mg/dL   Spec Grav, UA 9.417 4.081 - 1.025   Blood, UA negative negative   pH, UA 5.0 5.0 - 8.0   Protein Ur, POC negative negative mg/dL   Urobilinogen, UA 0.2 0.2 or 1.0 E.U./dL   Nitrite, UA Negative Negative   Leukocytes, UA Small (1+) (A) Negative     ASSESSMENT/PLAN:   Acute cystitis without hematuria Discussed with patient that her C-section wound appears well-healed.  Drainage could have been occurring from transient folliculitis that has now resolved.  Do not see any signs of infection at this time and there  is no opening of the skin.  She does have symptoms consistent with urinary tract infection, UA significant for 1+ leukocytes only.  We will send for culture.  We will go ahead and treat with Keflex 500 mg 4 times daily x5 days, advised patient to return to care if no improvement.  Also advised that if she has drainage again, to come in right away.  Advised to eat yogurt while taking antibiotic.     Unknown Jim, DO [facility]

## 2019-10-24 ENCOUNTER — Other Ambulatory Visit: Payer: Self-pay

## 2019-10-24 ENCOUNTER — Ambulatory Visit (INDEPENDENT_AMBULATORY_CARE_PROVIDER_SITE_OTHER): Payer: Self-pay | Admitting: Family Medicine

## 2019-10-24 VITALS — BP 114/70 | HR 79 | Wt 210.4 lb

## 2019-10-24 DIAGNOSIS — N3000 Acute cystitis without hematuria: Secondary | ICD-10-CM | POA: Insufficient documentation

## 2019-10-24 DIAGNOSIS — M545 Low back pain, unspecified: Secondary | ICD-10-CM

## 2019-10-24 LAB — POCT URINALYSIS DIP (MANUAL ENTRY)
Bilirubin, UA: NEGATIVE
Blood, UA: NEGATIVE
Glucose, UA: NEGATIVE mg/dL
Ketones, POC UA: NEGATIVE mg/dL
Nitrite, UA: NEGATIVE
Protein Ur, POC: NEGATIVE mg/dL
Spec Grav, UA: 1.025 (ref 1.010–1.025)
Urobilinogen, UA: 0.2 E.U./dL
pH, UA: 5 (ref 5.0–8.0)

## 2019-10-24 MED ORDER — CEPHALEXIN 500 MG PO CAPS: 500.0000 mg | ORAL_CAPSULE | Freq: Four times a day (QID) | ORAL | 0 refills | Status: AC

## 2019-10-24 NOTE — Patient Instructions (Addendum)
Thank you for coming to see me today. It was a pleasure. Today we talked about:   You likely have a urinary tract infection.  Please take keflex four times a day for 5 days.  Return if no improvement in your symptoms, or if your pain worsens, you have fevers, you are vomiting.  Please follow-up with PCP as scheduled.  If you have any questions or concerns, please do not hesitate to call the office at [phone].  Best,   Luis Abed, DO

## 2019-10-24 NOTE — Assessment & Plan Note (Signed)
Discussed with patient that her C-section wound appears well-healed.  Drainage could have been occurring from transient folliculitis that has now resolved.  Do not see any signs of infection at this time and there is no opening of the skin.  She does have symptoms consistent with urinary tract infection, UA significant for 1+ leukocytes only.  We will send for culture.  We will go ahead and treat with Keflex 500 mg 4 times daily x5 days, advised patient to return to care if no improvement.  Also advised that if she has drainage again, to come in right away.  Advised to eat yogurt while taking antibiotic.

## 2019-11-15 ENCOUNTER — Other Ambulatory Visit: Payer: Self-pay

## 2019-11-15 ENCOUNTER — Encounter (HOSPITAL_COMMUNITY): Payer: Self-pay

## 2019-11-15 ENCOUNTER — Ambulatory Visit (HOSPITAL_COMMUNITY)
Admission: EM | Admit: 2019-11-15 | Discharge: 2019-11-15 | Disposition: A | Discharge status: Home / Self Care | Payer: Medicaid Other | Attending: Family Medicine | Admitting: Family Medicine

## 2019-11-15 DIAGNOSIS — R112 Nausea with vomiting, unspecified: Secondary | ICD-10-CM

## 2019-11-15 DIAGNOSIS — N939 Abnormal uterine and vaginal bleeding, unspecified: Secondary | ICD-10-CM

## 2019-11-15 LAB — POCT URINALYSIS DIPSTICK, ED / UC
Bilirubin Urine: NEGATIVE
Glucose, UA: NEGATIVE mg/dL
Ketones, ur: NEGATIVE mg/dL
Leukocytes,Ua: NEGATIVE
Nitrite: NEGATIVE
Protein, ur: NEGATIVE mg/dL
Specific Gravity, Urine: >=1.03 (ref 1.005–1.030)
Urobilinogen, UA: 0.2 mg/dL (ref 0.0–1.0)
pH: 6.5 (ref 5.0–8.0)

## 2019-11-15 LAB — POC URINE PREG, ED: Preg Test, Ur: NEGATIVE

## 2019-11-15 MED ORDER — NAPROXEN SODIUM 550 MG PO TABS: 550.0000 mg | ORAL_TABLET | Freq: Two times a day (BID) | ORAL | 0 refills | Status: DC

## 2019-11-15 MED ORDER — ONDANSETRON HCL 4 MG PO TABS: 4.0000 mg | ORAL_TABLET | Freq: Four times a day (QID) | ORAL | 0 refills | Status: DC

## 2019-11-15 NOTE — ED Provider Notes (Signed)
[facility]    CSN: [id number] Arrival date & time: 11/15/19  1218      History   Chief Complaint Chief Complaint  Patient presents with  . Abdominal Pain    since this morning  . Nausea    since this morning    HPI Marissa Carpenter is a 34 y.o. female.   HPI  Patient has good [place] skills.  No interpreter is needed she states that she had a normal menstrual period that stopped about 5 days ago.  She states that she started bleeding again yesterday.  She states yesterday she had some heavy bleeding today it slowed down.  She has some clots.  She states she also has some nausea.  One episode of vomiting.  Feels somewhat weak.  Uncertain why she is having irregular bleeding, nausea tired feeling.  She states she has had Covid vaccinations.  Has not been exposed to anyone who is sick.  Has 3 children at home.  No gastroenteritis or vomiting, with children.  She has not eaten anything that did not agree with her.  She is certain that she is not pregnant.  She has an IUD for years.  No vaginal discharge.  No urinary symptoms.  No loose bowels.  No fever chills  Past Medical History:  Diagnosis Date  . Diabetes mellitus without complication (HCC)   . Gestational diabetes   . Headache(784.0)    otc med prn  . Miscarriage 08/26/2010  . SAB (spontaneous abortion) 2012   x 2 in 2012 - no surgery required    Patient Active Problem List   Diagnosis Date Noted  . Acute cystitis without hematuria 10/24/2019  . Fatigue 01/05/2019  . Eczema 12/05/2018  . Vaginal bleeding 12/20/2017  . Breast pain 12/20/2017  . S/P repeat low transverse C-section 03/16/2016  . Vision abnormalities 02/17/2016  . Left facial pain 09/17/2015  . Obesity (BMI 30-39.9) 01/15/2015  . Metatarsalgia of right foot 11/18/2014  . Acanthosis nigricans 08/08/2013  . Low back pain on left side with sciatica 10/20/2012  . Hyperlipidemia 03/04/2011  . GERD (gastroesophageal reflux disease) 01/15/2011     Past Surgical History:  Procedure Laterality Date  . CESAREAN SECTION N/A 05/09/2012   Procedure: Primary CESAREAN SECTION of baby boy  at 0435 APGAR 9/9;  Surgeon: Lavina Hamman, MD;  Location: [facility];  Service: Obstetrics;  Laterality: N/A;  . CESAREAN SECTION N/A 05/18/2013   Procedure: REPEAT CESAREAN SECTION;  Surgeon: Oliver Pila, MD;  Location: [facility];  Service: Obstetrics;  Laterality: N/A;  . CESAREAN SECTION N/A 03/16/2016   Procedure: REPEAT CESAREAN SECTION;  Surgeon: Huel Cote, MD;  Location: [facility]THING SU[facility];  Service: Obstetrics;  Laterality: N/A;  MD requests RNFA  . TONSILLECTOMY      OB History    Gravida  5   Para  3   Term  3   Preterm      AB  2   Living  3     SAB  2   TAB      Ectopic      Multiple      Live Births  3            Home Medications    Prior to Admission medications   Medication Sig Start Date End Date Taking? Authorizing Provider  acetaminophen (TYLENOL) 325 MG tablet Take 2 tablets (650 mg total) by mouth every 6 (six) hours as needed. Do not take more than  4000mg  of tylenol per day 06/11/17  Yes Couture, Cortni S, PA-C  ibuprofen (ADVIL,MOTRIN) 400 MG tablet Take 1 tablet (400 mg total) by mouth every 6 (six) hours as needed. 06/11/17  Yes Couture, Cortni S, PA-C  paragard intrauterine copper IUD IUD ParaGard T 380A 380 square mm intrauterine device  Take 1 device by intrauterine route.   Yes [provider]  levonorgestrel (MIRENA, 52 MG,) 20 MCG/24HR IUD Mirena 20 mcg/24 hours (6 yrs) 52 mg intrauterine device  Take 1 device by intrauterine route.  11/15/19 Yes [provider]  naproxen sodium (ANAPROX DS) 550 MG tablet Take 1 tablet (550 mg total) by mouth 2 (two) times daily with a meal. 11/15/19   11/17/19, MD  ondansetron (ZOFRAN) 4 MG tablet Take 1-2 tablets (4-8 mg total) by mouth every 6 (six) hours. As needed nausea 11/15/19   11/17/19, MD  Prenatal Vit-Fe  Fumarate-FA (PRENATAL MULTIVITAMIN) TABS tablet Take 1 tablet by mouth daily at 12 noon.    [provider]  triamcinolone ointment (KENALOG) 0.5 % Apply 1 application topically 2 (two) times daily. 12/05/18   13/10/20, DO  ranitidine (ZANTAC) 150 MG tablet Take 1 tablet (150 mg total) by mouth 2 (two) times daily. Patient not taking: Reported on 01/13/2018 08/11/15 11/15/19  Rasch, 11/17/19, NP    Family History Family History  Problem Relation Age of Onset  . Miscarriages / Stillbirths Sister   . Clotting disorder Sister   . Alcohol abuse Father   . Diabetes Father   . Hypertension Father     Social History Social History   Tobacco Use  . Smoking status: Never Smoker  . Smokeless tobacco: Former Harolyn Rutherford  . Tobacco comment: Hookah 2-3 times per week  Vaping Use  . Vaping Use: Never used  Substance Use Topics  . Alcohol use: No  . Drug use: No     Allergies   Patient has no known allergies.   Review of Systems Review of Systems  See HPI Physical Exam Triage Vital Signs ED Triage Vitals  Enc Vitals Group     BP 11/15/19 1411 115/64     Pulse Rate 11/15/19 1411 69     Resp 11/15/19 1411 17     Temp 11/15/19 1411 98.5 F (36.9 C)     Temp Source 11/15/19 1411 Oral     SpO2 11/15/19 1411 100 %     Weight --      Height --      Head Circumference --      Peak Flow --      Pain Score 11/15/19 1413 5     Pain Loc --      Pain Edu? --      Excl. in GC? --    No data found.  Updated Vital Signs BP 115/64 (BP Location: Right Arm)   Pulse 69   Temp 98.5 F (36.9 C) (Oral)   Resp 17   SpO2 100%      Physical Exam Constitutional:      General: She is not in acute distress.    Appearance: She is well-developed.     Comments: Pleasant.  Overweight  HENT:     Head: Normocephalic and atraumatic.     Mouth/Throat:     Comments: Mask is in place Eyes:     Conjunctiva/sclera: Conjunctivae normal.     Pupils: Pupils are equal, round, and  reactive to light.  Cardiovascular:  Rate and Rhythm: Normal rate.  Pulmonary:     Effort: Pulmonary effort is normal. No respiratory distress.  Abdominal:     General: Bowel sounds are normal. There is no distension.     Palpations: Abdomen is soft.     Comments: No abdominal tenderness.  No CVA tenderness.  Musculoskeletal:        General: Normal range of motion.     Cervical back: Normal range of motion.  Skin:    General: Skin is warm and dry.     Comments: Small infection in the mons pubis, localized to skin.  About 1 cm across.  Neurological:     General: No focal deficit present.     Mental Status: She is alert.  Psychiatric:        Behavior: Behavior normal.      UC Treatments / Results  Labs (all labs ordered are listed, but only abnormal results are displayed) Labs Reviewed  POCT URINALYSIS DIPSTICK, ED / UC - Abnormal; Notable for the following components:      Result Value   Hgb urine dipstick MODERATE (*)    All other components within normal limits  POC URINE PREG, ED    EKG   Radiology No results found.  Procedures Procedures (including critical care time)  Medications Ordered in UC Medications - No data to display  Initial Impression / Assessment and Plan / UC Course  I have reviewed the triage vital signs and the nursing notes.  Pertinent labs & imaging results that were available during my care of the patient were reviewed by me and considered in my medical decision making (see chart for details).     Reviewed with patient that irregular menstrual bleeding is not unusual.  She is follow-up with her primary care doctor.  Her weak feeling and vomiting could be from a virus.  She is not pregnant.  Urine is negative except for blood which is not unexpected.  We will treat her symptomatically. Final Clinical Impressions(s) / UC Diagnoses   Final diagnoses:  Abnormal vaginal bleeding  Non-intractable vomiting with nausea, unspecified vomiting  type     Discharge Instructions     The urine test shows no infection Take the naproxen 2 x a day with food This will help with the abdominal pain and irregular bleeding Take the zofran as needed for nausea Call your doctor if not improving by  Monday    ED Prescriptions    Medication Sig Dispense Auth. Provider   naproxen sodium (ANAPROX DS) 550 MG tablet Take 1 tablet (550 mg total) by mouth 2 (two) times daily with a meal. 14 tablet Eustace Moore, MD   ondansetron (ZOFRAN) 4 MG tablet Take 1-2 tablets (4-8 mg total) by mouth every 6 (six) hours. As needed nausea 12 tablet Eustace Moore, MD     PDMP not reviewed this encounter.   Eustace Moore, MD 11/15/19 [phone]

## 2019-11-15 NOTE — ED Triage Notes (Signed)
PT states she had her menstrual cycle and stopped and then started bleeding 2 days ago. Pt also complains of N/V since this am. Pt is aox4 and ambulatory.

## 2019-11-15 NOTE — Discharge Instructions (Signed)
The urine test shows no infection Take the naproxen 2 x a day with food This will help with the abdominal pain and irregular bleeding Take the zofran as needed for nausea Call your doctor if not improving by  Monday

## 2020-04-16 ENCOUNTER — Other Ambulatory Visit: Payer: Self-pay

## 2020-04-16 ENCOUNTER — Ambulatory Visit (INDEPENDENT_AMBULATORY_CARE_PROVIDER_SITE_OTHER): Payer: Self-pay | Admitting: Family Medicine

## 2020-04-16 VITALS — BP 100/59 | HR 68 | Ht 64.0 in | Wt 218.8 lb

## 2020-04-16 DIAGNOSIS — R21 Rash and other nonspecific skin eruption: Secondary | ICD-10-CM

## 2020-04-16 DIAGNOSIS — Z23 Encounter for immunization: Secondary | ICD-10-CM

## 2020-04-16 MED ORDER — TRIAMCINOLONE ACETONIDE 0.1 % EX OINT: 1.0000 "application " | TOPICAL_OINTMENT | Freq: Two times a day (BID) | CUTANEOUS | 0 refills | Status: DC

## 2020-04-16 NOTE — Progress Notes (Signed)
   SUBJECTIVE:   CHIEF COMPLAINT / HPI:   Chief Complaint  Patient presents with  . Carpenter under ear    Pt stated that the Carpenter has been there for a week.  Marissa Carpenter Headache     Marissa Carpenter is a 35 y.o. female here for Carpenter under her ear. States the bumps is painful and getting bigger. Also has a bumps in her arm pits.  She reports these sx started about a week ago. She has had congestion, rhinorrhea. Denies cough, fever. Endorses intermittent headache. She applied rose water without relief. No one else has similar rash.     PERTINENT  PMH / PSH: reviewed and updated as appropriate   OBJECTIVE:   BP (!) 100/59   Pulse 68   Ht 5\' 4"  (1.626 m)   Wt 218 lb 12.8 oz (99.2 kg)   SpO2 98%   BMI 37.56 kg/m    GEN: well appearing female in no acute distress  CVS: well perfused  RESP: speaking in full sentences without pause, no respiratory distress  Neuro: CN 2-12 grossly intact, normal speech, no focal abnormality SKIN: see images below.             ASSESSMENT/PLAN:   Rash Pt with itchy raised papular patchy rase in bilateral axilla. There is some scaling to the area. No likely scabies or bed bugs. Pt with hx of eczema. Possibly contact vs irritant dermatitis.Will treat with topical steroids. Neck with papules do not appear congruent to axillary rash. Likely acne. Follow up in 2 weeks if not improving.       Influenza vaccine given.   , DO PGY-2, [facility] 04/16/2020

## 2020-04-16 NOTE — Patient Instructions (Signed)
It was great seeing you today!   I'd like to see you back 2 weeks on 04/30/20 at 11:10 AM but if you need to be seen earlier than that for any new issues we're happy to fit you in, just give [place] a call!   Stop by the pharmacy to pick up your steroid cream. Apply twice daily for the next two weeks.    If you have questions or concerns please do not hesitate to call at [phone].  Dr. Katherina Right [facility]

## 2020-04-17 NOTE — Assessment & Plan Note (Signed)
Pt with itchy raised papular patchy rase in bilateral axilla. There is some scaling to the area. No likely scabies or bed bugs. Pt with hx of eczema. Possibly contact vs irritant dermatitis.Will treat with topical steroids. Neck with papules do not appear congruent to axillary rash. Likely acne. Follow up in 2 weeks if not improving.

## 2020-04-30 ENCOUNTER — Ambulatory Visit (HOSPITAL_COMMUNITY)
Admission: RE | Admit: 2020-04-30 | Discharge: 2020-04-30 | Disposition: A | Discharge status: Home / Self Care | Payer: Medicaid Other | Source: Ambulatory Visit | Attending: Family Medicine | Admitting: Family Medicine

## 2020-04-30 ENCOUNTER — Ambulatory Visit (INDEPENDENT_AMBULATORY_CARE_PROVIDER_SITE_OTHER): Payer: Self-pay | Admitting: Family Medicine

## 2020-04-30 ENCOUNTER — Other Ambulatory Visit: Payer: Self-pay

## 2020-04-30 VITALS — BP 100/60 | HR 78 | Ht 64.0 in | Wt 220.4 lb

## 2020-04-30 DIAGNOSIS — R079 Chest pain, unspecified: Secondary | ICD-10-CM

## 2020-04-30 DIAGNOSIS — R0789 Other chest pain: Secondary | ICD-10-CM

## 2020-04-30 DIAGNOSIS — R21 Rash and other nonspecific skin eruption: Secondary | ICD-10-CM

## 2020-04-30 NOTE — Progress Notes (Signed)
    SUBJECTIVE:   CHIEF COMPLAINT / HPI:   Rash, resolved:  Pleasant patient following up for a bump under her ear as well as bumps in her underarms.  Bumps in underarm were thought to most likely be contact vs irritant dermatitis, bumps on neck thought to most likely be acne. Patient was treated with triamcinolone 0.1%, reports today that the rash is gone and resolved.   Non-cardiac chest pain: Patient reports sharp pain around her heart and in the left side of her chest that has been occurring on and off for the last 4 days and has slowly been getting worse.  She reports its worse when she breathes and has pain in her left upper chest going towards her left arm.  The pain occurs at rest and with activity. She is a nonsmoker. The pain is reproducible on physical exam. Pain does not change with positioning (leaning back or sitting forward). EKG in the office while patient is experiencing the pain is normal sinus rhythm without abnormality.   PERTINENT  PMH / PSH:  Patient Active Problem List   Diagnosis Date Noted  . Chest pain 05/08/2020  . Non-cardiac chest pain 05/08/2020  . Acute cystitis without hematuria 10/24/2019  . Fatigue 01/05/2019  . Eczema 12/05/2018  . Vaginal bleeding 12/20/2017  . Breast pain 12/20/2017  . S/P repeat low transverse C-section 03/16/2016  . Vision abnormalities 02/17/2016  . Obesity (BMI 30-39.9) 01/15/2015  . Metatarsalgia of right foot 11/18/2014  . Acanthosis nigricans 08/08/2013  . Low back pain on left side with sciatica 10/20/2012  . Hyperlipidemia 03/04/2011  . GERD (gastroesophageal reflux disease) 01/15/2011  . Rash 01/15/2011    OBJECTIVE:   BP 100/60   Pulse 78   Ht 5\' 4"  (1.626 m)   Wt 220 lb 6 oz (100 kg)   SpO2 99%   BMI 37.83 kg/m    Physical exam: General: nontoxic appearing Respiratory: CTA bilaterally, comfortable work of breathing on room air Cardio: RRR, S1S2 present, no murmurs appreciated Chest: tenderness to  palpation of left side of stress, good excursion of ribs with inhalation and exhalation although patient expresses pain with taking deep breaths in.  ASSESSMENT/PLAN:   Non-cardiac chest pain Patient experiencing noncardiac chest pain possibly due to costochondritis in setting of recent viral URI, possibly precordial catch syndrome.  Patient's heart score 0.  EKG normal in the office, reassuring. -Patient reassured -Patient can take Tylenol/ibuprofen together 3 times daily to reduce pain -Patient can also apply warm compress to left side of chest to help reduce symptoms -Patient given strict return precautions   Rash Patient's rash to underarm, and neck are both resolved/improved.  Patient has been using triamcinolone. -Continue to monitor -Follow-up if comes back     , DO [facility]

## 2020-04-30 NOTE — Patient Instructions (Signed)
Thank you for coming in to see [place] today! Please see below to review our plan for today's visit:  1. I'm glad your rash is better. Please come back to be seen if it returns.  2. You are experiencing noncardiac chest pain. Should your symptoms change or worsen either come see [place] in the office or be evaluated in the [facility] Emergency Department.   Please call the clinic at [phone] if your symptoms worsen or you have any concerns. It was our pleasure to serve you!   Dr. Peggyann Shoals [facility]

## 2020-05-08 ENCOUNTER — Encounter: Payer: Self-pay | Admitting: Family Medicine

## 2020-05-08 DIAGNOSIS — R0789 Other chest pain: Secondary | ICD-10-CM | POA: Insufficient documentation

## 2020-05-08 DIAGNOSIS — R079 Chest pain, unspecified: Secondary | ICD-10-CM | POA: Insufficient documentation

## 2020-05-08 NOTE — Assessment & Plan Note (Addendum)
Patient experiencing noncardiac chest pain possibly due to costochondritis in setting of recent viral URI, possibly precordial catch syndrome.  Patient's heart score 0.  EKG normal in the office, reassuring. -Patient reassured -Patient can take Tylenol/ibuprofen together 3 times daily to reduce pain -Patient can also apply warm compress to left side of chest to help reduce symptoms -Patient given strict return precautions

## 2020-05-08 NOTE — Assessment & Plan Note (Signed)
Patient's rash to underarm, and neck are both resolved/improved.  Patient has been using triamcinolone. -Continue to monitor -Follow-up if comes back

## 2020-05-27 ENCOUNTER — Ambulatory Visit: Payer: Medicaid Other | Admitting: Family Medicine

## 2020-05-27 NOTE — Progress Notes (Deleted)
    SUBJECTIVE:   CHIEF COMPLAINT / HPI:   ***  PERTINENT  PMH / PSH: ***  OBJECTIVE:   There were no vitals taken for this visit.  ***  ASSESSMENT/PLAN:   No problem-specific Assessment & Plan notes found for this encounter.     Shirlean Mylar, MD [facility]   {    This will disappear when note is signed, click to select method of visit    :1}

## 2020-10-07 ENCOUNTER — Ambulatory Visit (INDEPENDENT_AMBULATORY_CARE_PROVIDER_SITE_OTHER): Payer: Self-pay | Admitting: Family Medicine

## 2020-10-07 ENCOUNTER — Encounter: Payer: Self-pay | Admitting: Family Medicine

## 2020-10-07 ENCOUNTER — Other Ambulatory Visit: Payer: Self-pay

## 2020-10-07 VITALS — BP 112/58 | HR 79 | Ht 64.0 in | Wt 222.8 lb

## 2020-10-07 DIAGNOSIS — M5442 Lumbago with sciatica, left side: Secondary | ICD-10-CM

## 2020-10-07 DIAGNOSIS — N23 Unspecified renal colic: Secondary | ICD-10-CM

## 2020-10-07 LAB — POCT URINALYSIS DIP (MANUAL ENTRY)
Bilirubin, UA: NEGATIVE
Blood, UA: NEGATIVE
Glucose, UA: NEGATIVE mg/dL
Ketones, POC UA: NEGATIVE mg/dL
Nitrite, UA: NEGATIVE
Protein Ur, POC: NEGATIVE mg/dL
Spec Grav, UA: 1.02 (ref 1.010–1.025)
Urobilinogen, UA: 0.2 E.U./dL
pH, UA: 5.5 (ref 5.0–8.0)

## 2020-10-07 LAB — POCT UA - MICROSCOPIC ONLY

## 2020-10-07 LAB — POCT URINE PREGNANCY: Preg Test, Ur: NEGATIVE

## 2020-10-07 NOTE — Progress Notes (Signed)
     SUBJECTIVE:   CHIEF COMPLAINT / HPI:   Marissa Carpenter is a 35 y.o. female presents for lower back pain   Left lower back pain  Onset: 1 week ago  Location/Radiation: left leg  Duration: intermittent   Character: unable to describe  Aggravating Factors: worse at night   Alleviating Factors: none , has not taken analgesia Timing: at night  Severity: 8/10  Denies Denies history of trauma, history of prolonged steroid use, bowel or bladder incontinence, urinary retention, numbness or tingling in extremities or saddle anesthesia, weakness in extremities, fevers, chills, IV drug use, hemodialysis, hx cancer, changes in weight, night sweats. No history of osteoporosis.   Dysuria for 1 week. Denies fevers, lower abdominal pain, dysuria, frequency, urgency or hematuria.  Hx of renal colic. Pt has IUD in.   Occupation: No heavy lifting, repetitive vibrations, bending or twisting motions.   LMP: finished yesterday   Flowsheet Row Office Visit from 10/07/2020 in [facility]  PHQ-9 Total Score 2        PERTINENT  PMH / PSH: GERD, HLD, sciatica   OBJECTIVE:   BP (!) 112/58   Pulse 79   Ht 5\' 4"  (1.626 m)   Wt 222 lb 12.8 oz (101.1 kg)   SpO2 99%   BMI 38.24 kg/m    General: Alert, no acute distress Cardio: Normal S1 and S2, RRR, no r/m/g Pulm: CTAB, normal work of breathing Abdomen: Bowel sounds normal. Abdomen soft and non-tender.  No renal angle tenderness  Neuro: Cranial nerves grossly intact    Back Normal skin, Spine with normal alignment and no deformity. Point tenderness to lumbar vertebral process palpation.  Lumbar paraspinous muscles are  tender and without spasm.   Range of motion is full at neck and lumbar sacral regions. Positive straight leg test.   ASSESSMENT/PLAN:   Left-sided low back pain with sciatica Likely sciatica. Pt does have a hx of sciatica in her previous pregnancy. No red flags for back pain. Considered pyelonephritis and  renal colic however less likely based on history and physical exam today. UA: trace leuks. Have sent urine culture. Recommended Tylenol 1000mg  up to 4 times a day, ibuprofen 400mg  up to 3 times a day, heat pad/ice therapy, gentle exercise and movement. Follow up in 2-4 weeks if no improvement. Could consider lumbar imaging at this point if no improvement.    , MD PGY-3 [facility]

## 2020-10-07 NOTE — Patient Instructions (Signed)
Thank you for coming to see me today. It was a pleasure. Today we discussed your back pain. I think it musculoskeletal and related to sciatica. I recommend tylenol 1000mg  4 x a day, ibuprofen 400mg  x 3 a day, gentle stretching, heat therapy etc. Would also recommend weight loss   Please follow-up with PCP if still having symptoms. Could consider imaging of back.   If you have any questions or concerns, please do not hesitate to call the office at [phone].  Best wishes,   Dr 

## 2020-10-08 NOTE — Assessment & Plan Note (Addendum)
Likely sciatica. Pt does have a hx of sciatica in her previous pregnancy. No red flags for back pain. Considered pyelonephritis and renal colic however less likely based on history and physical exam today. UA: trace leuks. Have sent urine culture. Recommended Tylenol 1000mg  up to 4 times a day, ibuprofen 400mg  up to 3 times a day, heat pad/ice therapy, gentle exercise and movement. Follow up in 2-4 weeks if no improvement. Could consider lumbar imaging at this point if no improvement.

## 2020-10-11 LAB — URINE CULTURE

## 2020-12-10 ENCOUNTER — Encounter: Payer: Self-pay | Admitting: Family Medicine

## 2020-12-10 ENCOUNTER — Other Ambulatory Visit: Payer: Self-pay

## 2020-12-10 ENCOUNTER — Ambulatory Visit (INDEPENDENT_AMBULATORY_CARE_PROVIDER_SITE_OTHER): Payer: Self-pay | Admitting: Family Medicine

## 2020-12-10 ENCOUNTER — Ambulatory Visit
Admission: RE | Admit: 2020-12-10 | Discharge: 2020-12-10 | Disposition: A | Discharge status: Home / Self Care | Payer: Medicaid Other | Source: Ambulatory Visit | Attending: Family Medicine | Admitting: Family Medicine

## 2020-12-10 ENCOUNTER — Telehealth: Payer: Self-pay | Admitting: Family Medicine

## 2020-12-10 VITALS — BP 108/69 | HR 74 | Temp 99.8°F | Ht 64.0 in | Wt 226.6 lb

## 2020-12-10 DIAGNOSIS — K5909 Other constipation: Secondary | ICD-10-CM | POA: Insufficient documentation

## 2020-12-10 DIAGNOSIS — R1084 Generalized abdominal pain: Secondary | ICD-10-CM

## 2020-12-10 DIAGNOSIS — R1032 Left lower quadrant pain: Secondary | ICD-10-CM

## 2020-12-10 DIAGNOSIS — R109 Unspecified abdominal pain: Secondary | ICD-10-CM

## 2020-12-10 LAB — POCT URINALYSIS DIP (MANUAL ENTRY)
Bilirubin, UA: NEGATIVE
Blood, UA: NEGATIVE
Glucose, UA: NEGATIVE mg/dL
Nitrite, UA: NEGATIVE
Protein Ur, POC: NEGATIVE mg/dL
Spec Grav, UA: >=1.03 — AB (ref 1.010–1.025)
Urobilinogen, UA: 0.2 E.U./dL
pH, UA: 5 (ref 5.0–8.0)

## 2020-12-10 LAB — POCT UA - MICROSCOPIC ONLY

## 2020-12-10 LAB — POCT URINE PREGNANCY: Preg Test, Ur: NEGATIVE

## 2020-12-10 MED ORDER — METRONIDAZOLE 500 MG PO TABS: 500.0000 mg | ORAL_TABLET | Freq: Three times a day (TID) | ORAL | 0 refills | Status: DC

## 2020-12-10 NOTE — Patient Instructions (Signed)
I have put in for you to get some abdominal x-rays and I will send you a note about that.  Unless there is something seen on them that I do not expect, I will not call you.    I am also starting you on an antibiotic for 7 days.  Please take it with food 3 times a day.    I have sent in an urgent referral for you to be seen by a gastroenterologist which is a stomach doctor.  I think your pain may be related to the small bowel or the colon rather than to your kidney.  You should receive a call from their office in the next week.  If you have not, please let our office know.  Additionally, I would like you to schedule an appointment back here with your PCP in 3 or 4 weeks.  If you have worsening symptoms in the interim, either go to the emergency room or give [place] a call.  Regarding her lab work today, you are not pregnant and there is no sign of infection in your urine.

## 2020-12-10 NOTE — Telephone Encounter (Signed)
Results of xrays

## 2020-12-10 NOTE — Assessment & Plan Note (Signed)
I reviewed her notes and imaging back through 2016.  It seems this has been a bit of a recurrent issue for her.  She is very concerned about a kidney stone because she read on the Internet that seeing blood in your urine and having pain in your left back area was most likely kidney stone.  Urinalysis today does not indicate infection or any significant amount of blood in her urine.  Given her history of partial small bowel obstruction in 2016 which was accompanied by somewhat similar symptoms, I suspect that she is having some type of recurrent abdominal pain related to bowel.  I do not think it is related to the kidney and does not seem to be related to the spine or musculoskeletal system.    -Given her history of small bowel obstruction, we will get imaging today of acute abdominal series to rule out similar issues.   -Will refer her to gastroenterology for further evaluation.  She may be just having chronic constipation but given her history of obstruction and her history of several abdominal surgeries related to C-section, I think she is at risk for adhesions that could cause bowel issues.  - Long discussion with her today reveals that she seems to have intermittent constipation and diarrhea that is longstanding.  She does not describe blood in her stool.   -She would likely benefit from gastroenterology evaluation. Differential includes ,chronic constipation, diverticulosis, and less likely  inflammatory bowel disease although this is less likely since she is not had blood in the stool and no weight loss.  .  Given her reported fever last night, I we will treat with 7 days of metronidazole.  Precautions for red flags such as worsening symptoms given.   - She is also to set up an appointment for follow-up with our clinic in 3 to 4 weeks.

## 2020-12-10 NOTE — Progress Notes (Signed)
CHIEF COMPLAINT / HPI:  Patient deferred use of an interpreter, states she understands and can speak [place].  Left lower quadrant and left flank pain.  Has had this off and on for several months but in the last 3 or 4 days its become much worse.  Pain is now 6-7 out of 10 at times.  She is also had some diarrhea.  She thought she saw a little bit of blood in her urine.  Last night she had fever to 101.  Pain is always there, usually at the level of 2 or 3 out of 10 but in the last 2 to 3 days she has had much more episodes of 6 out of 10 pain.  Crampy.  Not relieved by bowel movement.   PERTINENT  PMH / PSH: I have reviewed the patient's medications, allergies, past medical and surgical history, smoking status and updated in the EMR as appropriate. Multiple abdominal CT scans: November 2020, January 2017, April, 2013 which were all normal.   Complete transvaginal and pelvic ultrasound November 2020 was negative for ovarian torsion, did show presence of IUD.   CT abdomen pelvis with contrast in December 2016 which showed proximal small bowel obstruction with transition zone in the left upper abdomen; ,.  CT renal stone study January 2017 showed resolution of dilated small bowel loops.  OBJECTIVE:  BP 108/69   Pulse 74   Temp 99.8 F (37.7 C) (Oral)   Ht 5\' 4"  (1.626 m)   Wt 226 lb 9.6 oz (102.8 kg)   LMP 11/22/2020   SpO2 99%   BMI 38.90 kg/m  GENERAL: Well-developed female no acute distress BACK: No CVA tenderness.  There is no thoracic or lumbar vertebral pain to palpation or percussion.  The area she points to for the beginning of her pain is mid lumbar area on the left side radiating around to the mid lower abdomen.  It does not cross over into the right side.  Palpation or percussion here does not reproduce or ease her pain.  Currently she says her pains of 1 or 2 out of 10.  Movement does seem to make it a little worse at times particularly if she rotates the trunk with lower  extremity fixed. ABDOMEN: Obese, soft, positive bowel sounds.  Nontender no rebound or guarding.  No masses are noted although this exam is somewhat limited by habitus. SKIN: Skin of the low back and the abdomen is without any sign of bruising, no unusual ecchymoses or erythema.  ASSESSMENT / PLAN:   Left lower quadrant abdominal pain I reviewed her notes and imaging back through 2016.  It seems this has been a bit of a recurrent issue for her.  She is very concerned about a kidney stone because she read on the Internet that seeing blood in your urine and having pain in your left back area was most likely kidney stone.  Urinalysis today does not indicate infection or any significant amount of blood in her urine.  Given her history of partial small bowel obstruction in 2016 which was accompanied by somewhat similar symptoms, I suspect that she is having some type of recurrent abdominal pain related to bowel.  I do not think it is related to the kidney and does not seem to be related to the spine or musculoskeletal system.    -Given her history of small bowel obstruction, we will get imaging today of acute abdominal series to rule out similar issues.   -Will  refer her to gastroenterology for further evaluation.  She may be just having chronic constipation but given her history of obstruction and her history of several abdominal surgeries related to C-section, I think she is at risk for adhesions that could cause bowel issues.  - Long discussion with her today reveals that she seems to have intermittent constipation and diarrhea that is longstanding.  She does not describe blood in her stool.   -She would likely benefit from gastroenterology evaluation. Differential includes ,chronic constipation, diverticulosis, and less likely  inflammatory bowel disease although this is less likely since she is not had blood in the stool and no weight loss.  .  Given her reported fever last night, I we will treat with  7 days of metronidazole.  Precautions for red flags such as worsening symptoms given.   - She is also to set up an appointment for follow-up with our clinic in 3 to 4 weeks.   Denny Levy MD

## 2020-12-11 MED ORDER — POLYETHYLENE GLYCOL 3350 17 GM/SCOOP PO POWD: ORAL | 1 refills | Status: DC

## 2020-12-11 NOTE — Telephone Encounter (Signed)
Spoke with patient.  X-ray is normal except for large stool burden.  She has some questions about how that can be possible as she says she has normal bowel movement every day.  She is already scheduled with gastroenterology which is great.  I will get her an appointment scheduled with [place] in a week or 2 after that and send that to her have them call her.  I will also start her on low-dose fiber supplement daily.  I discussed this with her.  She will continue the antibiotics although after looking at the x-ray is much more convinced that chronic constipation is playing a large part of this chronic abdominal pain.

## 2020-12-31 ENCOUNTER — Encounter: Payer: Self-pay | Admitting: Internal Medicine

## 2020-12-31 ENCOUNTER — Other Ambulatory Visit (INDEPENDENT_AMBULATORY_CARE_PROVIDER_SITE_OTHER): Payer: Self-pay

## 2020-12-31 ENCOUNTER — Ambulatory Visit (INDEPENDENT_AMBULATORY_CARE_PROVIDER_SITE_OTHER): Payer: Self-pay | Admitting: Internal Medicine

## 2020-12-31 VITALS — BP 130/94 | HR 75 | Ht 64.0 in | Wt 228.0 lb

## 2020-12-31 DIAGNOSIS — K5900 Constipation, unspecified: Secondary | ICD-10-CM

## 2020-12-31 DIAGNOSIS — R1032 Left lower quadrant pain: Secondary | ICD-10-CM

## 2020-12-31 DIAGNOSIS — K219 Gastro-esophageal reflux disease without esophagitis: Secondary | ICD-10-CM

## 2020-12-31 LAB — COMPREHENSIVE METABOLIC PANEL
ALT: 14 U/L (ref 0–35)
AST: 13 U/L (ref 0–37)
Albumin: 4.2 g/dL (ref 3.5–5.2)
Alkaline Phosphatase: 61 U/L (ref 39–117)
BUN: 9 mg/dL (ref 6–23)
CO2: 30 mEq/L (ref 19–32)
Calcium: 9.2 mg/dL (ref 8.4–10.5)
Chloride: 103 mEq/L (ref 96–112)
Creatinine, Ser: 0.61 mg/dL (ref 0.40–1.20)
GFR: 115.75 mL/min (ref >60.00)
Glucose, Bld: 88 mg/dL (ref 70–99)
Potassium: 3.8 mEq/L (ref 3.5–5.1)
Sodium: 139 mEq/L (ref 135–145)
Total Bilirubin: 0.4 mg/dL (ref 0.2–1.2)
Total Protein: 7.3 g/dL (ref 6.0–8.3)

## 2020-12-31 LAB — CBC WITH DIFFERENTIAL/PLATELET
Basophils Absolute: 0.1 10*3/uL (ref 0.0–0.1)
Basophils Relative: 0.8 % (ref 0.0–3.0)
Eosinophils Absolute: 0.1 10*3/uL (ref 0.0–0.7)
Eosinophils Relative: 1.4 % (ref 0.0–5.0)
HCT: 37.8 % (ref 36.0–46.0)
Hemoglobin: 12.4 g/dL (ref 12.0–15.0)
Lymphocytes Relative: 27.7 % (ref 12.0–46.0)
Lymphs Abs: 1.8 10*3/uL (ref 0.7–4.0)
MCHC: 32.8 g/dL (ref 30.0–36.0)
MCV: 77.2 fl — ABNORMAL LOW (ref 78.0–100.0)
Monocytes Absolute: 0.4 10*3/uL (ref 0.1–1.0)
Monocytes Relative: 5.7 % (ref 3.0–12.0)
Neutro Abs: 4.3 10*3/uL (ref 1.4–7.7)
Neutrophils Relative %: 64.4 % (ref 43.0–77.0)
Platelets: 299 10*3/uL (ref 150.0–400.0)
RBC: 4.89 Mil/uL (ref 3.87–5.11)
RDW: 14.9 % (ref 11.5–15.5)
WBC: 6.6 10*3/uL (ref 4.0–10.5)

## 2020-12-31 LAB — LIPASE: Lipase: 20 U/L (ref 11.0–59.0)

## 2020-12-31 LAB — HEMOGLOBIN A1C: Hgb A1c MFr Bld: 5.5 % (ref 4.6–6.5)

## 2020-12-31 LAB — TSH: TSH: 1.52 u[IU]/mL (ref 0.35–5.50)

## 2020-12-31 NOTE — Patient Instructions (Addendum)
Your provider has requested that you go to the basement level for lab work before leaving today. Press "B" on the elevator. The lab is located at the first door on the left as you exit the elevator.  Please start drinking 8 cups of water daily and walking 30 minutes daily.   Start over the counter Miralax mixing 17 grams of 8 oz of water.  You have been scheduled for a CT scan of the abdomen and pelvis at [facility], 1st floor Radiology. You are scheduled on 01/05/21  at 7:30am. You should arrive 15 minutes prior to your appointment time for registration.  Please pick up 2 bottles of contrast from [place] at least 3 days prior to your scan. The solution may taste better if refrigerated, but do NOT add ice or any other liquid to this solution. Shake well before drinking.   Please follow the written instructions below on the day of your exam:   1) Do not eat anything after 3:30am (4 hours prior to your test)   2) Drink 1 bottle of contrast @ 5:30am (2 hours prior to your exam)  Remember to shake well before drinking and do NOT pour over ice.     Drink 1 bottle of contrast @ 6:30am (1 hour prior to your exam)   You may take any medications as prescribed with a small amount of water, if necessary. If you take any of the following medications: METFORMIN, GLUCOPHAGE, GLUCOVANCE, AVANDAMET, RIOMET, FORTAMET, [place] MET, JANUMET, GLUMETZA or METAGLIP, you MAY be asked to HOLD this medication 48 hours AFTER the exam.   The purpose of you drinking the oral contrast is to aid in the visualization of your intestinal tract. The contrast solution may cause some diarrhea. Depending on your individual set of symptoms, you may also receive an intravenous injection of x-ray contrast/dye. Plan on being at [facility] for 45 minutes or longer, depending on the type of exam you are having performed.   If you have any questions regarding your exam or if you need to reschedule, you may call [facility] at [phone] between the hours of 8:00 am and 5:00 pm, Monday-Friday.   Due to recent changes in healthcare laws, you may see the results of your imaging and laboratory studies on MyChart before your provider has had a chance to review them.  We understand that in some cases there may be results that are confusing or concerning to you. Not all laboratory results come back in the same time frame and the provider may be waiting for multiple results in order to interpret others.  Please give [place] 48 hours in order for your provider to thoroughly review all the results before contacting the office for clarification of your results.   The [facility] providers would like to encourage you to use [facility] to communicate with providers for non-urgent requests or questions.  Due to long hold times on the telephone, sending your provider a message by [facility] may be a faster and more efficient way to get a response.  Please allow 48 business hours for a response.  Please remember that this is for non-urgent requests.

## 2020-12-31 NOTE — Progress Notes (Signed)
Chief Complaint: Abdominal pain  HPI : 35 year old female with history of headache presents with abdominal pain  The pain is located in her lower abdomen and started a few months ago. The pain radiates around her abdomen to her back. For the last month, this pain has been worse. She thinks that she has normal bowel habits. Has on average one stool per day. Endorses N&V that happens on occasion. Denies black stools, dysphagia. Endorses some chest burning and regurgitation on occasion.  Endorses some red bleeding on occasion. She takes collagen with fiber every day. Denies prior EGD or colonoscopy.Denies fam hx of GI cancers. She does endorse some NSAID use.    Past Medical History:  Diagnosis Date   Diabetes mellitus without complication (HCC)    Gestational diabetes    Headache(784.0)    otc med prn   Left facial pain 09/17/2015   Miscarriage 08/26/2010   SAB (spontaneous abortion) 2012   x 2 in 2012 - no surgery required     Past Surgical History:  Procedure Laterality Date   CESAREAN SECTION N/A 05/09/2012   Procedure: Primary CESAREAN SECTION of baby boy  at 0435 APGAR 9/9;  Surgeon: Lavina Hamman, MD;  Location: [facility];  Service: Obstetrics;  Laterality: N/A;   CESAREAN SECTION N/A 05/18/2013   Procedure: REPEAT CESAREAN SECTION;  Surgeon: Oliver Pila, MD;  Location: [facility] ORS;  Service: Obstetrics;  Laterality: N/A;   CESAREAN SECTION N/A 03/16/2016   Procedure: REPEAT CESAREAN SECTION;  Surgeon: Huel Cote, MD;  Location: [facility] BIRTHING SU[facility];  Service: Obstetrics;  Laterality: N/A;  MD requests RNFA   TONSILLECTOMY     Family History  Problem Relation Age of Onset   Alcohol abuse Father    Diabetes Father    Hypertension Father    Miscarriages / [place] Sister    Clotting disorder Sister    Social History   Tobacco Use   Smoking status: Never   Smokeless tobacco: Former    Quit date: 08/23/2012   Tobacco comments:    Hookah 2-3 times per week  Vaping Use    Vaping Use: Never used  Substance Use Topics   Alcohol use: No   Drug use: No   Current Outpatient Medications  Medication Sig Dispense Refill   acetaminophen (TYLENOL) 325 MG tablet Take 2 tablets (650 mg total) by mouth every 6 (six) hours as needed. Do not take more than 4000mg  of tylenol per day 30 tablet 0   ibuprofen (ADVIL,MOTRIN) 400 MG tablet Take 1 tablet (400 mg total) by mouth every 6 (six) hours as needed. 30 tablet 0   metroNIDAZOLE (FLAGYL) 500 MG tablet Take 1 tablet (500 mg total) by mouth 3 (three) times daily. 21 tablet 0   ondansetron (ZOFRAN) 4 MG tablet Take 1-2 tablets (4-8 mg total) by mouth every 6 (six) hours. As needed nausea 12 tablet 0   paragard intrauterine copper IUD IUD ParaGard T 380A 380 square mm intrauterine device  Take 1 device by intrauterine route.     polyethylene glycol powder (GLYCOLAX/MIRALAX) 17 GM/SCOOP powder Take one half scoop or one half package with water daily 510 g 1   triamcinolone ointment (KENALOG) 0.1 % Apply 1 application topically 2 (two) times daily. 80 g 0   No current facility-administered medications for this visit.   No Known Allergies   Review of Systems: All systems reviewed and negative except where noted in HPI.   Physical Exam: BP (!) 130/94  Pulse 75   Ht 5\' 4"  (1.626 m)   Wt 228 lb (103.4 kg)   BMI 39.14 kg/m  Constitutional: Pleasant,well-developed, female in no acute distress. HEENT: Normocephalic and atraumatic. Conjunctivae are normal. No scleral icterus. Cardiovascular: Normal rate, regular rhythm.  Pulmonary/chest: Effort normal and breath sounds normal. No wheezing, rales or rhonchi. Abdominal: Soft, nondistended, tender in the lower abdomen. Bowel sounds active throughout. There are no masses palpable. No hepatomegaly. Extremities: No edema Neurological: Alert and oriented to person place and time. Skin: Skin is warm and dry. No rashes noted. Psychiatric: Normal mood and affect. Behavior is  normal.  Labs 12/2018: CBC, BMP, and TSH unremarkable  CT A/P 11/2018: IMPRESSION: No calculi or hydronephrosis.  DG Abd Acute W/Chest  Result Date: 12/10/2020 CLINICAL DATA:  Nausea and vomiting x1 month with left lower quadrant abdominal pain. EXAM: DG ABDOMEN ACUTE WITH 1 VIEW CHEST COMPARISON:  January 16, 2015 FINDINGS: There is no evidence of dilated bowel loops or free intraperitoneal air. A large stool burden is noted. No radiopaque calculi or other significant radiographic abnormality is seen. An IUD is in place. Heart size and mediastinal contours are within normal limits. Both lungs are clear. IMPRESSION: Negative abdominal radiographs. No acute cardiopulmonary disease. Electronically Signed   By: January 18, 2015 M.D.   On: 12/10/2020 22:28    ASSESSMENT AND PLAN:  Left lower quadrant pain Constipation GERD Patient presents with lower ab pain that started a few months ago. Has N&V that happens on occasion. She recently had a KUB that showed large stool burden. At this time I do suspect that her symptoms may be due to constipation, but will also perform CT A/P to rule out alternative intra-abdominal pathologies. Will institute some conservative therapies for constipation - CBC, CMP, lipase, HbA1C, TSH. All of her labs came back as normal. - Drinking 8 cups of water per day - Aim for walking at least 30 minutes per day - Start Miralax QD - CT A/P w/contrast to rule out contribution from diverticulitis and can evaluate stool burden at that time - Consider colonoscopy in the future for evaluation of scant rectal bleeding - Consider PPI trial in the future since patient describes some reflux and N&V - RTC 2 months  12/12/2020, MD

## 2021-01-01 ENCOUNTER — Other Ambulatory Visit: Payer: Self-pay

## 2021-01-01 ENCOUNTER — Ambulatory Visit (INDEPENDENT_AMBULATORY_CARE_PROVIDER_SITE_OTHER): Payer: Self-pay | Admitting: Family Medicine

## 2021-01-01 VITALS — BP 110/66 | HR 68 | Wt 227.0 lb

## 2021-01-01 DIAGNOSIS — K5909 Other constipation: Secondary | ICD-10-CM

## 2021-01-01 DIAGNOSIS — K649 Unspecified hemorrhoids: Secondary | ICD-10-CM

## 2021-01-01 NOTE — Patient Instructions (Signed)
As we discussed today, I want you to continue the fiber supplement.  The fiber supplement prescription I gave you in the 1 the stomach doctor gave you are essentially the same so just use one of them until you run out, then you can use the rest of the other 1.  Stay at your current dose until I see you back.  If you have any new or worsening symptoms, please call the office.  I will also send you a note about your CT scan after I look at it on Monday.  It was great seeing you!

## 2021-01-01 NOTE — Progress Notes (Signed)
    CHIEF COMPLAINT / HPI: Follow-up abdominal and flank pain.  Has been seen by the gastroenterologist.  Started on the fiber supplement after I sent it in for her and is tapered up to 1 scoop per day.  She is continuing to have daily bowel movements.  She has had 2 episodes of diarrhea.  She had additionally started some prune juice.  Symptoms are little better, with pain less frequent.  No new symptoms.  Has history of hemorrhoids.  On occasion, they can become quite inflamed and she uses over-the-counter medication with relief.  They are not problematic right now.  She wonders if that is where the blood in her stool came from that we discussed at last office visit.   PERTINENT  PMH / PSH: I have reviewed the patient's medications, allergies, past medical and surgical history, smoking status and updated in the EMR as appropriate. Small bowel obstruction  OBJECTIVE:  BP 110/66   Pulse 68   Wt 227 lb (103 kg)   SpO2 100%   BMI 38.96 kg/m  GENERAL: Well-developed female no acute distress ABDOMEN: Soft, nondistended.  I MAGING: Reviewed acute abdominal series showing large stool burden.  Negative for free air. CT abdomen pelvis is scheduled for next week.  ASSESSMENT / PLAN:   Left lower quadrant abdominal pain Reviewed gastroenterology note and they agreed that this is most likely abdominal flank and back pain related to chronic constipation.  They had given her an additional prescription for MiraLAX which is the same medication I have given her only in generic form.  We discussed that she is to take once daily of one of them.  I would DC prune juice. I clarified with her that the prescription gastroenterology gave her and the one I gave her for fiber or equivalent.   She has follow-up with GI in 2 months, I will set her follow-up with me in 1 month.   She has CT appt next week. I will review her CT scan when it is read.  Offered some hemorrhoid cream but she said the over-the-counter  works just fine.     Denny Levy MD

## 2021-01-01 NOTE — Assessment & Plan Note (Addendum)
Reviewed gastroenterology note and they agreed that this is most likely abdominal flank and back pain related to chronic constipation.  They had given her an additional prescription for MiraLAX which is the same medication I have given her only in generic form.  We discussed that she is to take once daily of one of them.  I would DC prune juice. I clarified with her that the prescription gastroenterology gave her and the one I gave her for fiber or equivalent.   She has follow-up with GI in 2 months, I will set her follow-up with me in 1 month.   She has CT appt next week. I will review her CT scan when it is read.  Offered some hemorrhoid cream but she said the over-the-counter works just fine.

## 2021-01-05 ENCOUNTER — Ambulatory Visit (HOSPITAL_COMMUNITY)
Admission: RE | Admit: 2021-01-05 | Discharge: 2021-01-05 | Disposition: A | Discharge status: Home / Self Care | Payer: Medicaid Other | Source: Ambulatory Visit | Attending: Internal Medicine | Admitting: Internal Medicine

## 2021-01-05 DIAGNOSIS — K5900 Constipation, unspecified: Secondary | ICD-10-CM | POA: Diagnosis present

## 2021-01-05 DIAGNOSIS — R1032 Left lower quadrant pain: Secondary | ICD-10-CM | POA: Insufficient documentation

## 2021-01-05 MED ORDER — IOHEXOL 350 MG/ML SOLN
80.0000 mL | Freq: Once | INTRAVENOUS | Status: AC | PRN
  Administered 2021-01-05: 80 mL via INTRAVENOUS

## 2021-01-07 NOTE — Progress Notes (Signed)
Hi Ammie, please let the patient know that she has a finding in her liver that was not well delineated on her CT scan. Thus she will need a MRI abdomen w/contrast (please order this) to better visualize the liver. Her CT shows that she has significant amounts of stool. She should continue her Miralax therapy and can increase this to up to TID in order to achieve at least one good large BM per day. Thanks.

## 2021-01-08 ENCOUNTER — Other Ambulatory Visit: Payer: Self-pay

## 2021-01-08 DIAGNOSIS — R1032 Left lower quadrant pain: Secondary | ICD-10-CM

## 2021-01-20 ENCOUNTER — Ambulatory Visit (HOSPITAL_COMMUNITY)
Admission: RE | Admit: 2021-01-20 | Discharge: 2021-01-20 | Disposition: A | Discharge status: Home / Self Care | Payer: Medicaid Other | Source: Ambulatory Visit | Attending: Internal Medicine | Admitting: Internal Medicine

## 2021-01-20 ENCOUNTER — Other Ambulatory Visit: Payer: Self-pay

## 2021-01-20 DIAGNOSIS — R1032 Left lower quadrant pain: Secondary | ICD-10-CM | POA: Insufficient documentation

## 2021-01-20 MED ORDER — GADOBUTROL 1 MMOL/ML IV SOLN
10.0000 mL | Freq: Once | INTRAVENOUS | Status: AC | PRN
  Administered 2021-01-20: 08:00:00 10 mL via INTRAVENOUS

## 2021-01-21 ENCOUNTER — Other Ambulatory Visit: Payer: Self-pay

## 2021-01-21 DIAGNOSIS — R935 Abnormal findings on diagnostic imaging of other abdominal regions, including retroperitoneum: Secondary | ICD-10-CM

## 2021-01-21 DIAGNOSIS — K769 Liver disease, unspecified: Secondary | ICD-10-CM

## 2021-02-09 ENCOUNTER — Other Ambulatory Visit: Payer: Self-pay

## 2021-02-09 ENCOUNTER — Ambulatory Visit (INDEPENDENT_AMBULATORY_CARE_PROVIDER_SITE_OTHER): Payer: Self-pay | Admitting: Family Medicine

## 2021-02-09 ENCOUNTER — Encounter: Payer: Self-pay | Admitting: Family Medicine

## 2021-02-09 VITALS — BP 106/72 | HR 64 | Ht 64.0 in | Wt 227.0 lb

## 2021-02-09 DIAGNOSIS — K5909 Other constipation: Secondary | ICD-10-CM

## 2021-02-09 DIAGNOSIS — R1084 Generalized abdominal pain: Secondary | ICD-10-CM

## 2021-02-09 DIAGNOSIS — R300 Dysuria: Secondary | ICD-10-CM

## 2021-02-09 LAB — POCT URINALYSIS DIP (CLINITEK)
Bilirubin, UA: NEGATIVE
Blood, UA: NEGATIVE
Glucose, UA: NEGATIVE mg/dL
Ketones, POC UA: NEGATIVE mg/dL
Nitrite, UA: NEGATIVE
POC PROTEIN,UA: NEGATIVE
Spec Grav, UA: 1.015 (ref 1.010–1.025)
Urobilinogen, UA: 0.2 E.U./dL
pH, UA: 6 (ref 5.0–8.0)

## 2021-02-09 LAB — POCT UA - MICROSCOPIC ONLY
Epithelial cells, urine per micros: >20
WBC, Ur, HPF, POC: >20 (ref 0–5)

## 2021-02-09 MED ORDER — CEPHALEXIN 500 MG PO CAPS: 500.0000 mg | ORAL_CAPSULE | Freq: Two times a day (BID) | ORAL | 0 refills | Status: AC

## 2021-02-09 NOTE — Progress Notes (Signed)
° ° °  SUBJECTIVE:   CHIEF COMPLAINT / HPI: f/u abdominal pain  Seen by GI felt to be constipation related started on Miralax. CT scan last month revealed a liver lesion which was followed up by a MRI, findings favored to represent benign focal nodular hyperplasia with recommendation to repeat MRI in 3-6 months.  Today, patient states that abdominal pain is persistent.  Pain does improve after defecation but she reports lower abdominal pain which is always constant.  Taking Evogen Light and Tight supplement which she feels is helping with bloating.  She stopped taking the MiraLAX because she did not feel it was helping.  Stools every 1-2 days which she states are hard.  She has had some urinary frequency and dysuria for the past few months.  No new symptoms including fever, chills, nausea, vomiting.  PERTINENT  PMH / PSH: Copper IUD contraception, hemorrhoids  OBJECTIVE:   BP 106/72    Pulse 64    Ht 5\' 4"  (1.626 m)    Wt 227 lb (103 kg)    SpO2 100%    BMI 38.96 kg/m   General: alert, NAD CV: RRR, no murmurs Pulm: CTAB, no wheezes or rales Abdomen: Soft, mild diffuse tenderness primarily lower abdomen, positive bowel sounds, no CVA tenderness  ASSESSMENT/PLAN:   Chronic constipation Abdominal pain still present, likely secondary to underlying constipation. UTI possibly playing a role. 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  Also with findings suggestive of constipation. UA with 2+ leukocytes and many bacteria, nitrite negative. Exam benign.  - advised to restart Miralax up to twice a day - can continue OTC supplement - cephalexin 500 mg BID x 7 days for UTI - add on urine culture - consider pelvic workup if pain not improving     Zola Button, MD [place]

## 2021-02-09 NOTE — Patient Instructions (Addendum)
It was nice seeing you today!  Take the Miralax powder 1-2 times per day to keep your stools soft.  Make sure to schedule an appointment for a pap smear.  Stay well, Marissa Deeds, MD [facility] [phone]  --  Make sure to check out at the front desk before you leave today.  Please arrive at least 15 minutes prior to your scheduled appointments.  If you had blood work today, I will send you a MyChart message or a letter if results are normal. Otherwise, I will give you a call.  If you had a referral placed, they will call you to set up an appointment. Please give [place] a call if you don't hear back in the next 2 weeks.  If you need additional refills before your next appointment, please call your pharmacy first.

## 2021-02-09 NOTE — Assessment & Plan Note (Addendum)
Abdominal pain still present, likely secondary to underlying constipation. UTI possibly playing a role. 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  Also with findings suggestive of constipation. UA with 2+ leukocytes and many bacteria, nitrite negative. Exam benign.  - advised to restart Miralax up to twice a day - can continue OTC supplement - cephalexin 500 mg BID x 7 days for UTI - add on urine culture - consider pelvic workup if pain not improving

## 2021-02-11 LAB — URINE CULTURE

## 2021-02-26 NOTE — Progress Notes (Deleted)
° ° °  SUBJECTIVE:   CHIEF COMPLAINT / HPI:   Pap smear: Last pap done 02/2015: NILM, HPV-. Due for pap today.  Constipation: Pt reports ***  Liver lesion: patient has a hyperenhancing lesion within the R inf hepatic lobe, favoring a benign focal nodular hyperplasia per radiology. A repeat MRI is recommended in 3-6 months from December MRI, currently scheduled for end of March   Vaginal Discharge: Patient is a 36 y.o. female presenting with vaginal discharge for *** days.  She states the discharge is of *** consistency.  She endorses *** vaginal odor.  She is interested in screening for sexually transmitted infections today.  PERTINENT  PMH / PSH: ***None relevant  OBJECTIVE:   There were no vitals taken for this visit.   General: NAD, pleasant, able to participate in exam Respiratory: Normal effort, no obvious respiratory distress Pelvic: VULVA: normal appearing vulva with no masses, tenderness or lesions, VAGINA: Normal appearing vagina with normal color, no lesions, with {GYN VAGINAL DISCHARGE:21986} discharge present, ***CERVIX: No lesions, {GYN VAGINAL DISCHARGE:21986} discharge present,  ASSESSMENT/PLAN:   No problem-specific Assessment & Plan notes found for this encounter.    Assessment:  36 y.o. female with vaginal discharge for***days, as well as***.  Physical exam significant for*** discharge.  Wet prep performed today shows *** consistent with ***.  Patient is interested in STI screening.   Plan: -Wet prep as above.  Will treat with***. -GC/chlamydia pending -Will check HIV and RPR  Shirlean Mylar, MD [facility]    This note was prepared using Dragon voice recognition software and may include unintentional dictation errors due to the inherent limitations of voice recognition software.

## 2021-02-27 ENCOUNTER — Other Ambulatory Visit (HOSPITAL_COMMUNITY)
Admission: RE | Admit: 2021-02-27 | Discharge: 2021-02-27 | Disposition: A | Discharge status: Home / Self Care | Payer: Medicaid Other | Source: Ambulatory Visit | Attending: Family Medicine | Admitting: Family Medicine

## 2021-02-27 ENCOUNTER — Ambulatory Visit (INDEPENDENT_AMBULATORY_CARE_PROVIDER_SITE_OTHER): Payer: Medicaid Other | Admitting: Family Medicine

## 2021-02-27 ENCOUNTER — Other Ambulatory Visit: Payer: Self-pay

## 2021-02-27 VITALS — BP 112/64 | HR 67 | Wt 224.2 lb

## 2021-02-27 DIAGNOSIS — Z113 Encounter for screening for infections with a predominantly sexual mode of transmission: Secondary | ICD-10-CM | POA: Insufficient documentation

## 2021-02-27 DIAGNOSIS — Z124 Encounter for screening for malignant neoplasm of cervix: Secondary | ICD-10-CM | POA: Diagnosis present

## 2021-02-27 LAB — POCT WET PREP (WET MOUNT)
Clue Cells Wet Prep Whiff POC: NEGATIVE
Trichomonas Wet Prep HPF POC: ABSENT

## 2021-02-27 NOTE — Assessment & Plan Note (Addendum)
36 y.o. female who would like STI screening. She is without concerns of vaginal discharge or odor. No concern for pregnancy as she has ParaGard IUD in place- strings appropriately visualized on examination today. Physical exam significant for scant clear discharge.  Plan: -Wet prep negative today -GC/chlamydia pending -Will check HIV, Hep B and RPR -Encouraged barrier protection to help protect against STI

## 2021-02-27 NOTE — Patient Instructions (Signed)
It was wonderful to see you today.  Please bring ALL of your medications with you to every visit.   Today we talked about:  -We did your Pap smear today. If normal, your next will be in 5 years. I will contact you with the results via MyChart. --We are checking for sexually transmitted infections including chlamydia, gonorrhea, trichomonas, HIV, syphilis and hepatitis B. I will let you know of the results via MyChart or telephone call. We are also checking for bacterial vaginosis and yeast, which are not sexually transmitted infections.  -You should abstain from sexual activity until we have the results. If your test is positive for a sexually transmitted infection, it is important that both you and your partner are both treated.  -It is always important to use barrier protection, such as condoms, to help prevent sexually transmitted infections.    Thank you for choosing [facility].   Please call [phone] with any questions about today's appointment.  Please be sure to schedule follow up at the front  desk before you leave today.   Sabino Dick, DO PGY-2 Family Medicine

## 2021-02-27 NOTE — Progress Notes (Signed)
° ° °  SUBJECTIVE:   CHIEF COMPLAINT / HPI:   Pap smear Marissa Carpenter is a 36 y.o. female who presents to the [facility] clinic today for routine Pap smear.  Last pap done 02/2015: NILM, HPV-.  Amenable to STI testing as well. Has ParaGard IUD in for contraception.  PERTINENT  PMH / PSH: None relevant  OBJECTIVE:   BP 112/64    Pulse 67    Wt 224 lb 3.2 oz (101.7 kg)    SpO2 100%    BMI 38.48 kg/m    General: NAD, pleasant, able to participate in exam Respiratory: Normal effort, no obvious respiratory distress Pelvic: VULVA: normal appearing vulva with no masses, tenderness or lesions, VAGINA: Normal appearing vagina with normal color, no lesions, with scant and clear discharge present CERVIX: No lesions, scant and clear discharge present, IUD strings visualized  GU exam chaperoned by Delray Alt, CMA  ASSESSMENT/PLAN:   Cervical cancer screening Pap smear and HPV co-testing performed today without complication. -We will reach out to patient with results via MyChart -If normal, next Pap smear with HPV cotesting in 5 years  Screening examination for sexually transmitted disease 36 y.o. female who would like STI screening. She is without concerns of vaginal discharge or odor. No concern for pregnancy as she has ParaGard IUD in place- strings appropriately visualized on examination today. Physical exam significant for scant clear discharge.  Plan: -Wet prep negative today -GC/chlamydia pending -Will check HIV, Hep B and RPR -Encouraged barrier protection to help protect against STI   Liver lesion: patient has a hyperenhancing lesion within the R inf hepatic lobe, favoring a benign focal nodular hyperplasia per radiology. A repeat MRI is recommended in 3-6 months from December MRI, currently scheduled for end of March   [place]

## 2021-02-27 NOTE — Assessment & Plan Note (Signed)
Pap smear and HPV co-testing performed today without complication. -We will reach out to patient with results via MyChart -If normal, next Pap smear with HPV cotesting in 5 years

## 2021-02-28 LAB — RPR: RPR Ser Ql: NONREACTIVE

## 2021-02-28 LAB — HIV ANTIBODY (ROUTINE TESTING W REFLEX): HIV Screen 4th Generation wRfx: NONREACTIVE

## 2021-02-28 LAB — HEPATITIS B SURFACE ANTIGEN: Hepatitis B Surface Ag: NEGATIVE

## 2021-03-03 ENCOUNTER — Ambulatory Visit (INDEPENDENT_AMBULATORY_CARE_PROVIDER_SITE_OTHER): Payer: Self-pay | Admitting: Internal Medicine

## 2021-03-03 ENCOUNTER — Encounter: Payer: Self-pay | Admitting: Internal Medicine

## 2021-03-03 VITALS — BP 114/80 | HR 91 | Ht 64.0 in | Wt 225.1 lb

## 2021-03-03 DIAGNOSIS — K5909 Other constipation: Secondary | ICD-10-CM

## 2021-03-03 DIAGNOSIS — K769 Liver disease, unspecified: Secondary | ICD-10-CM

## 2021-03-03 LAB — CYTOLOGY - PAP
Chlamydia: NEGATIVE
Comment: NEGATIVE
Comment: NEGATIVE
Comment: NORMAL
Diagnosis: NEGATIVE
High risk HPV: NEGATIVE
Neisseria Gonorrhea: NEGATIVE

## 2021-03-03 NOTE — Progress Notes (Signed)
Chief Complaint: Constipation  HPI : 36 year old female with history of headache presents for follow up for constipation  Interval History: She started taking Miralax and a stool softener that is working. She has one BM once every 1 to 3 days. Still having some ab pain in the lower abdomen but overall this is better. Does not feel like the pain is coming on as frequently. Denies N&V. Her bleeding in stool has gone away. She is happy with her current bowel habits.  She is drinking enough water per day and is trying to be more active, playing soccer with her kids.  She is eating plenty of fiber in her diet.  Current Outpatient Medications  Medication Sig Dispense Refill   acetaminophen (TYLENOL) 325 MG tablet Take 2 tablets (650 mg total) by mouth every 6 (six) hours as needed. Do not take more than 4000mg  of tylenol per day 30 tablet 0   paragard intrauterine copper IUD IUD ParaGard T 380A 380 square mm intrauterine device  Take 1 device by intrauterine route.     polyethylene glycol powder (GLYCOLAX/MIRALAX) 17 GM/SCOOP powder Take one half scoop or one half package with water daily (Patient taking differently: Take one half scoop or one half package with water daily PRN) 510 g 1   triamcinolone ointment (KENALOG) 0.1 % Apply 1 application topically 2 (two) times daily. 80 g 0   No current facility-administered medications for this visit.   Review of Systems: All systems reviewed and negative except where noted in HPI.   Physical Exam: BP 114/80    Pulse 91    Ht 5\' 4"  (1.626 m)    Wt 225 lb 2 oz (102.1 kg)    SpO2 98%    BMI 38.64 kg/m  Constitutional: Pleasant,well-developed, female in no acute distress. HEENT: Normocephalic and atraumatic. Conjunctivae are normal. No scleral icterus. Cardiovascular: Normal rate, regular rhythm.  Pulmonary/chest: Effort normal and breath sounds normal. No wheezing, rales or rhonchi. Abdominal: Soft, nondistended, mildly tender in the lower abdomen.  Bowel sounds active throughout. There are no masses palpable. No hepatomegaly. Extremities: No edema Neurological: Alert and oriented to person place and time. Skin: Skin is warm and dry. No rashes noted. Psychiatric: Normal mood and affect. Behavior is normal.  Labs 12/2018: CBC, BMP, and TSH unremarkable  CT A/P 11/2018: IMPRESSION: No calculi or hydronephrosis.  CT A/P w/contrast 01/05/21: IMPRESSION: 1. Indistinct hypodense 1.5 cm focus in the right liver lobe, not seen on prior contrast-enhanced 2016 CT, indeterminate. Recommend further characterization with MRI abdomen without and with IV contrast to exclude a liver lesion. 2. Short segment small bowel intussusception just to the right of midline in the central abdomen, without small bowel dilatation or associated discrete mass. This finding is generally transient and of no clinical significance. 3. Small bowel stool sign in the distal and terminal ileum. Moderate diffuse colonic stool volume. Findings suggest constipation.  MRI Abd 01/20/21: IMPRESSION: The hyperenhancing lesion within the inferior right hepatic lobe is favored to represent a area of benign focal nodular hyperplasia. Recommend 3 to six-month follow-up examination with repeat MRI using Eovist contrast material.  ASSESSMENT AND PLAN:  Constipation Ab discomfort GERD Liver lesion Patient presented for follow-up for constipation issues.  Her recent CT does suggest that she has significant stool burden that likely accounts for her issues with abdominal discomfort.  She has overall been doing better on constipation therapies and does not feel like she wants to advance her regimen any  further at this point. - Continue MiraLAX therapy.  Patient is not interested in any alternative regimens since she feels like her constipation is well controlled at this time. - Offered pelvic floor physical therapy patient is not interested at this time. - Follow-up MRI  abdomen to assess suspected benign focal nodular hyperplasia lesion - RTC as needed  Eulah Pont, MD

## 2021-03-03 NOTE — Patient Instructions (Signed)
Glad you are feeling better!  Please continue your Miralax

## 2021-04-21 ENCOUNTER — Ambulatory Visit (HOSPITAL_COMMUNITY)
Admission: RE | Admit: 2021-04-21 | Discharge: 2021-04-21 | Disposition: A | Discharge status: Home / Self Care | Payer: Medicaid Other | Source: Ambulatory Visit | Attending: Internal Medicine | Admitting: Internal Medicine

## 2021-04-21 ENCOUNTER — Other Ambulatory Visit: Payer: Self-pay

## 2021-04-21 DIAGNOSIS — R935 Abnormal findings on diagnostic imaging of other abdominal regions, including retroperitoneum: Secondary | ICD-10-CM | POA: Diagnosis present

## 2021-04-21 DIAGNOSIS — K769 Liver disease, unspecified: Secondary | ICD-10-CM | POA: Diagnosis not present

## 2021-04-21 MED ORDER — GADOXETATE DISODIUM 0.25 MMOL/ML IV SOLN
10.0000 mL | Freq: Once | INTRAVENOUS | Status: AC | PRN
  Administered 2021-04-21: 10 mL via INTRAVENOUS

## 2021-05-19 ENCOUNTER — Ambulatory Visit (INDEPENDENT_AMBULATORY_CARE_PROVIDER_SITE_OTHER): Payer: Medicaid Other | Admitting: Family Medicine

## 2021-05-19 VITALS — BP 114/58 | HR 70 | Wt 220.0 lb

## 2021-05-19 DIAGNOSIS — M5442 Lumbago with sciatica, left side: Secondary | ICD-10-CM | POA: Diagnosis present

## 2021-05-19 DIAGNOSIS — R300 Dysuria: Secondary | ICD-10-CM | POA: Diagnosis not present

## 2021-05-19 LAB — POCT URINALYSIS DIP (MANUAL ENTRY)
Bilirubin, UA: NEGATIVE
Glucose, UA: NEGATIVE mg/dL
Ketones, POC UA: NEGATIVE mg/dL
Nitrite, UA: NEGATIVE
Protein Ur, POC: NEGATIVE mg/dL
Spec Grav, UA: 1.015 (ref 1.010–1.025)
Urobilinogen, UA: 0.2 E.U./dL
pH, UA: 5.5 (ref 5.0–8.0)

## 2021-05-19 LAB — POCT UA - MICROSCOPIC ONLY

## 2021-05-19 MED ORDER — DICLOFENAC SODIUM 1 % EX GEL: 2.0000 g | Freq: Four times a day (QID) | CUTANEOUS | 3 refills | Status: DC

## 2021-05-19 NOTE — Assessment & Plan Note (Addendum)
Patient initially denies any issues with urination but through further investigation is having intermittent burning.  Urinalysis showing leukocytes and blood but otherwise normal.  Patient is just finishing her menstrual cycle which is likely contributing to the blood.  We will send for culture and treat if needed.  If dysuria continues or symptoms worsen plan on treating regardless of culture results.  Return precautions given. ?

## 2021-05-19 NOTE — Addendum Note (Signed)
Addended by: [place] Pink on: 05/19/2021 10:44 AM ? ? Modules accepted: Orders ? ?

## 2021-05-19 NOTE — Progress Notes (Signed)
? ? ?  SUBJECTIVE:  ? ?CHIEF COMPLAINT / HPI:  ? ?Low back pain ?Patient presents for low back pain today.  She reports that she noticed mild low back pain starting approximately 1 month ago.  She started working out to lose weight 1 month ago as well.  She reports that it was steady and did not require treatment but for the last 4 days has been seriously hurting.  She is taking Tylenol once daily with relief.  Denies any recent injuries or falls where she landed on her back.  Denies any bowel or bladder dysfunction although she does report some intermittent burning with urination and what she reports as "kidney pain".  Denies any recent history of UTIs.  Reports that she just finished her period and has Mirena for birth control. ? ? ?OBJECTIVE:  ? ?BP (!) 114/58   Pulse 70   Wt 220 lb (99.8 kg)   LMP 05/11/2021   SpO2 99%   BMI 37.76 kg/m?   ?General: Pleasant 36 year old female in no acute distress ?Cardiac: Regular rate and rhythm ?Respiratory: Breathing, lungs clear to auscultation bilaterally ?Abdomen: Soft, nontender, positive bowel sounds ?MSK: Spinous process tenderness in the lumbar region, left-sided costovertebral angle tenderness although patient reports it hurts in the middle of her back when I tap ? ?ASSESSMENT/PLAN:  ? ?Midline low back pain with left-sided sciatica ?This pain is most likely musculoskeletal with sciatica.  No red flag symptoms although patient does have bony tenderness in the midline without paraspinal tenderness.  Recommended continued use of Tylenol but scheduled 3 times a day as well as prescribed Voltaren gel.  Feel that imaging is appropriate at this time so have ordered lumbar imaging.  Discussed strict return precautions and follow-up as needed. ? ?Dysuria ?Patient initially denies any issues with urination but through further investigation is having intermittent burning.  Urinalysis showing leukocytes and blood but otherwise normal.  Patient is just finishing her menstrual  cycle which is likely contributing to the blood.  We will send for culture and treat if needed.  If dysuria continues or symptoms worsen plan on treating regardless of culture results.  Return precautions given. ?  ? ? ?Gifford Shave, MD ?[place]  ? ?

## 2021-05-19 NOTE — Patient Instructions (Signed)
It was wonderful seeing you today.  I believe your back pain is most likely muscular but because of where the pain is located I would like to get some imaging of your back.  I have placed an order for this and you can go to [facility] imaging to have it completed.  I would like for you to start taking Tylenol scheduled 3 times a day for 5 days or so and I have sent a prescription for Voltaren gel which you can apply to your back twice daily.  If you have any issues or concerns please call our clinic.  I hope you have a wonderful day! ? ? ?

## 2021-05-19 NOTE — Assessment & Plan Note (Signed)
This pain is most likely musculoskeletal with sciatica.  No red flag symptoms although patient does have bony tenderness in the midline without paraspinal tenderness.  Recommended continued use of Tylenol but scheduled 3 times a day as well as prescribed Voltaren gel.  Feel that imaging is appropriate at this time so have ordered lumbar imaging.  Discussed strict return precautions and follow-up as needed. ?

## 2021-05-20 ENCOUNTER — Ambulatory Visit
Admission: RE | Admit: 2021-05-20 | Discharge: 2021-05-20 | Disposition: A | Discharge status: Home / Self Care | Payer: Medicaid Other | Source: Ambulatory Visit | Attending: Family Medicine | Admitting: Family Medicine

## 2021-05-20 DIAGNOSIS — M5442 Lumbago with sciatica, left side: Secondary | ICD-10-CM

## 2021-05-21 LAB — URINE CULTURE: Organism ID, Bacteria: NO GROWTH

## 2021-08-13 ENCOUNTER — Encounter: Payer: Self-pay | Admitting: Family Medicine

## 2021-08-13 ENCOUNTER — Ambulatory Visit (INDEPENDENT_AMBULATORY_CARE_PROVIDER_SITE_OTHER): Payer: Medicaid Other | Admitting: Family Medicine

## 2021-08-13 DIAGNOSIS — J309 Allergic rhinitis, unspecified: Secondary | ICD-10-CM | POA: Insufficient documentation

## 2021-08-13 MED ORDER — FLUTICASONE PROPIONATE 50 MCG/ACT NA SUSP: 2.0000 | Freq: Every day | NASAL | 1 refills | Status: DC

## 2021-08-13 NOTE — Patient Instructions (Signed)
Good to see you today - Thank you for coming in!  Things we discussed today:  Your sinus pain is from Allergic Sinusitis, an allergic reaction affecting your nose and sinuses. Start using Flonase, spray twice in both nostrils every day.  Keep taking Tylenol for your headaches.  If your sinus pressure does not improve in 2 weeks or if you start having increased pain, please come back to clinic.

## 2021-08-13 NOTE — Assessment & Plan Note (Signed)
1 month hx L sinus pressure and L sided HA. Associated with increased L nasal drainage. HA are pulsing and associated with blurry vision during acute HAs. Denies fever or cough. Taking Zyrtec with no benefit. Tylenol helps HA's. VSS. Exam notable for tender over L sinuses and L parietal scalp. Given lack of infectious signs (fever, purulent drainage, lymphadenopathy), this is most likely allergic sinusitis leading to tension-type HA.  - Start Flonase daily - Cont Zyrtec daily - Cont Tylenol prn for HA - Given return precautions

## 2021-08-13 NOTE — Progress Notes (Addendum)
avs   SUBJECTIVE:   CHIEF COMPLAINT / HPI: Sinus pressure  Marissa Carpenter is 36yo F h/f 1 month hx of sinus pressure and pain. Pain/pressure starts primarily on L sided sinus then spreads behind eyes to affect entire l side of head, leading to a headache. Pt describes HA as pulsing, pounding, and associated with blurry vision during HA's. Pt also reports increase drainage from L nostril and sore throat. Denies fever or cough. Reports hx of mild seasonal allergies during spring, but never as severe as this. Pt is taking Zyrtec without improvement. Tylenol helps HA's a little.   Pt also reports a surgery at 36yo to remove cartilage in nose/sinuses.   PERTINENT  PMH / PSH: As above  OBJECTIVE:   BP (!) 111/59   Pulse 81   Temp 98.1 F (36.7 C)   Ht 5\' 4"  (1.626 m)   Wt 227 lb 9.6 oz (103.2 kg)   SpO2 100%   BMI 39.07 kg/m   Gen: Friendly, pleasant Marissa Carpenter resting comfortably HEENT: Tenderness over L maxillary sinuses, L temple, and L parietal scalp. Oropharynx nonerythematous, no cobbling. No lymphadenopathy. TM pearly, nonbulging BL.  Pulm: Normal WOB   ASSESSMENT/PLAN:   Allergic sinusitis 1 month hx L sinus pressure and L sided HA. Associated with increased L nasal drainage. HA are pulsing and associated with blurry vision during acute HAs. Denies fever or cough. Taking Zyrtec with no benefit. Tylenol helps HA's. VSS. Exam notable for tender over L sinuses and L parietal scalp. Given lack of infectious signs (fever, purulent drainage, lymphadenopathy), this is most likely allergic sinusitis leading to tension-type HA.  - Start Flonase daily - Cont Zyrtec daily - Cont Tylenol prn for HA - Given return precautions     09-17-1972, MD [facility]

## 2021-08-17 ENCOUNTER — Ambulatory Visit (INDEPENDENT_AMBULATORY_CARE_PROVIDER_SITE_OTHER): Payer: Medicaid Other

## 2021-08-17 ENCOUNTER — Encounter (HOSPITAL_COMMUNITY): Payer: Self-pay

## 2021-08-17 ENCOUNTER — Ambulatory Visit (HOSPITAL_COMMUNITY)
Admission: EM | Admit: 2021-08-17 | Discharge: 2021-08-17 | Disposition: A | Discharge status: Home / Self Care | Payer: Medicaid Other | Attending: Physician Assistant | Admitting: Physician Assistant

## 2021-08-17 DIAGNOSIS — M25562 Pain in left knee: Secondary | ICD-10-CM

## 2021-08-17 DIAGNOSIS — M79605 Pain in left leg: Secondary | ICD-10-CM

## 2021-08-17 MED ORDER — IBUPROFEN 600 MG PO TABS: 600.0000 mg | ORAL_TABLET | Freq: Four times a day (QID) | ORAL | 0 refills | Status: DC | PRN

## 2021-08-17 NOTE — ED Provider Notes (Signed)
[facility]    CSN: [id number] Arrival date & time: 08/17/21  1704      History   Chief Complaint Chief Complaint  Patient presents with   Knee Pain    HPI Marissa Carpenter is a 36 y.o. female.   Patient presents today with a several month history of intermittent left knee pain.  Over the past week this has significantly worsened and 2 days ago she developed a swelling/knot in the popliteal fossa of her left leg.  She reports that pain is rated 8 at rest but increases to 10 with attempted ambulation, localized to the medial left knee with radiation to popliteal fossa, described as aching, no aggravating relieving factors notified.  Denies any popping, clicking, instability.  Denies any known injury or increase in activity prior to symptom onset.  She denies any history of VTE event or significant risk factors including recent COVID-19 infection, OCP use, active malignancy, recent immobilization, recent travel, recent surgery.  She has not been taking any over-the-counter medication for symptom management.  She has tried topical IcyHot with minimal relief.  She is having difficulty with daily activities as result of symptoms.  She is confident that she is not pregnant.    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   Dysuria 05/19/2021   Cervical cancer screening 02/27/2021   Screening examination for sexually transmitted disease 02/27/2021   Hemorrhoids 01/01/2021   Chronic constipation 12/10/2020   Eczema 12/05/2018   S/P repeat low transverse C-section 03/16/2016   Obesity (BMI 30-39.9) 01/15/2015   Acanthosis nigricans 08/08/2013   Midline low back pain with  left-sided sciatica 10/20/2012   Hyperlipidemia 03/04/2011    Past Surgical History:  Procedure Laterality Date   CESAREAN SECTION N/A 05/09/2012   Procedure: Primary CESAREAN SECTION of baby boy  at 0435 APGAR 9/9;  Surgeon: Lavina Hamman, MD;  Location: [facility];  Service: Obstetrics;  Laterality: N/A;   CESAREAN SECTION N/A 05/18/2013   Procedure: REPEAT CESAREAN SECTION;  Surgeon: Oliver Pila, MD;  Location: [facility];  Service: Obstetrics;  Laterality: N/A;   CESAREAN SECTION N/A 03/16/2016   Procedure: REPEAT CESAREAN SECTION;  Surgeon: Huel Cote, MD;  Location: [facility];  Service: Obstetrics;  Laterality: N/A;  MD requests RNFA   TONSILLECTOMY      OB History     Gravida  5   Para  3   Term  3   Preterm      AB  2   Living  3      SAB  2   IAB      Ectopic      Multiple      Live Births  3            Home Medications    Prior to Admission medications   Medication Sig Start Date End Date Taking? Authorizing Provider  ibuprofen (ADVIL) 600 MG tablet Take 1 tablet (600 mg total) by mouth every 6 (six) hours as needed. 08/17/21  Yes Lara Palinkas, Noberto Retort, PA-C  acetaminophen (TYLENOL) 325 MG tablet Take 2 tablets (650 mg total) by  mouth every 6 (six) hours as needed. Do not take more than 4000mg  of tylenol per day 06/11/17   Couture, Cortni S, PA-C  diclofenac Sodium (VOLTAREN) 1 % GEL Apply 2 g topically 4 (four) times daily. 05/19/21   Cresenzo, Angelyn Punt, MD  fluticasone (FLONASE) 50 MCG/ACT nasal spray Place 2 sprays into both nostrils daily. 08/13/21   Arlyce Dice, MD  paragard intrauterine copper IUD IUD ParaGard T 380A 380 square mm intrauterine device  Take 1 device by intrauterine route.    [provider]  polyethylene glycol powder (GLYCOLAX/MIRALAX) 17 GM/SCOOP powder Take one half scoop or one half package with water daily Patient taking differently: Take one half scoop or one half package with water daily PRN 12/11/20   Dickie La, MD  triamcinolone ointment (KENALOG) 0.1 % Apply 1 application topically 2 (two) times daily. 04/16/20   Lyndee Hensen, DO  levonorgestrel (MIRENA, 52 MG,) 20 MCG/24HR IUD Mirena 20 mcg/24 hours (6 yrs) 52 mg intrauterine device  Take 1 device by intrauterine route.  11/15/19  [provider]  ranitidine (ZANTAC) 150 MG tablet Take 1 tablet (150 mg total) by mouth 2 (two) times daily. Patient not taking: Reported on 01/13/2018 08/11/15 11/15/19  Rasch, Artist Pais, NP    Family History Family History  Problem Relation Age of Onset   Alcohol abuse Father    Diabetes Father    Hypertension Father    Miscarriages / [place] Sister    Clotting disorder Sister    Colon cancer Neg Hx    Pancreatic cancer Neg Hx    Stomach cancer Neg Hx     Social History Social History   Tobacco Use   Smoking status: Never   Smokeless tobacco: Never  Vaping Use   Vaping Use: Never used  Substance Use Topics   Alcohol use: No   Drug use: No     Allergies   Patient has no known allergies.   Review of Systems Review of Systems  Constitutional:  Positive for activity change. Negative for appetite change, fatigue and fever.  Gastrointestinal:  Negative for abdominal pain, diarrhea, nausea and vomiting.  Musculoskeletal:  Positive for arthralgias, gait problem and joint swelling. Negative for myalgias.  Neurological:  Negative for dizziness, weakness, light-headedness, numbness and headaches.     Physical Exam Triage Vital Signs ED Triage Vitals [08/17/21 1902]  Enc Vitals Group     BP 124/88     Pulse Rate 72     Resp 18     Temp 98.3 F (36.8 C)     Temp Source Oral     SpO2 100 %     Weight      Height      Head Circumference      Peak Flow      Pain Score 8     Pain Loc      Pain Edu?      Excl. in [place]?    No data found.  Updated Vital Signs BP 124/88 (BP Location: Left Arm)   Pulse 72   Temp 98.3 F (36.8 C) (Oral)   Resp 18   SpO2 100%   Visual  Acuity Right Eye Distance:   Left Eye Distance:   Bilateral Distance:    Right Eye Near:   Left Eye Near:    Bilateral Near:     Physical Exam Vitals reviewed.  Constitutional:      General: She is awake. She is not in  acute distress.    Appearance: Normal appearance. She is well-developed. She is not ill-appearing.     Comments: Very pleasant female appears stated age in no acute distress sitting comfortably in exam room  HENT:     Head: Normocephalic and atraumatic.  Cardiovascular:     Rate and Rhythm: Normal rate and regular rhythm.     Heart sounds: Normal heart sounds, S1 normal and S2 normal. No murmur heard. Pulmonary:     Effort: Pulmonary effort is normal.     Breath sounds: Normal breath sounds. No wheezing, rhonchi or rales.     Comments: Clear to auscultation bilaterally Musculoskeletal:     Left knee: Effusion present. No swelling. Decreased range of motion. Tenderness present over the medial joint line. No LCL laxity, MCL laxity, ACL laxity or PCL laxity.    Right lower leg: No edema.     Left lower leg: No edema.     Comments: Left knee: Effusion noted.  Decreased range of motion with flexion.  Swelling noted popliteal fossa.  No ligamentous laxity on exam.  Psychiatric:        Behavior: Behavior is cooperative.      UC Treatments / Results  Labs (all labs ordered are listed, but only abnormal results are displayed) Labs Reviewed - No data to display  EKG   Radiology DG Knee Complete 4 Views Left  Result Date: 08/17/2021 CLINICAL DATA:  Left knee pain and swelling. Knot behind the knee for 2 days. EXAM: LEFT KNEE - COMPLETE 4+ VIEW COMPARISON:  None Available. FINDINGS: No evidence of fracture, dislocation, or joint effusion. No evidence of arthropathy or other focal bone abnormality. Soft tissues are unremarkable. IMPRESSION: Negative. Electronically Signed   By: Burman Nieves M.D.   On: 08/17/2021 19:31    Procedures Procedures (including critical  care time)  Medications Ordered in UC Medications - No data to display  Initial Impression / Assessment and Plan / UC Course  I have reviewed the triage vital signs and the nursing notes.  Pertinent labs & imaging results that were available during my care of the patient were reviewed by me and considered in my medical decision making (see chart for details).     Patient is well-appearing, afebrile, nontoxic, nontachycardic, with oxygen saturation of 100%.  Low suspicion for VTE event but given tenderness in popliteal fossa and lower leg tenderness will obtain outpatient ultrasound to rule this out.  Patient will go tomorrow to have this tomorrow.  Suspect that swelling is Baker's cyst.  X-ray was obtained today which showed no acute osseous abnormality.  Patient was prescribed ibuprofen for pain relief with instruction not to take NSAIDs with this medication due to risk of GI bleeding.  She can use Tylenol for breakthrough pain.  She was placed in a brace for comfort and support.  Discussed that if her ultrasound is negative and she continues to have pain she should follow-up with orthopedics and was given contact information for local provider with instruction to call to schedule an appointment.  She should use RICE protocol for additional symptom relief.  Discussed that if she has any worsening symptoms including increased swelling, increased pain, difficulty ambulating, chest pain, shortness of breath she needs to go to the emergency room.  Strict return precautions given.  Work excuse note provided.  Final Clinical Impressions(s) / UC Diagnoses   Final diagnoses:  Acute pain of left knee  Left leg pain     Discharge Instructions  Your x-ray was normal.  Use the brace for comfort.  Go get ultrasound tomorrow to rule out a blood clot.  We will contact you if this is positive.  Start ibuprofen for pain relief.  Do not take NSAIDs with this medication including aspirin, ibuprofen/Advil,  naproxen/Aleve.  You can use Tylenol for breakthrough pain.  If everything is negative and you continue to have pain please contact orthopedics to schedule an appointment.     ED Prescriptions     Medication Sig Dispense Auth. Provider   ibuprofen (ADVIL) 600 MG tablet Take 1 tablet (600 mg total) by mouth every 6 (six) hours as needed. 30 tablet Jeronica Stlouis, Derry Skill, PA-C      PDMP not reviewed this encounter.   Terrilee Croak, PA-C 08/17/21 2000

## 2021-08-17 NOTE — ED Triage Notes (Signed)
Pt c/o lt knee pain x2 days with a knot behind knee. States used icy hot with no relief. Denies long car/airplane rides.

## 2021-08-17 NOTE — Discharge Instructions (Addendum)
Your x-ray was normal.  Use the brace for comfort.  Go get ultrasound tomorrow to rule out a blood clot.  We will contact you if this is positive.  Start ibuprofen for pain relief.  Do not take NSAIDs with this medication including aspirin, ibuprofen/Advil, naproxen/Aleve.  You can use Tylenol for breakthrough pain.  If everything is negative and you continue to have pain please contact orthopedics to schedule an appointment.

## 2021-08-18 ENCOUNTER — Ambulatory Visit (HOSPITAL_COMMUNITY)
Admission: RE | Admit: 2021-08-18 | Discharge: 2021-08-18 | Disposition: A | Discharge status: Home / Self Care | Payer: Medicaid Other | Source: Ambulatory Visit | Attending: Physician Assistant | Admitting: Physician Assistant

## 2021-08-18 DIAGNOSIS — M7989 Other specified soft tissue disorders: Secondary | ICD-10-CM | POA: Insufficient documentation

## 2021-08-18 DIAGNOSIS — M79605 Pain in left leg: Secondary | ICD-10-CM | POA: Insufficient documentation

## 2021-08-18 NOTE — Progress Notes (Signed)
Left lower extremity venous duplex completed. Refer to "CV Proc" under chart review to view preliminary results.  08/18/2021 12:16 PM Eula Fried., MHA, RVT, RDCS, RDMS

## 2021-09-02 ENCOUNTER — Encounter: Payer: Self-pay | Admitting: Student

## 2021-09-02 ENCOUNTER — Ambulatory Visit (INDEPENDENT_AMBULATORY_CARE_PROVIDER_SITE_OTHER): Payer: Medicaid Other | Admitting: Student

## 2021-09-02 VITALS — BP 110/58 | HR 73 | Ht 64.0 in

## 2021-09-02 DIAGNOSIS — M25562 Pain in left knee: Secondary | ICD-10-CM

## 2021-09-02 DIAGNOSIS — E785 Hyperlipidemia, unspecified: Secondary | ICD-10-CM

## 2021-09-02 DIAGNOSIS — Z0000 Encounter for general adult medical examination without abnormal findings: Secondary | ICD-10-CM | POA: Diagnosis present

## 2021-09-02 DIAGNOSIS — K7689 Other specified diseases of liver: Secondary | ICD-10-CM | POA: Diagnosis not present

## 2021-09-02 NOTE — Progress Notes (Signed)
SUBJECTIVE:   CHIEF COMPLAINT / HPI:   Left leg pain:  Patient presents for f/u with several months of left knee pain Patient presented to urgent care center on 08/13/2021 with swelling and a "knot" in the left leg. At that time she did deny popping, clicking, instability Denies any known injury to cause the onset At the urgent care, they obtained an x-ray that was unremarkable and suspected the swelling to be a Baker's cyst Patient was prescribed ibuprofen for pain relief and Tylenol for breakthrough She was also given a brace and instructed to use RICE protocol Flexion of knee feels heavy  Pain comes and goes  DVT u/s negative 08/18/21 for popliteal cyst or DVT    Healthcare maintenance: Patient has never been tested for hep C and has had elevated cholesterol in the past approximately 10 years ago, needs repeat.  PERTINENT  PMH / PSH:   Past Medical History:  Diagnosis Date   Diabetes mellitus without complication (HCC)    Gestational diabetes    Headache(784.0)    otc med prn   Left facial pain 09/17/2015   Miscarriage 08/26/2010   SAB (spontaneous abortion) 2012   x 2 in 2012 - no surgery required    OBJECTIVE:  BP (!) 110/58   Pulse 73   Ht 5\' 4"  (1.626 m)   SpO2 99%   BMI 39.07 kg/m   General: NAD, pleasant, able to participate in exam Cardiac: RRR, no murmurs auscultated Respiratory: CTAB, normal WOB Abdomen: soft, non-tender, non-distended, normoactive bowel sounds Extremities: warm and well perfused, no edema or cyanosis  Left knee: Appropriate flexion/extension of the knee without crepitus.  Mild tenderness over the medial aspect.  Good patellar tracking.  Negative Thessaly's, negative McMurray's, good stability of the knee with Lachman's.  Small mobile nonfluctuant mass in the posterior aspect, able to palpate popliteal pulse Skin: warm and dry, no rashes noted Neuro: alert, no obvious focal deficits, speech normal Psych: Normal affect and  mood  ASSESSMENT/PLAN:  Hyperlipidemia Needs repeat check today, last check elevated LDL to 110 in 2013. Patient amenable to recheck   Left knee pain Most likely related to Baker's cyst given physical exam. Differential includes cystic masses, DVT, aneurysm, although all of these are unlikely given patient's history and exam without any increased risk of DVT or aneurysms and U/s negative  Concern for other findings including meniscal injury vs OA possibly contributing to formation of Baker's cyst -Continue with ambulatory referral to sports medicine to consider aspiration of cyst and intra-articular steroid injection -Left knee MRI ordered for further assessment  -Continue Ibuprofen and Tylenol   Focal nodular hyperplasia of liver Present on CT scan of abdomen/pelvis, at the time symptomatic with abdominal pain but not complaining of this today Last CMP obtained 8 months ago with unremarkable liver enzymes Repeat CMP today to assess Has not had hep C, will check this as well   Orders Placed This Encounter  Procedures   MR Knee Left  Wo Contrast    Standing Status:   Future    Standing Expiration Date:   09/03/2022    Order Specific Question:   What is the patient's sedation requirement?    Answer:   No Sedation    Order Specific Question:   Does the patient have a pacemaker or implanted devices?    Answer:   No    Order Specific Question:   Preferred imaging location?    Answer:   [facility] (table limit -  500 lbs)   Lipid Panel   Hepatitis C Antibody   Comprehensive metabolic panel   Ambulatory referral to Sports Medicine    Referral Priority:   Routine    Referral Type:   Consultation    Number of Visits Requested:   1   No orders of the defined types were placed in this encounter.  Return in about 2 months (around 11/02/2021) for knee pain. Alfredo Martinez, MD 09/02/2021, 11:05 AM PGY-1, [facility]

## 2021-09-02 NOTE — Patient Instructions (Addendum)
It was great to see you today! Thank you for choosing [facility] for your primary care. Marissa Carpenter was seen for left knee pain.  Today we addressed: That this is possibly something called a baker's cyst, which can cause significant pain  Continue with Ibuprofen and Tylenol as previously instructed  We will continue with a referral to sports medicine/ MRI of the left knee  Sports medicine will discuss options for therapeutic therapy    You will need hep C screening at some point, please call and request for a lab if you would like this  We will also need to obtain a cholesterol check to see where you stand as it has been elevated in the past.  I will also check your liver levels   If you haven't already, sign up for My Chart to have easy access to your labs results, and communication with your primary care physician.  We are checking some labs today. If they are abnormal, I will call you. If they are normal, I will send you a MyChart message (if it is active) or a letter in the mail. If you do not hear about your labs in the next 2 weeks, please call the office.   You should return to our clinic Return in about 2 months (around 11/02/2021) for knee pain.  I recommend that you always bring your medications to each appointment as this makes it easy to ensure you are on the correct medications and helps [place] not miss refills when you need them.  Please arrive 15 minutes before your appointment to ensure smooth check in process.  We appreciate your efforts in making this happen.  Please call the clinic at [phone] if your symptoms worsen or you have any concerns.  Thank you for allowing me to participate in your care, Alfredo Martinez, MD 09/02/2021, 9:15 AM PGY-2, [facility]

## 2021-09-02 NOTE — Assessment & Plan Note (Signed)
Present on CT scan of abdomen/pelvis, at the time symptomatic with abdominal pain but not complaining of this today Last CMP obtained 8 months ago with unremarkable liver enzymes Repeat CMP today to assess Has not had hep C, will check this as well

## 2021-09-02 NOTE — Assessment & Plan Note (Addendum)
Needs repeat check today, last check elevated LDL to 110 in 2013. Patient amenable to recheck

## 2021-09-02 NOTE — Assessment & Plan Note (Addendum)
Most likely related to Baker's cyst given physical exam. Differential includes cystic masses, DVT, aneurysm, although all of these are unlikely given patient's history and exam without any increased risk of DVT or aneurysms and U/s negative  Concern for other findings including meniscal injury vs OA possibly contributing to formation of Baker's cyst -Continue with ambulatory referral to sports medicine to consider aspiration of cyst and intra-articular steroid injection -Left knee MRI ordered for further assessment  -Continue Ibuprofen and Tylenol

## 2021-09-03 LAB — LIPID PANEL
Chol/HDL Ratio: 3.9 ratio (ref 0.0–4.4)
Cholesterol, Total: 212 mg/dL — ABNORMAL HIGH (ref 100–199)
HDL: 54 mg/dL (ref >39)
LDL Chol Calc (NIH): 133 mg/dL — ABNORMAL HIGH (ref 0–99)
Triglycerides: 138 mg/dL (ref 0–149)
VLDL Cholesterol Cal: 25 mg/dL (ref 5–40)

## 2021-09-03 LAB — COMPREHENSIVE METABOLIC PANEL
ALT: 12 IU/L (ref 0–32)
AST: 12 IU/L (ref 0–40)
Albumin/Globulin Ratio: 1.8 (ref 1.2–2.2)
Albumin: 4.4 g/dL (ref 3.9–4.9)
Alkaline Phosphatase: 67 IU/L (ref 44–121)
BUN/Creatinine Ratio: 16 (ref 9–23)
BUN: 12 mg/dL (ref 6–20)
Bilirubin Total: 0.2 mg/dL (ref 0.0–1.2)
CO2: 21 mmol/L (ref 20–29)
Calcium: 9.5 mg/dL (ref 8.7–10.2)
Chloride: 101 mmol/L (ref 96–106)
Creatinine, Ser: 0.75 mg/dL (ref 0.57–1.00)
Globulin, Total: 2.5 g/dL (ref 1.5–4.5)
Glucose: 100 mg/dL — ABNORMAL HIGH (ref 70–99)
Potassium: 4.4 mmol/L (ref 3.5–5.2)
Sodium: 138 mmol/L (ref 134–144)
Total Protein: 6.9 g/dL (ref 6.0–8.5)
eGFR: 106 mL/min/{1.73_m2} (ref >59)

## 2021-09-03 LAB — HEPATITIS C ANTIBODY: Hep C Virus Ab: NONREACTIVE

## 2021-09-06 ENCOUNTER — Encounter (HOSPITAL_COMMUNITY): Payer: Self-pay

## 2021-09-06 ENCOUNTER — Emergency Department (HOSPITAL_COMMUNITY)
Admission: EM | Admit: 2021-09-06 | Discharge: 2021-09-06 | Disposition: A | Discharge status: Home / Self Care | Payer: Medicaid Other | Attending: Emergency Medicine | Admitting: Emergency Medicine

## 2021-09-06 ENCOUNTER — Emergency Department (HOSPITAL_COMMUNITY): Payer: Medicaid Other

## 2021-09-06 ENCOUNTER — Other Ambulatory Visit: Payer: Self-pay

## 2021-09-06 DIAGNOSIS — H538 Other visual disturbances: Secondary | ICD-10-CM | POA: Diagnosis not present

## 2021-09-06 DIAGNOSIS — R519 Headache, unspecified: Secondary | ICD-10-CM | POA: Insufficient documentation

## 2021-09-06 DIAGNOSIS — N9489 Other specified conditions associated with female genital organs and menstrual cycle: Secondary | ICD-10-CM | POA: Diagnosis not present

## 2021-09-06 DIAGNOSIS — E876 Hypokalemia: Secondary | ICD-10-CM | POA: Diagnosis not present

## 2021-09-06 LAB — COMPREHENSIVE METABOLIC PANEL
ALT: 17 U/L (ref 0–44)
AST: 15 U/L (ref 15–41)
Albumin: 4 g/dL (ref 3.5–5.0)
Alkaline Phosphatase: 52 U/L (ref 38–126)
Anion gap: 7 (ref 5–15)
BUN: 12 mg/dL (ref 6–20)
CO2: 26 mmol/L (ref 22–32)
Calcium: 9.1 mg/dL (ref 8.9–10.3)
Chloride: 108 mmol/L (ref 98–111)
Creatinine, Ser: 0.71 mg/dL (ref 0.44–1.00)
GFR, Estimated: >60 mL/min (ref >60)
Glucose, Bld: 105 mg/dL — ABNORMAL HIGH (ref 70–99)
Potassium: 3.3 mmol/L — ABNORMAL LOW (ref 3.5–5.1)
Sodium: 141 mmol/L (ref 135–145)
Total Bilirubin: 0.5 mg/dL (ref 0.3–1.2)
Total Protein: 7.6 g/dL (ref 6.5–8.1)

## 2021-09-06 LAB — CBC WITH DIFFERENTIAL/PLATELET
Abs Immature Granulocytes: 0.02 10*3/uL (ref 0.00–0.07)
Basophils Absolute: 0 10*3/uL (ref 0.0–0.1)
Basophils Relative: 0 %
Eosinophils Absolute: 0.1 10*3/uL (ref 0.0–0.5)
Eosinophils Relative: 2 %
HCT: 36.2 % (ref 36.0–46.0)
Hemoglobin: 11.6 g/dL — ABNORMAL LOW (ref 12.0–15.0)
Immature Granulocytes: 0 %
Lymphocytes Relative: 31 %
Lymphs Abs: 2.1 10*3/uL (ref 0.7–4.0)
MCH: 24.9 pg — ABNORMAL LOW (ref 26.0–34.0)
MCHC: 32 g/dL (ref 30.0–36.0)
MCV: 77.8 fL — ABNORMAL LOW (ref 80.0–100.0)
Monocytes Absolute: 0.4 10*3/uL (ref 0.1–1.0)
Monocytes Relative: 6 %
Neutro Abs: 4.2 10*3/uL (ref 1.7–7.7)
Neutrophils Relative %: 61 %
Platelets: 284 10*3/uL (ref 150–400)
RBC: 4.65 MIL/uL (ref 3.87–5.11)
RDW: 15.3 % (ref 11.5–15.5)
WBC: 7 10*3/uL (ref 4.0–10.5)
nRBC: 0 % (ref 0.0–0.2)

## 2021-09-06 LAB — I-STAT BETA HCG BLOOD, ED (MC, WL, AP ONLY): I-stat hCG, quantitative: <5 m[IU]/mL (ref <5)

## 2021-09-06 MED ORDER — POTASSIUM CHLORIDE CRYS ER 20 MEQ PO TBCR
20.0000 meq | EXTENDED_RELEASE_TABLET | Freq: Once | ORAL | Status: AC
Start: 2021-09-06 — End: 2021-09-06
  Administered 2021-09-06: 20 meq via ORAL
  Filled 2021-09-06: qty 1

## 2021-09-06 MED ORDER — DIPHENHYDRAMINE HCL 50 MG/ML IJ SOLN
12.5000 mg | Freq: Once | INTRAMUSCULAR | Status: AC
  Administered 2021-09-06: 12.5 mg via INTRAVENOUS
  Filled 2021-09-06: qty 1

## 2021-09-06 MED ORDER — METOCLOPRAMIDE HCL 5 MG/ML IJ SOLN
10.0000 mg | Freq: Once | INTRAMUSCULAR | Status: AC
  Administered 2021-09-06: 10 mg via INTRAVENOUS
  Filled 2021-09-06: qty 2

## 2021-09-06 MED ORDER — DEXAMETHASONE 2 MG PO TABS
10.0000 mg | ORAL_TABLET | Freq: Once | ORAL | Status: AC
  Administered 2021-09-06: 10 mg via ORAL
  Filled 2021-09-06: qty 1

## 2021-09-06 NOTE — ED Provider Notes (Signed)
[facility]EM[facility]ENCY [facility]PT Provider Note   CSN: [id number] Arrival date & time: 09/06/21  0039     History  Chief Complaint  Patient presents with   Headache    Marissa Carpenter is a 35 y.o. female.  The history is provided by the patient.  Headache Marissa Carpenter is a 36 y.o. female who presents to the Emergency Department complaining of headache.  She presents to the emergency department accompanied by a family member for evaluation of headache that has been waxing and waning for the last 2 months.  Headache is located around her eyes bilaterally.  Over the last week she has been experiencing this headache daily.  It is worse at night and morning.  Occasionally she has blurred vision.  She also reports some sinus pain and runny nose today.  No associated fevers, cough, shortness of breath.  She does have nausea but no vomiting.  No reports of injuries.  No other household members have headaches.   No known medical problems.  Takes tylenol - does not help.        Home Medications Prior to Admission medications   Medication Sig Start Date End Date Taking? Authorizing Provider  acetaminophen (TYLENOL) 325 MG tablet Take 2 tablets (650 mg total) by mouth every 6 (six) hours as needed. Do not take more than 4000mg  of tylenol per day 06/11/17  Yes Couture, Cortni S, PA-C  paragard intrauterine copper IUD IUD 1 each by Intrauterine route once.   Yes [provider]  ibuprofen (ADVIL) 600 MG tablet Take 1 tablet (600 mg total) by mouth every 6 (six) hours as needed. Patient not taking: Reported on 09/06/2021 08/17/21   Raspet, 08/19/21, PA-C  levonorgestrel (MIRENA, 52 MG,) 20 MCG/24HR IUD Mirena 20 mcg/24 hours (6 yrs) 52 mg intrauterine device  Take 1 device by intrauterine route.  11/15/19  [provider]  ranitidine (ZANTAC) 150 MG tablet Take 1 tablet (150 mg total) by mouth 2 (two) times daily. Patient not taking: Reported on 01/13/2018 08/11/15 11/15/19   Rasch, 11/17/19, NP      Allergies    Patient has no known allergies.    Review of Systems   Review of Systems  Neurological:  Positive for headaches.  All other systems reviewed and are negative.   Physical Exam Updated Vital Signs BP 132/78   Pulse 81   Temp 97.9 F (36.6 C) (Oral)   Resp 20   Ht 5\' 4"  (1.626 m)   Wt 95.3 kg   LMP 09/06/2021   SpO2 100%   BMI 36.05 kg/m  Physical Exam Vitals and nursing note reviewed.  Constitutional:      Appearance: She is well-developed.  HENT:     Head: Normocephalic and atraumatic.  Cardiovascular:     Rate and Rhythm: Normal rate and regular rhythm.     Heart sounds: No murmur heard. Pulmonary:     Effort: Pulmonary effort is normal. No respiratory distress.     Breath sounds: Normal breath sounds.  Abdominal:     Palpations: Abdomen is soft.     Tenderness: There is no abdominal tenderness. There is no guarding or rebound.  Musculoskeletal:        General: No tenderness.  Skin:    General: Skin is warm and dry.  Neurological:     Mental Status: She is alert and oriented to person, place, and time.     Comments: No asymmetry of facial movements.  Visual fields  grossly intact.  5/5 strength in all four extremities with sensation to light touch intact in all four extremities.   Psychiatric:        Behavior: Behavior normal.     ED Results / Procedures / Treatments   Labs (all labs ordered are listed, but only abnormal results are displayed) Labs Reviewed  COMPREHENSIVE METABOLIC PANEL - Abnormal; Notable for the following components:      Result Value   Potassium 3.3 (*)    Glucose, Bld 105 (*)    All other components within normal limits  CBC WITH DIFFERENTIAL/PLATELET - Abnormal; Notable for the following components:   Hemoglobin 11.6 (*)    MCV 77.8 (*)    MCH 24.9 (*)    All other components within normal limits  I-STAT BETA HCG BLOOD, ED ([facility], [facility], [facility] ONLY)    EKG None  Radiology CT Head Wo  Contrast  Result Date: 09/06/2021 CLINICAL DATA:  Severe headache x1 week. EXAM: CT HEAD WITHOUT CONTRAST TECHNIQUE: Contiguous axial images were obtained from the base of the skull through the vertex without intravenous contrast. RADIATION DOSE REDUCTION: This exam was performed according to the departmental dose-optimization program which includes automated exposure control, adjustment of the mA and/or kV according to patient size and/or use of iterative reconstruction technique. COMPARISON:  None Available. FINDINGS: Brain: No evidence of acute infarction, hemorrhage, hydrocephalus, extra-axial collection or mass lesion/mass effect. Vascular: No hyperdense vessel or unexpected calcification. Skull: Normal. Negative for fracture or focal lesion. Sinuses/Orbits: No acute finding. Other: None. IMPRESSION: No acute intracranial pathology. Electronically Signed   By: Aram Candela M.D.   On: 09/06/2021 04:20    Procedures Procedures    Medications Ordered in ED Medications  metoCLOPramide (REGLAN) injection 10 mg (10 mg Intravenous Given 09/06/21 0426)  diphenhydrAMINE (BENADRYL) injection 12.5 mg (12.5 mg Intravenous Given 09/06/21 0426)  potassium chloride SA (KLOR-CON M) CR tablet 20 mEq (20 mEq Oral Given 09/06/21 0517)  dexamethasone (DECADRON) tablet 10 mg (10 mg Oral Given 09/06/21 1751)    ED Course/ Medical Decision Making/ A&P                           Medical Decision Making Amount and/or Complexity of Data Reviewed Labs: ordered. Radiology: ordered.  Risk Prescription drug management.   Patient here for evaluation of 2 months of progressive headaches.  Intermittent blurred vision.  She is nontoxic-appearing on evaluation with no focal neurologic deficits.  Imaging is negative for acute abnormality.  Current clinical picture is not consistent with meningitis, subarachnoid hemorrhage, dural sinus thrombosis.  On repeat evaluation after medications in the department her headache is  resolved.  We will treat with dose of Decadron to prevent rebound headache.  Labs significant for mild anemia, mild hypokalemia.  Potassium replaced orally.  Discussed with patient home care for headache, PCP follow-up and return precautions.        Final Clinical Impression(s) / ED Diagnoses Final diagnoses:  Bad headache  Hypokalemia    Rx / DC Orders ED Discharge Orders     None         Tilden Fossa, MD 09/06/21 [phone]

## 2021-09-06 NOTE — ED Triage Notes (Signed)
Rubber band squeezing headache x 1 week.

## 2021-09-08 ENCOUNTER — Ambulatory Visit (INDEPENDENT_AMBULATORY_CARE_PROVIDER_SITE_OTHER): Payer: Medicaid Other | Admitting: Family Medicine

## 2021-09-08 VITALS — BP 128/70 | HR 61 | Ht 64.0 in | Wt 229.6 lb

## 2021-09-08 DIAGNOSIS — G44229 Chronic tension-type headache, not intractable: Secondary | ICD-10-CM

## 2021-09-08 MED ORDER — NAPROXEN 500 MG PO TABS: 500.0000 mg | ORAL_TABLET | Freq: Two times a day (BID) | ORAL | 0 refills | Status: DC

## 2021-09-08 NOTE — Progress Notes (Unsigned)
    SUBJECTIVE:   CHIEF COMPLAINT / HPI:   Headache -seen in the ED for headache on 8/13 -at that time had been present for 2 months -CT head was normal -resolved after reglan/benadryl/decadron  -still having headache -some blurry vision: wears glasses only when she watches TV -interfering with sleep this week -previously slept well, 7-8 hours -face pain as well, left more than right -stopped flonase and zyrtec after 2 days -taking tylenol or ibuprofen, every 6 hours for the past few days, helps temporarily -some reflux symptoms but no significant nausea or vomiting -no significant vision changes, no fever, no neck pain, no cough, no shortness of breath, no syncope, no weakness or numbness  PERTINENT  PMH / PSH: ***  OBJECTIVE:   BP 128/70   Pulse 61   Ht 5\' 4"  (1.626 m)   Wt 229 lb 9.6 oz (104.1 kg)   LMP 09/06/2021   SpO2 99%   BMI 39.41 kg/m   ***  ASSESSMENT/PLAN:   No problem-specific Assessment & Plan notes found for this encounter.     09/08/2021, MD [facility]

## 2021-09-08 NOTE — Patient Instructions (Addendum)
It was great to see you!  I'm sorry you're experiencing headaches. The good news is that your CT scan was normal (normal brain tissue, normal sinuses, normal bones etc).   We are going to try a 10 day course of prescription anti-inflammatory medication (Naproxen) to try to knock your headache down.  Take it twice daily for 10 days. Do not take Ibuprofen, Motrin, Advil, or Aleve while you're taking this medicine.  If you're still having symptoms in 2 weeks, see [place] back and we will consider a long-term daily medication instead.  Be sure to drink plenty of water, get 8 hours of sleep, and reduce stress as much as possible.  Take care, Dr Anner Crete   Tension Headache, Adult A tension headache is a feeling of pain, pressure, or aching in the head. It is often felt over the front and sides of the head. Tension headaches can last from 30 minutes to several days. What are the causes? The cause of this condition is not known. Sometimes, tension headaches are brought on by stress, worry (anxiety), or depression. Other things that may set them off include: Alcohol. Too much caffeine or caffeine withdrawal. Colds, flu, or sinus infections. Dental problems. This can include clenching your teeth. Being tired. Holding your head and neck in the same position for a long time, such as while using a computer. Smoking. Arthritis in the neck. What are the signs or symptoms? Feeling pressure around the head. A dull ache in the head. Pain over the front and sides of the head. Feeling sore or tender in the muscles of the head, neck, and shoulders. How is this treated? This condition may be treated with lifestyle changes and with medicines that help relieve symptoms. Follow these instructions at home: Managing pain Take over-the-counter and prescription medicines only as told by your doctor. When you have a headache, lie down in a dark, quiet room. If told, put ice on your head and neck. To do this: Put  ice in a plastic bag. Place a towel between your skin and the bag. Leave the ice on for 20 minutes, 2-3 times a day. Take off the ice if your skin turns bright red. This is very important. If you cannot feel pain, heat, or cold, you have a greater risk of damage to the area. If told, put heat on the back of your neck. Do this as often as told by your doctor. Use the heat source that your doctor recommends, such as a moist heat pack or a heating pad. Place a towel between your skin and the heat source. Leave the heat on for 20-30 minutes. Take off the heat if your skin turns bright red. This is very important. If you cannot feel pain, heat, or cold, you have a greater risk of getting burned. Eating and drinking Eat meals on a regular schedule. If you drink alcohol: Limit how much you have to: 0-1 drink a day for women who are not pregnant. 0-2 drinks a day for men. Know how much alcohol is in your drink. In the [place] one drink equals one 12 oz bottle of beer (355 mL), one 5 oz glass of wine (148 mL), or one 1 oz glass of hard liquor (44 mL). Drink enough fluid to keep your pee (urine) pale yellow. Do not use a lot of caffeine, or stop using caffeine. Lifestyle Get 7-9 hours of sleep each night. Or get the amount of sleep that your doctor tells you to. At bedtime,  keep computers, phones, and tablets out of your room. Find ways to lessen your stress. This may include: Exercise. Deep breathing. Yoga. Listening to music. Thinking positive thoughts. Sit up straight. Try to relax your muscles. Do not smoke or use any products that contain nicotine or tobacco. If you need help quitting, ask your doctor. General instructions  Avoid things that can bring on headaches. Keep a headache journal to see what may bring on headaches. For example, write down: What you eat and drink. How much sleep you get. Any change to your diet or medicines. Keep all follow-up visits. Contact a doctor if: Your  headache does not get better. Your headache comes back. You have a headache, and sounds, light, or smells bother you. You feel like you may vomit, or you vomit. Your stomach hurts. Get help right away if: You all of a sudden get a very bad headache with any of these things: A stiff neck. Feeling like you may vomit. Vomiting. Feeling mixed up (confused). Feeling weak in one part or one side of your body. Having trouble seeing or speaking, or both. Feeling short of breath. A rash. Feeling very sleepy. Pain in your eye or ear. Trouble walking or balancing. Feeling like you will faint, or you faint. Summary A tension headache is pain, pressure, or aching in your head. Tension headaches can last from 30 minutes to several days. Lifestyle changes and medicines may help relieve pain. This information is not intended to replace advice given to you by your health care provider. Make sure you discuss any questions you have with your health care provider. Document Revised: 10/11/2019 Document Reviewed: 10/11/2019 Elsevier Patient Education  2023 [employer].

## 2021-09-09 DIAGNOSIS — G44229 Chronic tension-type headache, not intractable: Secondary | ICD-10-CM | POA: Insufficient documentation

## 2021-09-09 NOTE — Assessment & Plan Note (Signed)
Presentation consistent with chronic tension-type headache. Reassuringly, patient had normal head CT in the ED. Doubt sinus etiology given normal CT. No history of medication overuse. -Trial 10 day course of Naproxen -Encouraged headache prevention measures (adequate sleep, hydration, reduce stress etc) -If no improvement in 2-3 weeks, consider starting daily amitriptyline

## 2021-09-13 ENCOUNTER — Ambulatory Visit (HOSPITAL_COMMUNITY)
Admission: RE | Admit: 2021-09-13 | Discharge: 2021-09-13 | Disposition: A | Discharge status: Home / Self Care | Payer: Medicaid Other | Source: Ambulatory Visit | Attending: Family Medicine | Admitting: Family Medicine

## 2021-09-13 DIAGNOSIS — M25562 Pain in left knee: Secondary | ICD-10-CM | POA: Insufficient documentation

## 2021-09-15 ENCOUNTER — Ambulatory Visit: Payer: Medicaid Other | Admitting: Sports Medicine

## 2021-10-23 ENCOUNTER — Encounter (HOSPITAL_COMMUNITY): Payer: Self-pay

## 2021-10-23 ENCOUNTER — Ambulatory Visit (HOSPITAL_COMMUNITY)
Admission: EM | Admit: 2021-10-23 | Discharge: 2021-10-23 | Disposition: A | Discharge status: Home / Self Care | Payer: Medicaid Other | Attending: Emergency Medicine | Admitting: Emergency Medicine

## 2021-10-23 DIAGNOSIS — J069 Acute upper respiratory infection, unspecified: Secondary | ICD-10-CM

## 2021-10-23 DIAGNOSIS — K2900 Acute gastritis without bleeding: Secondary | ICD-10-CM | POA: Insufficient documentation

## 2021-10-23 LAB — POCT URINALYSIS DIPSTICK, ED / UC
Bilirubin Urine: NEGATIVE
Glucose, UA: NEGATIVE mg/dL
Hgb urine dipstick: NEGATIVE
Ketones, ur: NEGATIVE mg/dL
Leukocytes,Ua: NEGATIVE
Nitrite: NEGATIVE
Protein, ur: NEGATIVE mg/dL
Specific Gravity, Urine: 1.01 (ref 1.005–1.030)
Urobilinogen, UA: 0.2 mg/dL (ref 0.0–1.0)
pH: 7 (ref 5.0–8.0)

## 2021-10-23 LAB — POCT RAPID STREP A, ED / UC: Streptococcus, Group A Screen (Direct): NEGATIVE

## 2021-10-23 LAB — POC URINE PREG, ED: Preg Test, Ur: NEGATIVE

## 2021-10-23 MED ORDER — FAMOTIDINE 20 MG PO TABS: 20.0000 mg | ORAL_TABLET | Freq: Two times a day (BID) | ORAL | 0 refills | Status: AC

## 2021-10-23 MED ORDER — ALUM & MAG HYDROXIDE-SIMETH 200-200-20 MG/5ML PO SUSP
30.0000 mL | Freq: Once | ORAL | Status: AC
  Administered 2021-10-23: 30 mL via ORAL

## 2021-10-23 MED ORDER — ALUM & MAG HYDROXIDE-SIMETH 200-200-20 MG/5ML PO SUSP
ORAL | Status: AC
  Filled 2021-10-23: qty 30

## 2021-10-23 NOTE — ED Triage Notes (Signed)
Pt is here for cough, nasal congestion, headache , sore throat, nausea, pt states she feels like something is in her throat x 2days

## 2021-10-23 NOTE — ED Provider Notes (Addendum)
[facility]    CSN: [id number] Arrival date & time: 10/23/21  1804      History   Chief Complaint Chief Complaint  Patient presents with   Cough    HPI Marissa Carpenter is a 36 y.o. female.   36 year old female, Marissa Carpenter, presents to the emergency room with chief complaint of epigastric pain.  Patient states she eats spicy food frequently.  States she has URI type symptoms cough, nasal congestion,Headache, sore throat x2 days and took Sudafed on empty stomach: wonders if this is the cause of her discomfort.  Patient denies any chest pain ,shortness of breath, palpitations,nausea or vomiting discussed with patient is more likely consistent with spicy food ingestion and gastritis.    The history is provided by the patient. No language interpreter was used.    Past Medical History:  Diagnosis Date   Diabetes mellitus without complication (HCC)    Gestational diabetes    Headache(784.0)    otc med prn   Left facial pain 09/17/2015   Miscarriage 08/26/2010   SAB (spontaneous abortion) 2012   x 2 in 2012 - no surgery required    Patient Active Problem List   Diagnosis Date Noted   Viral URI with cough 10/23/2021   Acute gastritis without hemorrhage 10/23/2021   Chronic tension-type headache, not intractable 09/09/2021   Left knee pain 09/02/2021   Focal nodular hyperplasia of liver 09/02/2021   Allergic sinusitis 08/13/2021   Dysuria 05/19/2021   Cervical cancer screening 02/27/2021   Screening examination for sexually transmitted disease 02/27/2021   Hemorrhoids 01/01/2021   Chronic constipation 12/10/2020   Eczema 12/05/2018   S/P repeat low transverse C-section 03/16/2016   Obesity (BMI 30-39.9) 01/15/2015   Acanthosis nigricans 08/08/2013   Midline low back pain with left-sided sciatica 10/20/2012   Hyperlipidemia 03/04/2011    Past Surgical History:  Procedure Laterality Date   CESAREAN SECTION N/A 05/09/2012   Procedure: Primary CESAREAN SECTION of  baby boy  at 0435 APGAR 9/9;  Surgeon: Lavina Hamman, MD;  Location: [facility];  Service: Obstetrics;  Laterality: N/A;   CESAREAN SECTION N/A 05/18/2013   Procedure: REPEAT CESAREAN SECTION;  Surgeon: Oliver Pila, MD;  Location: [facility];  Service: Obstetrics;  Laterality: N/A;   CESAREAN SECTION N/A 03/16/2016   Procedure: REPEAT CESAREAN SECTION;  Surgeon: Huel Cote, MD;  Location: [facility];  Service: Obstetrics;  Laterality: N/A;  MD requests RNFA   TONSILLECTOMY      OB History     Gravida  5   Para  3   Term  3   Preterm      AB  2   Living  3      SAB  2   IAB      Ectopic      Multiple      Live Births  3            Home Medications    Prior to Admission medications   Medication Sig Start Date End Date Taking? Authorizing Provider  famotidine (PEPCID) 20 MG tablet Take 1 tablet (20 mg total) by mouth 2 (two) times daily. 10/23/21  Yes Lola Lofaro, Para March, NP  acetaminophen (TYLENOL) 325 MG tablet Take 2 tablets (650 mg total) by mouth every 6 (six) hours as needed. Do not take more than 4000mg  of tylenol per day 06/11/17   Couture, Cortni S, PA-C  naproxen (NAPROSYN) 500 MG tablet Take 1 tablet (500 mg total) by  mouth 2 (two) times daily with a meal. 09/08/21   Alcus Dad, MD  paragard intrauterine copper IUD IUD 1 each by Intrauterine route once.    [provider]  levonorgestrel (MIRENA, 52 MG,) 20 MCG/24HR IUD Mirena 20 mcg/24 hours (6 yrs) 52 mg intrauterine device  Take 1 device by intrauterine route.  11/15/19  [provider]  ranitidine (ZANTAC) 150 MG tablet Take 1 tablet (150 mg total) by mouth 2 (two) times daily. Patient not taking: Reported on 01/13/2018 08/11/15 11/15/19  Rasch, Artist Pais, NP    Family History Family History  Problem Relation Age of Onset   Alcohol abuse Father    Diabetes Father    Hypertension Father    Miscarriages / [place] Sister    Clotting disorder Sister    Colon  cancer Neg Hx    Pancreatic cancer Neg Hx    Stomach cancer Neg Hx     Social History Social History   Tobacco Use   Smoking status: Never   Smokeless tobacco: Never  Vaping Use   Vaping Use: Never used  Substance Use Topics   Alcohol use: No   Drug use: No     Allergies   Patient has no known allergies.   Review of Systems Review of Systems  Constitutional:  Negative for chills and fever.  HENT:  Positive for congestion. Negative for ear pain and sore throat.   Eyes:  Negative for pain and visual disturbance.  Respiratory:  Positive for cough. Negative for shortness of breath.   Cardiovascular:  Negative for chest pain and palpitations.  Gastrointestinal:  Positive for abdominal pain. Negative for diarrhea, nausea and vomiting.  Genitourinary:  Negative for dysuria and hematuria.  Musculoskeletal:  Negative for arthralgias and back pain.  Skin:  Negative for color change and rash.  Neurological:  Positive for headaches. Negative for seizures and syncope.  All other systems reviewed and are negative.    Physical Exam Triage Vital Signs ED Triage Vitals  Enc Vitals Group     BP 10/23/21 1841 130/87     Pulse Rate 10/23/21 1841 73     Resp 10/23/21 1841 16     Temp 10/23/21 1841 98.7 F (37.1 C)     Temp Source 10/23/21 1841 Oral     SpO2 10/23/21 1841 100 %     Weight 10/23/21 1839 225 lb (102.1 kg)     Height 10/23/21 1839 5\' 4"  (1.626 m)     Head Circumference --      Peak Flow --      Pain Score 10/23/21 1838 0     Pain Loc --      Pain Edu? --      Excl. in [place]? --    No data found.  Updated Vital Signs BP 130/87 (BP Location: Left Wrist)   Pulse 73   Temp 98.7 F (37.1 C) (Oral)   Resp 16   Ht 5\' 4"  (1.626 m)   Wt 225 lb (102.1 kg)   LMP  (LMP Unknown)   SpO2 100%   BMI 38.62 kg/m   Visual Acuity Right Eye Distance:   Left Eye Distance:   Bilateral Distance:    Right Eye Near:   Left Eye Near:    Bilateral Near:     Physical  Exam Vitals and nursing note reviewed.  Constitutional:      General: She is not in acute distress.    Appearance: She is well-developed and well-groomed.  HENT:     Head: Normocephalic and atraumatic.     Right Ear: Tympanic membrane is retracted.     Left Ear: Tympanic membrane is retracted.     Nose: Nose normal.     Mouth/Throat:     Lips: Pink.     Mouth: Mucous membranes are moist.     Pharynx: Uvula midline. Posterior oropharyngeal erythema present. No uvula swelling.     Tonsils: No tonsillar exudate or tonsillar abscesses.  Eyes:     General: Lids are normal. Vision grossly intact.     Conjunctiva/sclera: Conjunctivae normal.     Pupils: Pupils are equal, round, and reactive to light.  Cardiovascular:     Rate and Rhythm: Normal rate and regular rhythm.     Pulses: Normal pulses.     Heart sounds: Normal heart sounds. No murmur heard. Pulmonary:     Effort: Pulmonary effort is normal. No respiratory distress.     Breath sounds: Normal breath sounds and air entry.  Abdominal:     General: Bowel sounds are normal.     Palpations: Abdomen is soft.     Tenderness: There is abdominal tenderness in the epigastric area.  Musculoskeletal:        General: No swelling.     Cervical back: Neck supple.  Skin:    General: Skin is warm and dry.     Capillary Refill: Capillary refill takes less than 2 seconds.  Neurological:     General: No focal deficit present.     Mental Status: She is alert and oriented to person, place, and time.     GCS: GCS eye subscore is 4. GCS verbal subscore is 5. GCS motor subscore is 6.  Psychiatric:        Attention and Perception: Attention normal.        Mood and Affect: Mood normal.        Speech: Speech normal.        Behavior: Behavior normal. Behavior is cooperative.      UC Treatments / Results  Labs (all labs ordered are listed, but only abnormal results are displayed) Labs Reviewed  CULTURE, GROUP A STREP ([facility])  POC URINE PREG,  ED  POCT URINALYSIS DIPSTICK, ED / UC  POCT RAPID STREP A, ED / UC    EKG   Radiology No results found.  Procedures Procedures (including critical care time)  Medications Ordered in UC Medications  alum & mag hydroxide-simeth (MAALOX/MYLANTA) 200-200-20 MG/5ML suspension 30 mL (30 mLs Oral Given 10/23/21 1908)    Initial Impression / Assessment and Plan / UC Course  I have reviewed the triage vital signs and the nursing notes.  Pertinent labs & imaging results that were available during my care of the patient were reviewed by me and considered in my medical decision making (see chart for details).     Ddx: Viral illness, gastritis Final Clinical Impressions(s) / UC Diagnoses   Final diagnoses:  Viral URI with cough  Acute gastritis without hemorrhage, unspecified gastritis type     Discharge Instructions      Avoid spicy greasy fried foods.  Your urine, pregnancy test, and strep test were negative, strep culture pending.  Please follow-up with your PCP.  Go to emergency room if you have worsening symptoms.     ED Prescriptions     Medication Sig Dispense Auth. Provider   famotidine (PEPCID) 20 MG tablet Take 1 tablet (20 mg total) by mouth 2 (two) times daily. 30  tablet Cornelio Parkerson, Para March, NP      PDMP not reviewed this encounter.   Clancy Gourd, NP 10/23/21 1931    Clancy Gourd, NP 10/23/21 2016

## 2021-10-23 NOTE — Discharge Instructions (Addendum)
Avoid spicy greasy fried foods.  Your urine, pregnancy test, and strep test were negative, strep culture pending.  Please follow-up with your PCP.  Go to emergency room if you have worsening symptoms.

## 2021-10-26 ENCOUNTER — Ambulatory Visit (INDEPENDENT_AMBULATORY_CARE_PROVIDER_SITE_OTHER): Payer: Medicaid Other | Admitting: Family Medicine

## 2021-10-26 VITALS — BP 102/60 | HR 72 | Wt 225.0 lb

## 2021-10-26 DIAGNOSIS — R21 Rash and other nonspecific skin eruption: Secondary | ICD-10-CM | POA: Diagnosis not present

## 2021-10-26 DIAGNOSIS — E785 Hyperlipidemia, unspecified: Secondary | ICD-10-CM

## 2021-10-26 DIAGNOSIS — L989 Disorder of the skin and subcutaneous tissue, unspecified: Secondary | ICD-10-CM

## 2021-10-26 LAB — CULTURE, GROUP A STREP (THRC)

## 2021-10-26 MED ORDER — FERROUS SULFATE 324 MG PO TBEC: 324.0000 mg | DELAYED_RELEASE_TABLET | Freq: Every day | ORAL | 3 refills | Status: AC

## 2021-10-26 NOTE — Patient Instructions (Addendum)
It was so great seeing you today! Today we discussed the following:  - Hole in your forehead, it appears to be nothing to be worried about. It should not grow any larger than it is, but if you became worried about it or if it started draining anything you could come back. There isn't much that we could do to treat this unless we did a biopsy of the area, but that would likely leave a scar there as well.    Please make sure to bring any medications you take to your appointments. If you have any questions or concerns please call the office at [phone].

## 2021-10-26 NOTE — Progress Notes (Signed)
    SUBJECTIVE:   CHIEF COMPLAINT / HPI:   Hole in Forehead - Never drained anything - Not painful and doesn't bother her, worried it will get bigger - Noticed about 4-5 days prior  Previous labs - Patient with multiple questions about prior labs  Rash on left forearm - Mild rash intermittently present for several years - Pruritic without bleeding or erythema - Currently using Aveeno   PERTINENT  PMH / PSH: Reviewed  OBJECTIVE:   BP 102/60   Pulse 72   Wt 225 lb (102.1 kg)   LMP  (LMP Unknown)   SpO2 99%   BMI 38.62 kg/m   General: NAD, well-appearing, well-nourished Respiratory: No respiratory distress, breathing comfortably, able to speak in full sentences Skin: warm and dry, mild scaly rash present on posterior forearm proximal to the wrist with excoriations present. Small scar-like cavern in forehead as pictured below with hair follicle present in the middle     ASSESSMENT/PLAN:   Facial lesion Appears similar to acne scar, the patient denies any acne present in that area.  Follicle present in the center of the lesion, seems very likely a benign finding.  Seems consistent with this plan within a large pore vs follicle.  No intervention necessary at this time - Monitor for signs of infection - Can consider biopsy of the area for a week, did not recommend at this time - Could consider further with dermatology if cosmetically  Left forearm rash Pruritic rash with excoriations, appears very mild at this time.  Unlikely to be psoriasis given lack of history but should consider this.  Does not seem consistent with tinea infection.  Given pruritic nature, recommend trial of over-the-counter steroid cream for - Recommend OTC hydrocortisone cream - Follow-up if worsening  Review of prior labs - Patient with hyperlipidemia on labs 2 months prior, 10-year ASCVD risk is 0.5%.  No need to recheck labs at this time, can consider in the next 4 years - Other labs including CBC  and previous glucose labs were discussed   Marissa Carpenter, [place]

## 2022-12-31 ENCOUNTER — Ambulatory Visit (INDEPENDENT_AMBULATORY_CARE_PROVIDER_SITE_OTHER): Payer: Medicaid Other | Admitting: Student

## 2022-12-31 VITALS — BP 127/82 | HR 87 | Temp 98.0°F | Wt 241.4 lb

## 2022-12-31 DIAGNOSIS — R519 Headache, unspecified: Secondary | ICD-10-CM

## 2022-12-31 MED ORDER — DICLOFENAC SODIUM 1 % EX GEL: 2.0000 g | Freq: Four times a day (QID) | CUTANEOUS | 0 refills | Status: AC

## 2022-12-31 MED ORDER — FLUTICASONE PROPIONATE 50 MCG/ACT NA SUSP: 2.0000 | Freq: Every day | NASAL | 6 refills | Status: AC

## 2022-12-31 NOTE — Progress Notes (Signed)
    SUBJECTIVE:   CHIEF COMPLAINT / HPI: Facial pain  37 year old female with history of allergic sinusitis Presenting today for facial pain Left side facial pain starting from the nasal ridge to the temporal area Some blurry vision with intermittent shadows on visual field.  Endorses head fullness, ringing in the ears bilaterally but not headache Other associated symptoms includes runny nose but no fever, or chills Tried over-the-counter Sudafed with provided mild relief.   PERTINENT  PMH / PSH: Reviewed  OBJECTIVE:   BP 127/82   Pulse 87   Temp 98 F (36.7 C)   Wt 241 lb 6.4 oz (109.5 kg)   SpO2 98%   BMI 41.44 kg/m    Physical Exam General: Alert, well appearing, NAD HEENT: Mouth tympanic membrane bilaterally MMM, oropharyngeal erythema or tonsillar exudates. Left paranasal ridge tenderness.  Pupils equal and reactive to light. Cardiovascular: RRR, No Murmurs, Normal S2/S2 Respiratory: CTAB, No wheezing or Rales Abdomen: No distension or tenderness   ASSESSMENT/PLAN:   Sinusitis Patient's exam finding and constellations of symptoms more consistent with sinusitis.  Doesn't appear toxic on exam and vitals are stable.  Sent in prescription for Flonase and recommend use of Voltaren gel over the tender area. Return precautions discussed with patient   Jerre Simon, MD [facility]

## 2022-12-31 NOTE — Patient Instructions (Addendum)
Great to see you today.  For your facial pain I recommend use of Flonase spray in your nostril use and use of Voltaren gel over the affected area in your nostrils.  I have sent in these medications to your pharmacy.  Continue to do the Sudafed for sinusitis.

## 2023-04-28 ENCOUNTER — Ambulatory Visit: Payer: Self-pay | Admitting: Family Medicine

## 2023-04-30 IMAGING — CR DG ABDOMEN ACUTE W/ 1V CHEST
3 series · 3 of 3 positions shown · non-contrast
Comparison: January 16, 2015

CLINICAL DATA: Nausea and vomiting x1 month with left lower
quadrant abdominal pain.

EXAM:
DG ABDOMEN ACUTE WITH 1 VIEW CHEST

[w chest pa]
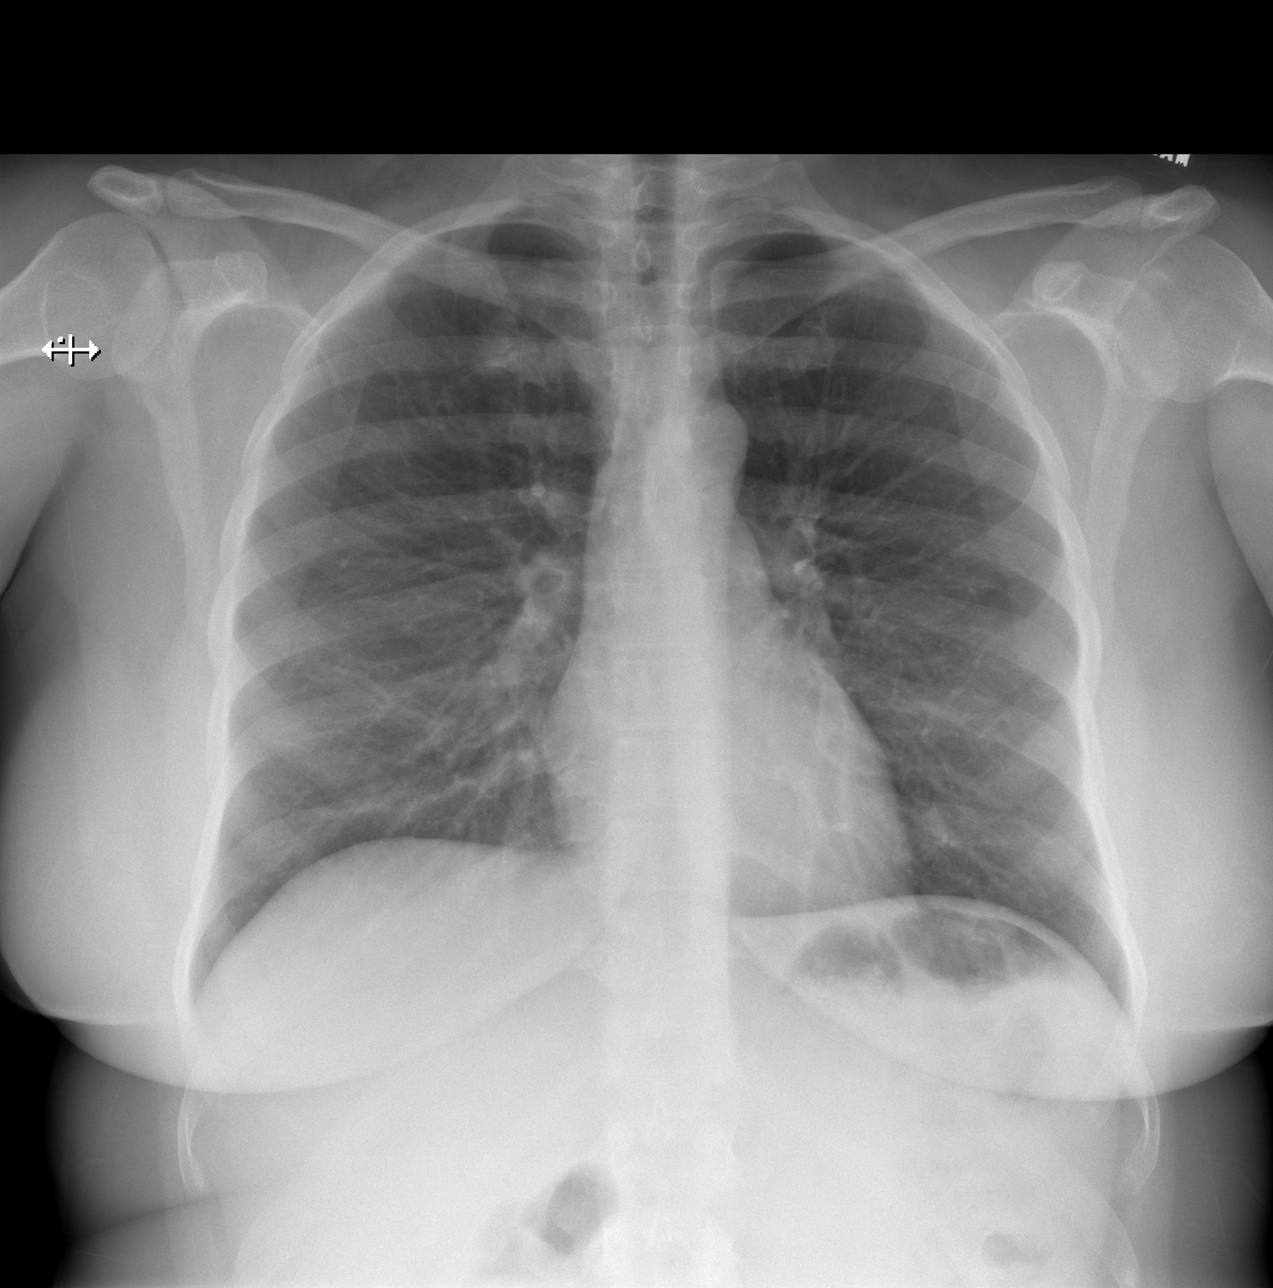

[w abdomen upright]
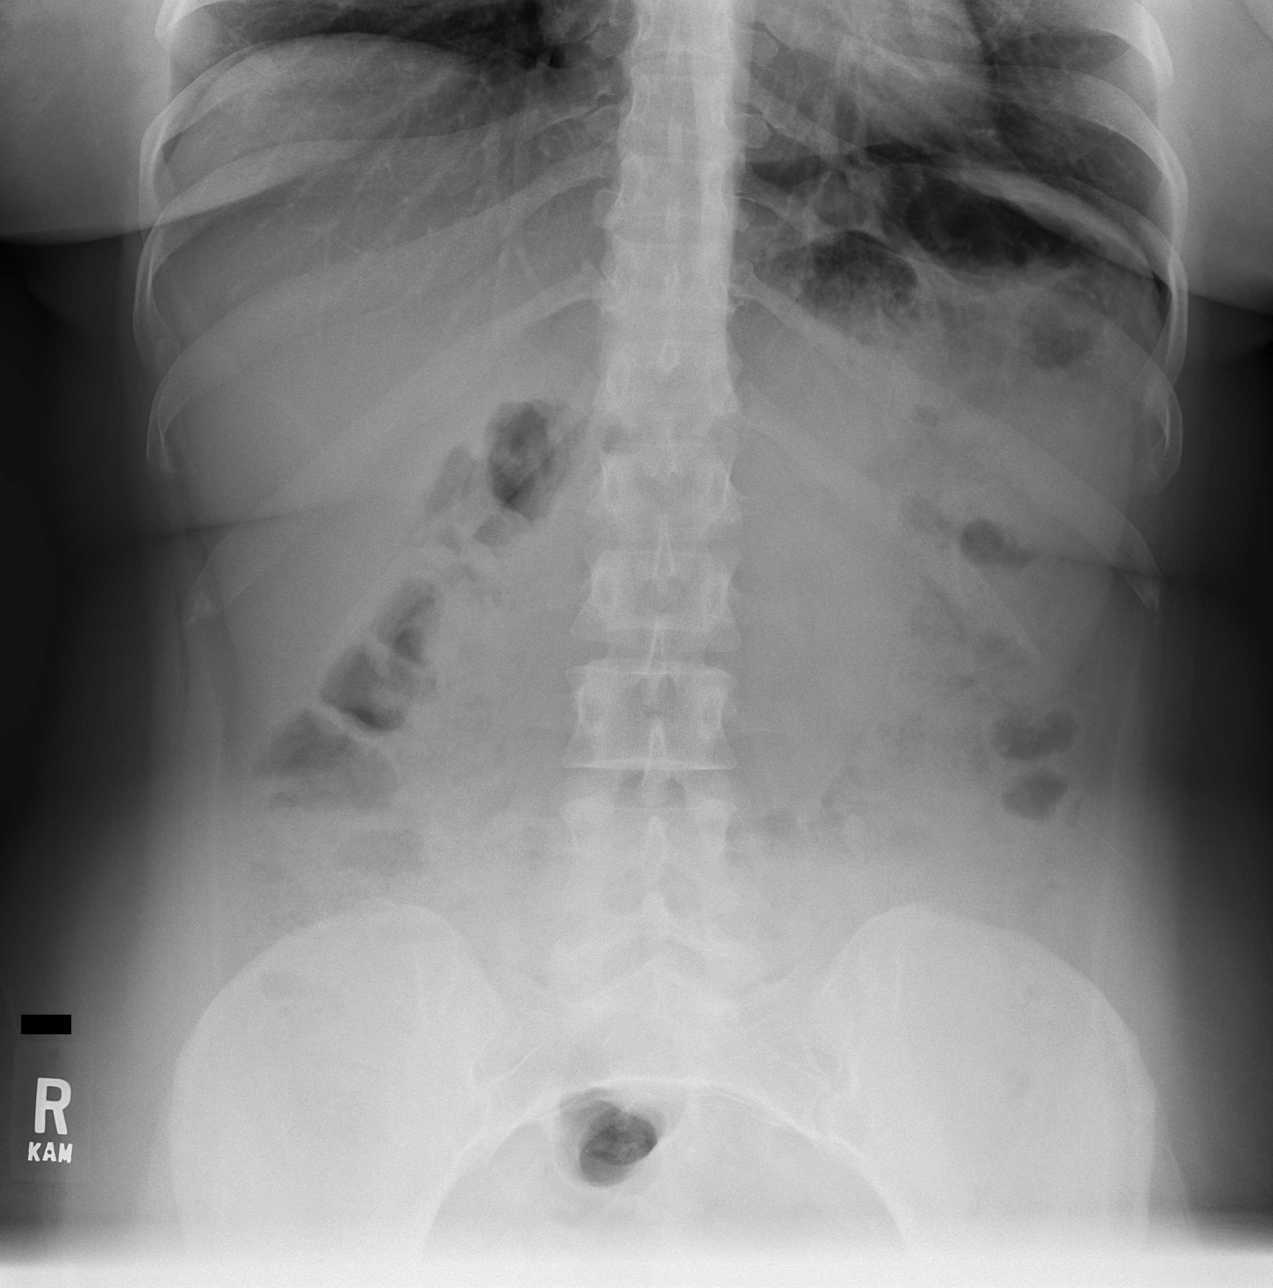

[t abdomen supine]
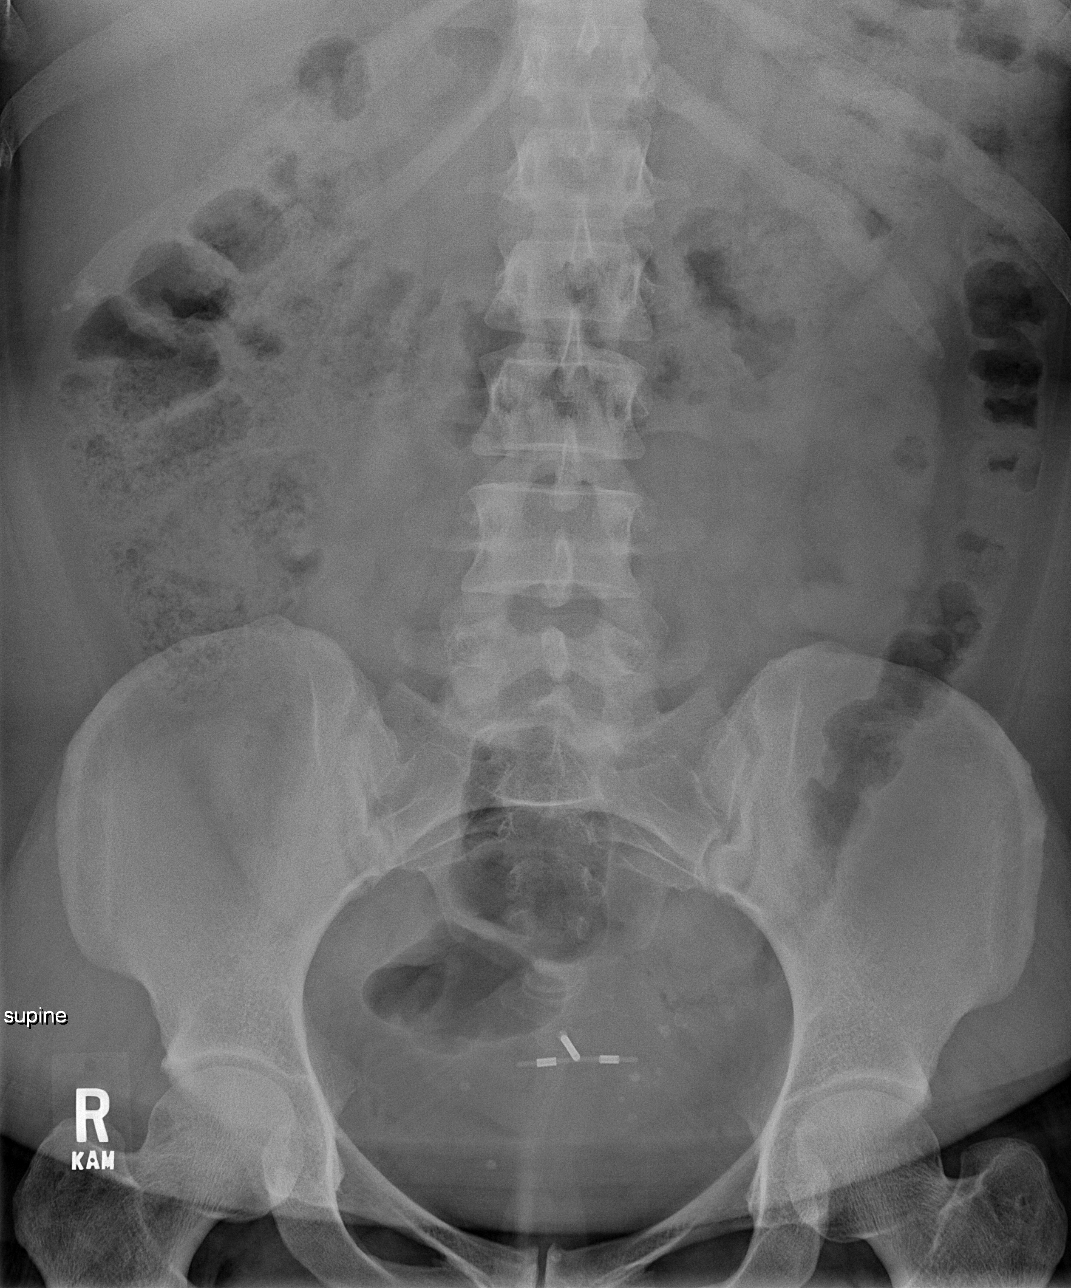

[3 of 3 positions shown; findings below may reference images not displayed]

FINDINGS: There is no evidence of dilated bowel loops or free intraperitoneal
air. A large stool burden is noted. No radiopaque calculi or other
significant radiographic abnormality is seen. An IUD is in place.
Heart size and mediastinal contours are within normal limits. Both
lungs are clear.
IMPRESSION: Negative abdominal radiographs. No acute cardiopulmonary disease.

## 2023-05-01 NOTE — Progress Notes (Unsigned)
    SUBJECTIVE:   CHIEF COMPLAINT / HPI:   Presents to get IUD strings checked Thinks she has Mirena, was placed 6 years ago  Pelvic pain intermittent x2 months, dysuria associated LMP May 19 for 10 days Notes heavy vaginal bleeding with her current IUD  PERTINENT  PMH / PSH: Constipation, hyperlipidemia, obesity  OBJECTIVE:   BP 128/89   Pulse 76   Ht 5\' 4"  (1.626 m)   Wt 245 lb 12.8 oz (111.5 kg)   SpO2 100%   BMI 42.19 kg/m   Pelvic - VULVA: normal appearing vulva with no masses, tenderness or lesions  VAGINA: normal appearing vagina with normal color and discharge, no lesions  CERVIX: normal appearing cervix without discharge or lesions, no CMT. IUD strings visualized  ASSESSMENT/PLAN:   Assessment & Plan Pelvic pain Intermittent for the last 2 months.  Suspect could be related to her menstrual cycles versus UTI given associated dysuria at times.  On chart review she does have the copper IUD which would explain her increased menstrual bleeding.  She does not want to switch contraception methods at this time because she desires a nonhormonal option.  Patient would like to proceed with transvaginal ultrasound - Transvaginal ultrasound scheduled Encounter for routine adult health examination without abnormal findings Patient would like baseline lab work today including blood counts, A1c, lipid panel, TSH - Labs collected Screening examination for STD (sexually transmitted disease) Patient would like to be screened for STDs today, cervical swab obtained for gonorrhea, chlamydia, trichomonas.  HIV drawn     Para March, DO [facility]

## 2023-05-02 ENCOUNTER — Encounter: Payer: Self-pay | Admitting: Family Medicine

## 2023-05-02 ENCOUNTER — Other Ambulatory Visit (HOSPITAL_COMMUNITY)
Admission: RE | Admit: 2023-05-02 | Discharge: 2023-05-02 | Disposition: A | Discharge status: Home / Self Care | Payer: Self-pay | Source: Ambulatory Visit | Attending: Family Medicine | Admitting: Family Medicine

## 2023-05-02 ENCOUNTER — Ambulatory Visit (INDEPENDENT_AMBULATORY_CARE_PROVIDER_SITE_OTHER): Payer: Self-pay | Admitting: Family Medicine

## 2023-05-02 VITALS — BP 128/89 | HR 76 | Ht 64.0 in | Wt 245.8 lb

## 2023-05-02 DIAGNOSIS — E785 Hyperlipidemia, unspecified: Secondary | ICD-10-CM

## 2023-05-02 DIAGNOSIS — Z0000 Encounter for general adult medical examination without abnormal findings: Secondary | ICD-10-CM

## 2023-05-02 DIAGNOSIS — Z131 Encounter for screening for diabetes mellitus: Secondary | ICD-10-CM

## 2023-05-02 DIAGNOSIS — N920 Excessive and frequent menstruation with regular cycle: Secondary | ICD-10-CM

## 2023-05-02 DIAGNOSIS — R102 Pelvic and perineal pain: Secondary | ICD-10-CM

## 2023-05-02 DIAGNOSIS — Z113 Encounter for screening for infections with a predominantly sexual mode of transmission: Secondary | ICD-10-CM | POA: Insufficient documentation

## 2023-05-02 LAB — POCT GLYCOSYLATED HEMOGLOBIN (HGB A1C): Hemoglobin A1C: 5.8 % — AB (ref 4.0–5.6)

## 2023-05-02 NOTE — Patient Instructions (Addendum)
 It was great to see you today! Thank you for choosing [facility] for your primary care.   Today we addressed: Your IUD strings were in place.  It is very common to have heavy vaginal bleeding with a copper IUD. We scheduled a transvaginal ultrasound for you due to your pelvic pain.  We are checking some labs today. I will send you a MyChart message with your results, per your preference. If you do not hear about your labs in the next 2 weeks, please call the office.   If you haven't already, sign up for My Chart to have easy access to your labs results, and communication with your primary care physician.  Call the clinic at [phone] if your symptoms worsen or you have any concerns.  You should return to our clinic Return in about 6 months (around 11/01/2023). Please arrive 15 minutes before your appointment to ensure smooth check in process.  We appreciate your efforts in making this happen.  Thank you for allowing me to participate in your care, Para March, DO 05/02/2023, 11:40 AM PGY-1, [facility]

## 2023-05-03 LAB — CBC
Hematocrit: 35.7 % (ref 34.0–46.6)
Hemoglobin: 11.4 g/dL (ref 11.1–15.9)
MCH: 24.8 pg — ABNORMAL LOW (ref 26.6–33.0)
MCHC: 31.9 g/dL (ref 31.5–35.7)
MCV: 78 fL — ABNORMAL LOW (ref 79–97)
Platelets: 317 10*3/uL (ref 150–450)
RBC: 4.59 x10E6/uL (ref 3.77–5.28)
RDW: 15.9 % — ABNORMAL HIGH (ref 11.7–15.4)
WBC: 7.1 10*3/uL (ref 3.4–10.8)

## 2023-05-03 LAB — CERVICOVAGINAL ANCILLARY ONLY
Chlamydia: NEGATIVE
Comment: NEGATIVE
Comment: NEGATIVE
Comment: NORMAL
Neisseria Gonorrhea: NEGATIVE
Trichomonas: NEGATIVE

## 2023-05-03 LAB — MICROSCOPIC EXAMINATION

## 2023-05-03 LAB — LIPID PANEL
Chol/HDL Ratio: 3.2 ratio (ref 0.0–4.4)
Cholesterol, Total: 200 mg/dL — ABNORMAL HIGH (ref 100–199)
HDL: 62 mg/dL (ref >39)
LDL Chol Calc (NIH): 110 mg/dL — ABNORMAL HIGH (ref 0–99)
Triglycerides: 160 mg/dL — ABNORMAL HIGH (ref 0–149)
VLDL Cholesterol Cal: 28 mg/dL (ref 5–40)

## 2023-05-03 LAB — BASIC METABOLIC PANEL WITH GFR
BUN/Creatinine Ratio: 16 (ref 9–23)
BUN: 10 mg/dL (ref 6–20)
CO2: 25 mmol/L (ref 20–29)
Calcium: 9.1 mg/dL (ref 8.7–10.2)
Chloride: 101 mmol/L (ref 96–106)
Creatinine, Ser: 0.62 mg/dL (ref 0.57–1.00)
Glucose: 87 mg/dL (ref 70–99)
Potassium: 4.4 mmol/L (ref 3.5–5.2)
Sodium: 138 mmol/L (ref 134–144)
eGFR: 118 mL/min/{1.73_m2} (ref >59)

## 2023-05-03 LAB — HIV ANTIBODY (ROUTINE TESTING W REFLEX): HIV Screen 4th Generation wRfx: NONREACTIVE

## 2023-05-04 LAB — URINALYSIS, ROUTINE W REFLEX MICROSCOPIC
Bilirubin, UA: NEGATIVE
Glucose, UA: NEGATIVE
Ketones, UA: NEGATIVE
Nitrite, UA: NEGATIVE
Protein,UA: NEGATIVE
Specific Gravity, UA: 1.015 (ref 1.005–1.030)
Urobilinogen, Ur: 0.2 mg/dL (ref 0.2–1.0)
pH, UA: 5.5 (ref 5.0–7.5)

## 2023-05-04 LAB — MICROSCOPIC EXAMINATION

## 2023-05-09 ENCOUNTER — Ambulatory Visit (HOSPITAL_COMMUNITY): Admission: RE | Admit: 2023-05-09 | Payer: Self-pay | Source: Ambulatory Visit

## 2023-10-05 ENCOUNTER — Ambulatory Visit (INDEPENDENT_AMBULATORY_CARE_PROVIDER_SITE_OTHER): Payer: Self-pay | Admitting: Family Medicine

## 2023-10-05 ENCOUNTER — Encounter: Payer: Self-pay | Admitting: Family Medicine

## 2023-10-05 VITALS — BP 151/64 | HR 74 | Ht 64.0 in | Wt 235.4 lb

## 2023-10-05 DIAGNOSIS — B86 Scabies: Secondary | ICD-10-CM

## 2023-10-05 MED ORDER — PERMETHRIN 5 % EX CREA: 1.0000 | TOPICAL_CREAM | Freq: Once | CUTANEOUS | 0 refills | Status: AC

## 2023-10-05 MED ORDER — TRIAMCINOLONE ACETONIDE 0.1 % EX CREA: 1.0000 | TOPICAL_CREAM | Freq: Two times a day (BID) | CUTANEOUS | 0 refills | Status: AC

## 2023-10-05 NOTE — Progress Notes (Signed)
    SUBJECTIVE:   CHIEF COMPLAINT / HPI:   Rash on both arms Started about 2 months ago Very itchy Has not noticed rash on any other parts of the body No fevers or other symptoms No changes in soaps/detergents/other household products No recent travel No new sexual partners Has 3 kids, states they have not had symptoms Tried vaseline, cortisone cream, has not helped  Flu shot Declines this today. Reviewed importance of this, pt still declines  PERTINENT  PMH / PSH: Eczema, chronic headache, chronic constipation  OBJECTIVE:   BP (!) 151/64   Pulse 74   Ht 5' 4 (1.626 m)   Wt 235 lb 6.4 oz (106.8 kg)   SpO2 97%   BMI 40.41 kg/m   General: Awake and conversant, no acute distress Respiratory: Normal work of breathing on room air Skin: Extensive raised erythematous papules on bilateral upper extremities, spares palms, not adjacent to hair follicle    ASSESSMENT/PLAN:   Assessment & Plan Scabies Pruritic papules on bilateral arms present for 2 months.  No known exposures or contacts with similar symptoms.  Not associated with hair follicle.  Will trial permethrin  and subsequent topical corticosteroid for ongoing pruritus.  Advised washing close and sheets and treating contacts.  Advised follow-up if symptoms worsen or do not improve.     Rea Raring, MD [facility]

## 2023-10-05 NOTE — Patient Instructions (Signed)
 Thank you for coming in today! Here is a summary of what we discussed:  We think your rash is due to scabies.  This will be treated with permethrin  cream- you will use this 1 time, patient to spread all over your arms and even under your fingernails.  You should apply it before you go to bed and then wash it off in the shower the next morning.  To ensure eradication of the scabies you should do the following: -- Make sure any family members who have symptoms are treated as well -- Wash your clothing and bedding with hot water and a hot dry cycle -- Use the triamcinolone  cream to help with the itching that may be present after the permethrin  treatment.  You can use this for up to 2 weeks -- Please let us  know if your symptoms do not improve or get worse  I recommend getting the flu shot as it can prevent you in this around you from getting sick   Please call the clinic at [phone] if your symptoms worsen or you have any concerns.  Best, Dr Adele

## 2024-02-04 ENCOUNTER — Emergency Department (HOSPITAL_BASED_OUTPATIENT_CLINIC_OR_DEPARTMENT_OTHER)
Admission: EM | Admit: 2024-02-04 | Discharge: 2024-02-04 | Disposition: A | Discharge status: Home / Self Care | Payer: Self-pay | Attending: Emergency Medicine | Admitting: Emergency Medicine

## 2024-02-04 ENCOUNTER — Encounter (HOSPITAL_BASED_OUTPATIENT_CLINIC_OR_DEPARTMENT_OTHER): Payer: Self-pay

## 2024-02-04 ENCOUNTER — Other Ambulatory Visit: Payer: Self-pay

## 2024-02-04 DIAGNOSIS — N3090 Cystitis, unspecified without hematuria: Secondary | ICD-10-CM | POA: Insufficient documentation

## 2024-02-04 DIAGNOSIS — J020 Streptococcal pharyngitis: Secondary | ICD-10-CM | POA: Insufficient documentation

## 2024-02-04 LAB — CBC WITH DIFFERENTIAL/PLATELET
Abs Immature Granulocytes: 0.06 K/uL (ref 0.00–0.07)
Basophils Absolute: 0.1 K/uL (ref 0.0–0.1)
Basophils Relative: 0 %
Eosinophils Absolute: 0 K/uL (ref 0.0–0.5)
Eosinophils Relative: 0 %
HCT: 34.4 % — ABNORMAL LOW (ref 36.0–46.0)
Hemoglobin: 11.2 g/dL — ABNORMAL LOW (ref 12.0–15.0)
Immature Granulocytes: 0 %
Lymphocytes Relative: 12 %
Lymphs Abs: 1.7 K/uL (ref 0.7–4.0)
MCH: 24.8 pg — ABNORMAL LOW (ref 26.0–34.0)
MCHC: 32.6 g/dL (ref 30.0–36.0)
MCV: 76.3 fL — ABNORMAL LOW (ref 80.0–100.0)
Monocytes Absolute: 1 K/uL (ref 0.1–1.0)
Monocytes Relative: 7 %
Neutro Abs: 11.5 K/uL — ABNORMAL HIGH (ref 1.7–7.7)
Neutrophils Relative %: 81 %
Platelets: 255 K/uL (ref 150–400)
RBC: 4.51 MIL/uL (ref 3.87–5.11)
RDW: 15.2 % (ref 11.5–15.5)
WBC: 14.4 K/uL — ABNORMAL HIGH (ref 4.0–10.5)
nRBC: 0 % (ref 0.0–0.2)

## 2024-02-04 LAB — URINALYSIS, ROUTINE W REFLEX MICROSCOPIC
Bilirubin Urine: NEGATIVE
Glucose, UA: NEGATIVE mg/dL
Ketones, ur: 15 mg/dL — AB
Nitrite: NEGATIVE
Protein, ur: NEGATIVE mg/dL
Specific Gravity, Urine: 1.012 (ref 1.005–1.030)
pH: 5.5 (ref 5.0–8.0)

## 2024-02-04 LAB — COMPREHENSIVE METABOLIC PANEL WITH GFR
ALT: 14 U/L (ref 0–44)
AST: 13 U/L — ABNORMAL LOW (ref 15–41)
Albumin: 3.9 g/dL (ref 3.5–5.0)
Alkaline Phosphatase: 78 U/L (ref 38–126)
Anion gap: 11 (ref 5–15)
BUN: 7 mg/dL (ref 6–20)
CO2: 25 mmol/L (ref 22–32)
Calcium: 9.2 mg/dL (ref 8.9–10.3)
Chloride: 100 mmol/L (ref 98–111)
Creatinine, Ser: 0.76 mg/dL (ref 0.44–1.00)
GFR, Estimated: >60 mL/min
Glucose, Bld: 115 mg/dL — ABNORMAL HIGH (ref 70–99)
Potassium: 3.4 mmol/L — ABNORMAL LOW (ref 3.5–5.1)
Sodium: 136 mmol/L (ref 135–145)
Total Bilirubin: 0.5 mg/dL (ref 0.0–1.2)
Total Protein: 7.2 g/dL (ref 6.5–8.1)

## 2024-02-04 LAB — RESP PANEL BY RT-PCR (RSV, FLU A&B, COVID)  RVPGX2
Influenza A by PCR: NEGATIVE
Influenza B by PCR: NEGATIVE
Resp Syncytial Virus by PCR: NEGATIVE
SARS Coronavirus 2 by RT PCR: NEGATIVE

## 2024-02-04 LAB — GROUP A STREP BY PCR: Group A Strep by PCR: DETECTED — AB

## 2024-02-04 LAB — PREGNANCY, URINE: Preg Test, Ur: NEGATIVE

## 2024-02-04 LAB — LIPASE, BLOOD: Lipase: 19 U/L (ref 11–51)

## 2024-02-04 MED ORDER — AMOXICILLIN 500 MG PO CAPS: 500.0000 mg | ORAL_CAPSULE | Freq: Three times a day (TID) | ORAL | 0 refills | Status: AC

## 2024-02-04 MED ORDER — AMOXICILLIN 500 MG PO CAPS
500.0000 mg | ORAL_CAPSULE | Freq: Once | ORAL | Status: AC
  Administered 2024-02-04: 500 mg via ORAL
  Filled 2024-02-04: qty 1

## 2024-02-04 MED ORDER — ONDANSETRON 4 MG PO TBDP: 4.0000 mg | ORAL_TABLET | Freq: Three times a day (TID) | ORAL | 0 refills | Status: AC | PRN

## 2024-02-04 NOTE — Discharge Instructions (Addendum)
 Please read and follow all provided instructions.  Your diagnoses today include:  1. Acute streptococcal pharyngitis   2. Cystitis     Tests performed today include: Strep test: was POSITIVE for strep throat Blood cell counts and electrolytes: No concerning findings, elevated infection fighting cell count as expected given your symptoms Flu, COVID, RSV testing: Was negative Urinalysis: Shows some findings suggestive of urinary tract infection, culture sent to confirm.  You will be notified with any necessary changes to antibiotics. Vital signs. See below for your results today.   Medications prescribed:  Amoxicillin  - antibiotic  You have been prescribed an antibiotic medicine: take the entire course of medicine even if you are feeling better. Stopping early can cause the antibiotic not to work.  Zofran  (ondansetron ) - for nausea and vomiting   Take any medications prescribed only as directed.   Home care instructions:  Please read the educational materials provided and follow any instructions contained in this packet.  Follow-up instructions: Please follow-up with your primary care provider as needed for further evaluation of your symptoms.  Return instructions:  Please return to the Emergency Department if you experience worsening symptoms.  Return if you have worsening problems swallowing, your neck becomes swollen, you cannot swallow your saliva or your voice becomes muffled.  Return with high persistent fever, persistent vomiting, or if you have trouble breathing.  Please return with worsening flank pain, persistent vomiting, high fevers that are not improving with treatment. Please return if you have any other emergent concerns.  Additional Information:  Your vital signs today were: BP 107/61   Pulse 96   Temp 100.2 F (37.9 C)   Resp 18   Ht 5' 4 (1.626 m)   Wt 106.8 kg   SpO2 97%   BMI 40.42 kg/m  If your blood pressure (BP) was elevated above 135/85 this  visit, please have this repeated by your doctor within one month. --------------

## 2024-02-04 NOTE — ED Triage Notes (Signed)
 Patient states fever, sore throat, left flank pain for 3 days. Also endorses dysuria.

## 2024-02-04 NOTE — ED Provider Notes (Signed)
 " [facility] EM[facility] AT [facility] Provider Note   CSN: [id number] Arrival date & time: 02/04/24  1040     Patient presents with: Fever   Marissa Carpenter is a 39 y.o. female.   Patient presents to the emergency department today for evaluation of fever, sore throat, body aches, left flank pain with dysuria.  Patient has had symptoms for about 3 days.  She reports fevers and chills at home.  Temperature was 100.2 F on arrival here today.  Patient has generalized bodyaches and nausea without vomiting.  Patient reports left flank pain and dysuria.  Denies previous history of UTI.  No significant cough.  Her throat has been sore and she feels pain in the left neck area.  She has been taking Tylenol  at home.  No difficulty breathing or shortness of breath.       Prior to Admission medications  Medication Sig Start Date End Date Taking? Authorizing Provider  acetaminophen  (TYLENOL ) 325 MG tablet Take 2 tablets (650 mg total) by mouth every 6 (six) hours as needed. Do not take more than 4000mg  of tylenol  per day 06/11/17   Couture, Cortni S, PA-C  famotidine  (PEPCID ) 20 MG tablet Take 1 tablet (20 mg total) by mouth 2 (two) times daily. 10/23/21   Defelice, Rilla, NP  ferrous sulfate  324 MG TBEC Take 1 tablet (324 mg total) by mouth daily with breakfast. 10/26/21   Lilland, Alana, DO  fluticasone  (FLONASE ) 50 MCG/ACT nasal spray Place 2 sprays into both nostrils daily. 12/31/22   Rosendo Norleen BROCKS, MD  paragard intrauterine copper IUD IUD 1 each by Intrauterine route once.    [provider]  triamcinolone  cream (KENALOG ) 0.1 % Apply 1 Application topically 2 (two) times daily. 10/05/23   Adele Song, MD  levonorgestrel (MIRENA, 52 MG,) 20 MCG/24HR IUD Mirena 20 mcg/24 hours (6 yrs) 52 mg intrauterine device  Take 1 device by intrauterine route.  11/15/19  [provider]  ranitidine  (ZANTAC ) 150 MG tablet Take 1 tablet (150 mg total) by mouth 2 (two) times  daily. Patient not taking: Reported on 01/13/2018 08/11/15 11/15/19  Rasch, Delon FERNS, NP    Allergies: Patient has no known allergies.    Review of Systems  Updated Vital Signs BP 124/77 (BP Location: Right Arm)   Pulse (!) 114   Temp 100.2 F (37.9 C)   Resp 15   SpO2 99%   Physical Exam Vitals and nursing note reviewed.  Constitutional:      Appearance: She is well-developed.  HENT:     Head: Normocephalic and atraumatic.     Jaw: No trismus.     Right Ear: Tympanic membrane, ear canal and external ear normal.     Left Ear: Tympanic membrane, ear canal and external ear normal.     Nose: Congestion present. No mucosal edema or rhinorrhea.     Mouth/Throat:     Mouth: Mucous membranes are moist. Mucous membranes are not dry. No oral lesions.     Pharynx: Uvula midline. Posterior oropharyngeal erythema present. No oropharyngeal exudate or uvula swelling.     Tonsils: No tonsillar abscesses.  Eyes:     General:        Right eye: No discharge.        Left eye: No discharge.     Conjunctiva/sclera: Conjunctivae normal.  Cardiovascular:     Rate and Rhythm: Normal rate and regular rhythm.     Heart sounds: Normal heart sounds.  Pulmonary:  Effort: Pulmonary effort is normal. No respiratory distress.     Breath sounds: Normal breath sounds. No wheezing or rales.  Abdominal:     Palpations: Abdomen is soft.     Tenderness: There is no abdominal tenderness. There is no right CVA tenderness, left CVA tenderness, guarding or rebound.  Musculoskeletal:     Cervical back: Normal range of motion and neck supple.  Lymphadenopathy:     Cervical: No cervical adenopathy.  Skin:    General: Skin is warm and dry.  Neurological:     Mental Status: She is alert.  Psychiatric:        Mood and Affect: Mood normal.     (all labs ordered are listed, but only abnormal results are displayed) Labs Reviewed  GROUP A STREP BY PCR - Abnormal; Notable for the following components:       Result Value   Group A Strep by PCR DETECTED (*)    All other components within normal limits  URINALYSIS, ROUTINE W REFLEX MICROSCOPIC - Abnormal; Notable for the following components:   APPearance HAZY (*)    Hgb urine dipstick SMALL (*)    Ketones, ur 15 (*)    Leukocytes,Ua MODERATE (*)    Bacteria, UA RARE (*)    All other components within normal limits  CBC WITH DIFFERENTIAL/PLATELET - Abnormal; Notable for the following components:   WBC 14.4 (*)    Hemoglobin 11.2 (*)    HCT 34.4 (*)    MCV 76.3 (*)    MCH 24.8 (*)    Neutro Abs 11.5 (*)    All other components within normal limits  COMPREHENSIVE METABOLIC PANEL WITH GFR - Abnormal; Notable for the following components:   Potassium 3.4 (*)    Glucose, Bld 115 (*)    AST 13 (*)    All other components within normal limits  RESP PANEL BY RT-PCR (RSV, FLU A&B, COVID)  [id number]  URINE CULTURE  PREGNANCY, URINE  LIPASE, BLOOD    EKG: None  Radiology: No results found.   Procedures   Medications Ordered in the ED  amoxicillin  (AMOXIL ) capsule 500 mg (has no administration in time range)    ED Course  Patient seen and examined. History obtained directly from patient and family member at bedside.  Labs/EKG: Ordered CBC, CMP, lipase, UA, pregnancy.  Also strep testing, influenza/COVID testing.  Imaging: None ordered  Medications/Fluids: None ordered  Most recent vital signs reviewed and are as follows: BP 124/77 (BP Location: Right Arm)   Pulse (!) 114   Temp 100.2 F (37.9 C)   Resp 15   Ht 5' 4 (1.626 m)   Wt 106.8 kg   SpO2 99%   BMI 40.42 kg/m   Initial impression: Sore throat although exam is not too impressive, flank pain plus dysuria raises concern for cystitis versus pyelonephritis as well.  For this reason lab workup has been sent.  12:46 PM Reassessment performed. Patient appears comfortable.  Labs personally reviewed and interpreted including: CBC WBC 14.4, hemoglobin shows mild anemia  11.2; CMP unremarkable; lipase normal; strep testing was positive; oral panel was negative for flu, COVID, RSV; UA shows white blood cells 11-20, clean-catch; pregnancy negative.  Reviewed pertinent lab work and imaging with patient and family at bedside. Questions answered.   Most current vital signs reviewed and are as follows: BP 124/77 (BP Location: Right Arm)   Pulse (!) 114   Temp 100.2 F (37.9 C)   Resp 15  Ht 5' 4 (1.626 m)   Wt 106.8 kg   SpO2 99%   BMI 40.42 kg/m   Plan: Awaiting recheck of vital signs.  Will plan to treat strep throat, possible UTI.  2:09 PM Reassessment performed. Patient appears stable.  Urine culture sent.  Reviewed pertinent lab work and imaging with patient at bedside. Questions answered.   Most current vital signs reviewed and are as follows: BP 107/61   Pulse 96   Temp 100.2 F (37.9 C)   Resp 18   Ht 5' 4 (1.626 m)   Wt 106.8 kg   SpO2 97%   BMI 40.42 kg/m   Plan: Discharge to home.   Prescriptions written for: Amoxicillin , Zofran   Other home care instructions discussed: Maintain good hydration, OTC meds for pain and fever  ED return instructions discussed: Return with difficulty swallowing, persistent vomiting, worsening flank pain  Follow-up instructions discussed: Patient encouraged to follow-up with their PCP in 3 days.                                   Medical Decision Making Amount and/or Complexity of Data Reviewed Labs: ordered.   Patient presents with fever, malaise.  In regards to the patient's sore throat today, the following dangerous and potentially life threatening etiologies were considered on the differential diagnosis: Lugwig's angina, uvulitis, epiglottis, peritonsillar abscess, retropharyngeal abscess, Lemierre's syndrome. Also considered were more common causes such as: streptococcal pharyngitis, gonococcal pharyngitis, non-bacterial pharyngitis (cold viruses, HSV/coxsackievirus, influenza, COVID-19,  infectious mononucleosis, oropharyngeal candidiasis), and other non-infectious causes including seasonal allergies/post-nasal drip, GERD/esophagitis, trauma.   No sign of peritonsillar abscess on exam.  Patient maintaining her airway.  She tested positive for strep.  Patient also with dysuria and some flank pain.  URI with some white cells, culture sent and pending.  Will treat for cystitis.  Lower concern for pyelonephritis at this time.  The patient's vital signs, pertinent lab work and imaging were reviewed and interpreted as discussed in the ED course. Hospitalization was considered for further testing, treatments, or serial exams/observation. However as patient is well-appearing, has a stable exam, and reassuring studies today, I do not feel that they warrant admission at this time. This plan was discussed with the patient who verbalizes agreement and comfort with this plan and seems reliable and able to return to the Emergency Department with worsening or changing symptoms.       Final diagnoses:  Acute streptococcal pharyngitis  Cystitis    ED Discharge Orders          Ordered    amoxicillin  (AMOXIL ) 500 MG capsule  3 times daily        02/04/24 1407    ondansetron  (ZOFRAN -ODT) 4 MG disintegrating tablet  Every 8 hours PRN        02/04/24 1407               Trygve Thal, PA-C 02/04/24 1417  "

## 2024-02-05 LAB — URINE CULTURE: Culture: <10000 — AB
